# Patient Record
Sex: Female | Born: 1960
Health system: Southern US, Community
[De-identification: ages and names within clinical notes are randomized; demographics above are authoritative.]

## PROBLEM LIST (undated history)

## (undated) ENCOUNTER — Inpatient Hospital Stay (HOSPITAL_COMMUNITY): Payer: Self-pay

## (undated) DIAGNOSIS — T7840XA Allergy, unspecified, initial encounter: Secondary | ICD-10-CM

## (undated) DIAGNOSIS — Z9889 Other specified postprocedural states: Secondary | ICD-10-CM

## (undated) DIAGNOSIS — R7303 Prediabetes: Secondary | ICD-10-CM

## (undated) DIAGNOSIS — B009 Herpesviral infection, unspecified: Secondary | ICD-10-CM

## (undated) DIAGNOSIS — H9319 Tinnitus, unspecified ear: Secondary | ICD-10-CM

## (undated) DIAGNOSIS — R079 Chest pain, unspecified: Secondary | ICD-10-CM

## (undated) DIAGNOSIS — J302 Other seasonal allergic rhinitis: Secondary | ICD-10-CM

## (undated) DIAGNOSIS — E669 Obesity, unspecified: Secondary | ICD-10-CM

## (undated) DIAGNOSIS — Z8661 Personal history of infections of the central nervous system: Secondary | ICD-10-CM

## (undated) DIAGNOSIS — J45909 Unspecified asthma, uncomplicated: Secondary | ICD-10-CM

## (undated) DIAGNOSIS — M722 Plantar fascial fibromatosis: Secondary | ICD-10-CM

## (undated) DIAGNOSIS — R253 Fasciculation: Secondary | ICD-10-CM

## (undated) DIAGNOSIS — K8020 Calculus of gallbladder without cholecystitis without obstruction: Secondary | ICD-10-CM

## (undated) DIAGNOSIS — I493 Ventricular premature depolarization: Secondary | ICD-10-CM

## (undated) DIAGNOSIS — J45901 Unspecified asthma with (acute) exacerbation: Secondary | ICD-10-CM

## (undated) DIAGNOSIS — I10 Essential (primary) hypertension: Secondary | ICD-10-CM

## (undated) DIAGNOSIS — Z8619 Personal history of other infectious and parasitic diseases: Secondary | ICD-10-CM

## (undated) DIAGNOSIS — K859 Acute pancreatitis without necrosis or infection, unspecified: Secondary | ICD-10-CM

## (undated) DIAGNOSIS — M1990 Unspecified osteoarthritis, unspecified site: Secondary | ICD-10-CM

## (undated) DIAGNOSIS — K219 Gastro-esophageal reflux disease without esophagitis: Secondary | ICD-10-CM

## (undated) HISTORY — DX: Herpesviral infection, unspecified: B00.9

## (undated) HISTORY — DX: Prediabetes: R73.03

## (undated) HISTORY — DX: Gastro-esophageal reflux disease without esophagitis: K21.9

## (undated) HISTORY — DX: Tinnitus, unspecified ear: H93.19

## (undated) HISTORY — PX: INGUINAL HERNIA REPAIR: SUR1180

## (undated) HISTORY — DX: Acute pancreatitis without necrosis or infection, unspecified: K85.90

## (undated) HISTORY — DX: Unspecified asthma with (acute) exacerbation: J45.901

## (undated) HISTORY — DX: Plantar fascial fibromatosis: M72.2

## (undated) HISTORY — DX: Allergy, unspecified, initial encounter: T78.40XA

## (undated) HISTORY — DX: Unspecified osteoarthritis, unspecified site: M19.90

## (undated) HISTORY — DX: Fasciculation: R25.3

## (undated) HISTORY — DX: Personal history of infections of the central nervous system: Z86.61

## (undated) HISTORY — DX: Calculus of gallbladder without cholecystitis without obstruction: K80.20

## (undated) HISTORY — DX: Personal history of other infectious and parasitic diseases: Z86.19

## (undated) HISTORY — DX: Unspecified asthma, uncomplicated: J45.909

## (undated) HISTORY — PX: MOUTH SURGERY: SHX715

---
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
## 1990-08-31 HISTORY — PX: APPENDECTOMY: SHX54

## 1999-09-01 HISTORY — PX: DILATION AND CURETTAGE OF UTERUS: SHX78

## 2009-10-09 ENCOUNTER — Ambulatory Visit: Payer: Self-pay | Admitting: Internal Medicine

## 2009-10-09 DIAGNOSIS — I10 Essential (primary) hypertension: Secondary | ICD-10-CM | POA: Insufficient documentation

## 2009-10-09 DIAGNOSIS — H9319 Tinnitus, unspecified ear: Secondary | ICD-10-CM | POA: Insufficient documentation

## 2009-10-09 DIAGNOSIS — J45909 Unspecified asthma, uncomplicated: Secondary | ICD-10-CM | POA: Insufficient documentation

## 2009-10-09 DIAGNOSIS — O09529 Supervision of elderly multigravida, unspecified trimester: Secondary | ICD-10-CM | POA: Insufficient documentation

## 2009-10-09 DIAGNOSIS — J301 Allergic rhinitis due to pollen: Secondary | ICD-10-CM | POA: Insufficient documentation

## 2009-10-09 HISTORY — DX: Tinnitus, unspecified ear: H93.19

## 2009-10-09 HISTORY — DX: Unspecified asthma, uncomplicated: J45.909

## 2009-10-16 ENCOUNTER — Encounter (INDEPENDENT_AMBULATORY_CARE_PROVIDER_SITE_OTHER): Payer: Self-pay | Admitting: *Deleted

## 2009-11-22 ENCOUNTER — Ambulatory Visit: Payer: Self-pay | Admitting: Internal Medicine

## 2010-04-24 ENCOUNTER — Emergency Department (HOSPITAL_COMMUNITY): Admission: EM | Admit: 2010-04-24 | Discharge: 2010-04-25 | Payer: Self-pay | Admitting: Emergency Medicine

## 2010-05-20 ENCOUNTER — Ambulatory Visit: Payer: Self-pay | Admitting: Internal Medicine

## 2010-05-20 ENCOUNTER — Encounter (INDEPENDENT_AMBULATORY_CARE_PROVIDER_SITE_OTHER): Payer: Self-pay | Admitting: *Deleted

## 2010-05-20 ENCOUNTER — Telehealth: Payer: Self-pay | Admitting: Internal Medicine

## 2010-05-20 DIAGNOSIS — J209 Acute bronchitis, unspecified: Secondary | ICD-10-CM | POA: Insufficient documentation

## 2010-05-20 DIAGNOSIS — J45901 Unspecified asthma with (acute) exacerbation: Secondary | ICD-10-CM | POA: Insufficient documentation

## 2010-07-10 ENCOUNTER — Encounter: Payer: Self-pay | Admitting: Internal Medicine

## 2010-08-13 ENCOUNTER — Ambulatory Visit: Payer: Self-pay | Admitting: Internal Medicine

## 2010-08-13 ENCOUNTER — Encounter (INDEPENDENT_AMBULATORY_CARE_PROVIDER_SITE_OTHER): Payer: Self-pay | Admitting: *Deleted

## 2010-09-30 ENCOUNTER — Ambulatory Visit
Admission: RE | Admit: 2010-09-30 | Discharge: 2010-09-30 | Payer: Self-pay | Source: Home / Self Care | Attending: Internal Medicine | Admitting: Internal Medicine

## 2010-09-30 DIAGNOSIS — M722 Plantar fascial fibromatosis: Secondary | ICD-10-CM | POA: Insufficient documentation

## 2010-09-30 HISTORY — DX: Plantar fascial fibromatosis: M72.2

## 2010-09-30 NOTE — Letter (Signed)
Summary: Physical Statement & Health Status/[facility]  Physical Statement & Health Status/[facility]   Imported By: Sherian Rein 07/14/2010 11:40:11  _____________________________________________________________________  External Attachment:    Type:   Image     Comment:   External Document

## 2010-09-30 NOTE — Letter (Signed)
Summary: [facility]  [address] [place], [place] [address]   Phone: [phone]  Fax: [phone]    Today's Date: May 20, 2010  Name of Patient: Sheila Nelson  The above named patient had a medical visit today 05/20/10  Please take this into consideration when reviewing the time away from work  Special Instructions:  [  ] None    Sincerely yours,  Dr. Rene Paci

## 2010-09-30 NOTE — Assessment & Plan Note (Signed)
Summary: NEW / [employer] / # / CD   Vital Signs:  Patient profile:   50 year old female Height:      64 inches (162.56 cm) Weight:      206.8 pounds (94 kg) BMI:     35.63 O2 Sat:      97 % on Room air Temp:     98.3 degrees F (36.83 degrees C) oral Pulse rate:   78 / minute BP sitting:   112 / 80  (left arm) Cuff size:   large  Vitals Entered By: Orlan Leavens (October 09, 2009 3:07 PM)  O2 Flow:  Room air CC: New patient Is Patient Diabetic? No Pain Assessment Patient in pain? no        Primary Care Provider:  Newt Lukes MD  CC:  New patient.  History of Present Illness: new pt to me and our practice - here to est care  1) c/o recurrent bronchitis symptoms  occurs 2-3x/yr - requiring steroids and Alb MDI during flare last flare summer 2010 no daily wheezing or DOE/SOB  2) seasonal allg rhinitis - uses as needed claritin with good results characterized as head and nasla congestion  3) c/o tinnitus - occurs while trying to fall asleep only not unilateral and not pulsating occurs most nights of the week symptoms worse with stress generally triggered by work hx "early hearing loss" detected years ago at end of service duty with [employer] - no f/u since  4) ?gyn care- actively pursuing pregnancy at this time - ?need high risk care d/t AMA if successful  Preventive Screening-Counseling & Management  Alcohol-Tobacco     Alcohol drinks/day: <1     Alcohol Counseling: not indicated; use of alcohol is not excessive or problematic     Smoking Status: never     Tobacco Counseling: not indicated; no tobacco use  Caffeine-Diet-Exercise     Does Patient Exercise: yes     Exercise Counseling: to improve exercise regimen     Depression Counseling: not indicated; screening negative for depression  Safety-Violence-Falls     Seat Belt Use: yes     Firearms in the Home: no firearms in the home     Smoke Detectors: yes     Violence in the Home: no risk noted  Fall Risk Counseling: not indicated; no significant falls noted  Current Medications (verified): 1)  Multivitamins  Tabs (Multiple Vitamin) .... Once Daily 2)  Claritin 10 Mg Tabs (Loratadine) .... 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  The patient denies fever, decreased hearing, hoarseness, syncope, dyspnea on exertion, peripheral edema, and headaches.    Physical Exam  General:  overweight-appearing.  alert, well-developed, well-nourished, and cooperative to examination.    Eyes:  vision grossly intact; pupils equal, round and reactive to light.  conjunctiva and lids normal.    Ears:  normal pinnae  bilaterally, without erythema, swelling, or tenderness to palpation. TMs clear, without effusion, or cerumen impaction. Hearing grossly normal bilaterally  Mouth:  teeth and gums in good repair; mucous membranes moist, without lesions or ulcers. oropharynx clear without exudate, no erythema.  Lungs:  normal respiratory effort, no intercostal retractions or use of accessory muscles; normal breath sounds bilaterally - no crackles and no wheezes.    Heart:  normal rate, regular rhythm, no murmur, and no rub. BLE without edema. Psych:  Oriented X3, memory intact for recent and remote, normally interactive, good eye contact, not anxious  appearing, not depressed appearing, and not agitated.      Impression & Recommendations:  Problem # 1:  REACTIVE AIRWAY DISEASE (ICD-493.90) seasonal flares - usually triggered by infx - keep as needed MDI on hand and to call if problems Her updated medication list for this problem includes:    Xopenex Hfa 45 Mcg/act Aero (Levalbuterol tartrate) .Marland Kitchen... 1-2 inhalations every 4 hours as needed for shortness of breath  Problem # 2:  ALLERGIC RHINITIS, SEASONAL (ICD-477.0) cont as needed claritin - no acute issues  Problem # 3:  TINNITUS (ICD-388.30)  exam benign refer for audiology testing now - note hx "early hearing loss" years ago upon end of active duty with [employer]  Orders: Audiology (Audio)  Problem # 4:  other will refer to gyn for PAP/pelvic and review of ?fertility and possible high risk pregnancy as pt trying to become pregnant at this time  Complete Medication List: 1)  Multivitamins Tabs (Multiple vitamin) .... Once daily 2)  Claritin 10 Mg Tabs (Loratadine) .... Use prn 3)  Xopenex Hfa 45 Mcg/act Aero (Levalbuterol tartrate) .Marland Kitchen.. 1-2 inhalations every 4 hours as needed for shortness of breath  Other Orders: Gynecologic Referral (Gyn)  Patient Instructions: 1)  it was good to see you today. 2)  prescription for as needed xopenex MDI to have on hand - 3)  we'll make referral to gynecology and audiology. Our office will contact you regarding these appointments once made.  4)  Please schedule a follow-up appointment annually for medical physcial and labs, sooner if problems.  Prescriptions: XOPENEX HFA 45 MCG/ACT AERO (LEVALBUTEROL TARTRATE) 1-2 inhalations every 4 hours as needed for shortness of breath  #1 x 3   Entered and Authorized by:   Newt Lukes MD   Signed by:   Newt Lukes MD on 10/09/2009   Method used:   Print then Give to Patient   RxID:   [id number]    Pap Smear  Procedure date:  09/01/2007  Findings:       Interpretation/Result:Negative for intraepithelial Lesion or Malignancy.       Immunization History:  Tetanus/Td Immunization History:    Tetanus/Td:  historical (08/31/2008)  Influenza Immunization History:    Influenza:  historical (05/31/2009)

## 2010-09-30 NOTE — Progress Notes (Signed)
Summary: Appt  Phone Note Call from Patient   Caller: Patient [phone] Summary of Call: Pt called requesting appt for Asthma sxs. Please schedule pt with VAL tomorrow morning, Thanks. Initial call taken by: Margaret Pyle, CMA,  May 20, 2010 10:52 AM  Follow-up for Phone Call        Pt has schedule appt today @ 2:45pm Follow-up by: Orlan Leavens RMA,  May 20, 2010 11:15 AM

## 2010-09-30 NOTE — Letter (Signed)
Summary: [facility] Consult Scheduled Letter  [name]  [address] [place], [place] [address]   Phone: [phone]  Fax: [phone]      10/16/2009 MRN: [id number]  Sheila Nelson [address] [place], [place]  [address]    Dear Ms. [name],      We have scheduled an appointment for you.  At the recommendation of Dr.Leschber, we have scheduled you a consult with Dr Billy Coast on 11/14/09 at 2:00pm.  Their phone number is [phone].  If this appointment day and time is not convenient for you, please feel free to call the office of the doctor you are being referred to at the number listed above and reschedule the appointment.    [facility] [address] [place], [place] [address]    Thank you,  Patient Care Coordinator [facility]

## 2010-09-30 NOTE — Assessment & Plan Note (Signed)
Summary: asthma,cough cold/cd   Vital Signs:  Patient profile:   50 year old female Height:      64 inches (162.56 cm) Weight:      206 pounds (93.64 kg) O2 Sat:      96 % on Room air Temp:     98.7 degrees F (37.06 degrees C) oral Pulse rate:   86 / minute BP sitting:   102 / 84  (left arm) Cuff size:   large  Vitals Entered By: Orlan Leavens RMA (May 20, 2010 2:50 PM)  O2 Flow:  Room air CC: ? Asthma/ cold sxs Is Patient Diabetic? No Pain Assessment Patient in pain? no        Primary Care Provider:  Newt Lukes MD  CC:  ? Asthma/ cold sxs.  History of Present Illness: here for asthma flare onset 4 days ago - started scratch in throat - progressed into PND and cough now a/w chest tightness- improved but not resolved with xopinex mdi ?other suppressive allg/asthma med - denies wheeze but +productive sputum with cough no fever but +sweats and chills hx same - last URI 6 weeks ago  reviewed prior OV 10/2009: 1) c/o recurrent bronchitis symptoms  occurs 2-3x/yr - requiring steroids and Alb MDI during flare last flare summer 2010 no daily wheezing or DOE/SOB  2) seasonal allg rhinitis - uses as needed claritin with good results characterized as head and nasal congestion  3) c/o tinnitus - occurs while trying to fall asleep only not unilateral and not pulsating occurs most nights of the week symptoms worse with stress generally triggered by work hx "early hearing loss" detected years ago at end of service duty with [employer] - no f/u since  4) ?gyn care- actively pursuing pregnancy at this time - ?need high risk care d/t AMA if successful  Clinical Review Panels:  Immunizations   Last Tetanus Booster:  Historical (08/31/2008)   Last Flu Vaccine:  Historical (05/31/2009)   Current Medications (verified): 1)  Multivitamins  Tabs (Multiple Vitamin) .... Once Daily 2)  Claritin 10 Mg Tabs (Loratadine) .... Use Prn 3)  Xopenex Hfa 45 Mcg/act Aero  (Levalbuterol Tartrate) .Marland Kitchen.. 1-2 Inhalations Every 4 Hours As Needed For Shortness of Breath  Allergies (verified): No Known Drug Allergies  Past History:  Past Medical History: hypertension asthma allergic rhinitis  Social History: Never Smoked social alcohol - wine lives with finance - wedding 07/2010 planned employed as RN - prev pediatric critical care; now [employer]  Review of Systems  The patient denies fever and hemoptysis.    Physical Exam  General:  overweight-appearing.  alert, well-developed, well-nourished, and cooperative to examination.   mod ill appearing Eyes:  vision grossly intact; pupils equal, round and reactive to light.  conjunctiva and lids normal.    Ears:  normal pinnae bilaterally, without erythema, swelling, or tenderness to palpation. TMs clear, without effusion, or cerumen impaction. Hearing grossly normal bilaterally  Mouth:  teeth and gums in good repair; mucous membranes moist, without lesions or ulcers. oropharynx clear without exudate, mod erythema. clear PND Lungs:  normal respiratory effort, no intercostal retractions or use of accessory muscles; normal breath sounds bilaterally - no crackles and min end exp wheezes.    Heart:  normal rate, regular rhythm, no murmur, and no rub. BLE without edema.   Impression & Recommendations:  Problem # 1:  ASTHMA UNSPECIFIED WITH EXACERBATION (ICD-493.92)  tx acute flare with zpack for bronchitis and steroids add singular - if  cont symptoms after resolution of acute infx, consider PFTs to eval need for inhaled steroid or other pulm tx  Her updated medication list for this problem includes:    Xopenex Hfa 45 Mcg/act Aero (Levalbuterol tartrate) .Marland Kitchen... 1-2 inhalations every 4 hours as needed for shortness of breath    Prednisone (pak) 10 Mg Tabs (Prednisone) .Marland Kitchen... As directed x 6 days    Singulair 10 Mg Tabs (Montelukast sodium) .Marland Kitchen... 1 by mouth once daily  Pulmonary Functions Reviewed: O2 sat: 96  (05/20/2010)  Orders: Prescription Created Electronically [phone])  Problem # 2:  ACUTE BRONCHITIS (ICD-466.0)  Her updated medication list for this problem includes:    Xopenex Hfa 45 Mcg/act Aero (Levalbuterol tartrate) .Marland Kitchen... 1-2 inhalations every 4 hours as needed for shortness of breath    Azithromycin 250 Mg Tabs (Azithromycin) .Marland Kitchen... 2 tabs by mouth today, then 1 by mouth daily starting tomorrow    Singulair 10 Mg Tabs (Montelukast sodium) .Marland Kitchen... 1 by mouth once daily    Tussionex Pennkinetic Er 10-8 Mg/16ml Lqcr (Hydrocod polst-chlorphen polst) .Marland KitchenMarland KitchenMarland KitchenMarland Kitchen 5 cc by mouth every 12h as needed for cough  Take antibiotics and other medications as directed. Encouraged to push clear liquids, get enough rest, and take acetaminophen as needed. To be seen in 5-7 days if no improvement, sooner if worse.  Orders: Prescription Created Electronically [phone])  Complete Medication List: 1)  Claritin 10 Mg Tabs (Loratadine) .... Use prn 2)  Xopenex Hfa 45 Mcg/act Aero (Levalbuterol tartrate) .Marland Kitchen.. 1-2 inhalations every 4 hours as needed for shortness of breath 3)  Azithromycin 250 Mg Tabs (Azithromycin) .... 2 tabs by mouth today, then 1 by mouth daily starting tomorrow 4)  Prednisone (pak) 10 Mg Tabs (Prednisone) .... As directed x 6 days 5)  Singulair 10 Mg Tabs (Montelukast sodium) .Marland Kitchen.. 1 by mouth once daily 6)  Tussionex Pennkinetic Er 10-8 Mg/37ml Lqcr (Hydrocod polst-chlorphen polst) .... 5 cc by mouth every 12h as needed for cough 7)  Mupirocin 2 % Oint (Mupirocin) .... Apply two times a day as needed  Patient Instructions: 1)  it was good to see you today. 2)  Zpack and pred pack + tussionex for acute bronchitis symptoms as discussed - 3)  add singular once daily for asthma/allergy symptoms  4)  continue xopinex as needed 5)  Get plenty of rest, drink lots of clear liquids, and use Tylenol or Ibuprofen for fever and comfort. Return in 7-10 days if you're not better:sooner if you're feeling  worse. 6)  work note for today as requested 7)  Please schedule a follow-up appointment in 3 months to review allergy and asthma control, call sooner if problems.  Prescriptions: XOPENEX HFA 45 MCG/ACT AERO (LEVALBUTEROL TARTRATE) 1-2 inhalations every 4 hours as needed for shortness of breath  #1 x 3   Entered and Authorized by:   Newt Lukes MD   Signed by:   Newt Lukes MD on 05/20/2010   Method used:   Electronically to        [facility]* (retail)       [address]       [place], [place]  [address]       Ph: [phone]       Fax: [phone]   RxID:   [id number] BACTROBAN 2 % CREA (MUPIROCIN CALCIUM) apply to affected skin two times a day as needed  #1 x 0   Entered and Authorized by:   Newt Lukes MD   Signed  by:   Newt Lukes MD on 05/20/2010   Method used:   Electronically to        [facility]* (retail)       [address]       [place], [place]  [id number]       Ph: [phone]       Fax: [phone]   RxID:   [id number] Sandria Senter ER 10-8 MG/5ML LQCR (HYDROCOD POLST-CHLORPHEN POLST) 5 cc by mouth every 12h as needed for cough  #6 oz x 0   Entered and Authorized by:   Newt Lukes MD   Signed by:   Newt Lukes MD on 05/20/2010   Method used:   Printed then faxed to ...       [facility]* (retail)       [address]       [place], [place]  [address]       Ph: [phone]       Fax: [phone]   RxID:   [id number] SINGULAIR 10 MG TABS (MONTELUKAST SODIUM) 1 by mouth once daily  #30 x 6   Entered and Authorized by:   Newt Lukes MD   Signed by:   Newt Lukes MD on 05/20/2010   Method used:   Electronically to        [facility]* (retail)       [address]       [place], [place]  [address]       Ph: [phone]       Fax: [phone]   RxID:   [id number] PREDNISONE (PAK) 10 MG TABS (PREDNISONE) as directed x 6  days  #1 x 0   Entered and Authorized by:   Newt Lukes MD   Signed by:   Newt Lukes MD on 05/20/2010   Method used:   Electronically to        [facility]* (retail)       [address]       [place], [place]  [address]       Ph: [phone]       Fax: [phone]   RxID:   [id number] AZITHROMYCIN 250 MG TABS (AZITHROMYCIN) 2 tabs by mouth today, then 1 by mouth daily starting tomorrow  #6 x 0   Entered and Authorized by:   Newt Lukes MD   Signed by:   Newt Lukes MD on 05/20/2010   Method used:   Electronically to        [facility]* (retail)       [address]       [place], [place]  [address]       Ph: [phone]       Fax: [phone]   RxID:   [id number]

## 2010-09-30 NOTE — Progress Notes (Signed)
Summary: med change  ---- Converted from flag ---- ---- 05/20/2010 3:43 PM, Verdell Face wrote: Valentina Gu,  Pharmacist can save pt $40 if cream just prescribed can be changed to  ointment instead of cream - [phone] Nehemiah Settle at [facility].   Elnita Maxwell ------------------------------  Phone Note Call from Patient   Summary of Call: ok to change to ointment - erx done Initial call taken by: Newt Lukes MD,  May 20, 2010 4:13 PM  Follow-up for Phone Call        notified pt &  pharm rx sent e-script Follow-up by: Orlan Leavens RMA,  May 20, 2010 4:24 PM    New/Updated Medications: MUPIROCIN 2 % OINT (MUPIROCIN) apply two times a day as needed Prescriptions: MUPIROCIN 2 % OINT (MUPIROCIN) apply two times a day as needed  #1 x 0   Entered and Authorized by:   Newt Lukes MD   Signed by:   Newt Lukes MD on 05/20/2010   Method used:   Electronically to        [facility]* (retail)       [address]       [place], [place]  [address]       Ph: [phone]       Fax: [phone]   RxID:   [id number]

## 2010-10-02 NOTE — Assessment & Plan Note (Signed)
Summary: COUGH-CONGESTION--STC   Vital Signs:  Patient profile:   50 year old female Height:      64 inches (162.56 cm) Weight:      206 pounds (93.64 kg) BMI:     35.49 O2 Sat:      97 % on Room air Temp:     98.8 degrees F (37.11 degrees C) oral Pulse rate:   72 / minute BP sitting:   112 / 74  (left arm) Cuff size:   large  Vitals Entered By: Orlan Leavens RMA (August 13, 2010 1:59 PM)  O2 Flow:  Room air CC: Cough/ congestion Is Patient Diabetic? No Pain Assessment Patient in pain? no      Comments Pt states been taking mucinex & ibuprofen ovc   Primary Care Provider:  Newt Lukes MD  CC:  Cough/ congestion.  History of Present Illness: here for asthma flare onset 6 days ago - started scratch in throat and LGF - progressed into PND and cough now a/w chest tightness- improved but not resolved with xopinex mdi, musincex and tussionex +productive sputum with cough no fever but +sweats and chills  reviewed prior OV 10/2009: 1) recurrent bronchitis symptoms  occurs 2-3x/yr - requiring steroids and Alb MDI during flare no daily wheezing or DOE/SOB  2) seasonal allg rhinitis - uses as needed claritin with good results characterized as head and nasal congestion  3) tinnitus - occurs while trying to fall asleep only not unilateral and not pulsating occurs most nights of the week symptoms worse with stress generally triggered by work hx "early hearing loss" detected years ago at end of service duty with [employer] - no f/u since  4) ?gyn care- actively pursuing pregnancy at this time - ?need high risk care d/t AMA if successful  Current Medications (verified): 1)  Claritin 10 Mg Tabs (Loratadine) .... Use Prn 2)  Xopenex Hfa 45 Mcg/act Aero (Levalbuterol Tartrate) .Marland Kitchen.. 1-2 Inhalations Every 4 Hours As Needed For Shortness of Breath 3)  Singulair 10 Mg Tabs (Montelukast Sodium) .Marland Kitchen.. 1 By Mouth Once Daily 4)  Tussionex Pennkinetic Er 10-8 Mg/48ml Lqcr  (Hydrocod Polst-Chlorphen Polst) .... 5 Cc By Mouth Every 12h As Needed For Cough 5)  Mupirocin 2 % Oint (Mupirocin) .... 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    Physical Exam  General:  overweight-appearing.  alert, well-developed, well-nourished, and cooperative to examination.   mod ill appearing Eyes:  vision grossly intact; pupils equal, round and reactive to light.  conjunctiva and lids normal.    Ears:  normal pinnae bilaterally, without erythema, swelling, or tenderness to palpation. TMs clear, without effusion, or cerumen impaction. Hearing grossly normal bilaterally  Mouth:  teeth and gums in good repair; mucous membranes moist, without lesions or ulcers. oropharynx clear without exudate, mod erythema. clear PND Lungs:  normal respiratory effort, no intercostal retractions or use of accessory muscles; rhonchi breath sounds bilaterally - no crackles and min end exp wheezes.    Heart:  normal rate, regular rhythm, no murmur, and no rub. BLE without edema.   Impression & Recommendations:  Problem # 1:  ACUTE BRONCHITIS (ICD-466.0)  Her updated medication list for this problem includes:    Xopenex Hfa 45 Mcg/act Aero (Levalbuterol tartrate) .Marland Kitchen... 1-2 inhalations every 4 hours as needed for shortness of breath    Singulair 10 Mg Tabs (Montelukast sodium) .Marland Kitchen... 1 by mouth once daily    Tussionex Pennkinetic Er 10-8 Mg/22ml Lqcr (Hydrocod polst-chlorphen polst) .Marland KitchenMarland KitchenMarland KitchenMarland Kitchen 5 cc by mouth every 12h as needed for cough    Doxycycline Hyclate 100 Mg Tabs (Doxycycline hyclate) .Marland Kitchen... 1 by mouth two times a day x 7days     Tessalon Perles 100 Mg Caps (Benzonatate) .Marland Kitchen... 1 by mouth three times a day  x 5 days, then as needed for cough symptoms  Take antibiotics and other medications as directed. Encouraged to push clear liquids, get enough rest, and take acetaminophen as needed. To be seen in 5-7 days if no improvement, sooner if worse.  Orders: Prescription Created Electronically [phone])  Problem # 2:  ASTHMA UNSPECIFIED WITH EXACERBATION (ICD-493.92)  Her updated medication list for this problem includes:    Xopenex Hfa 45 Mcg/act Aero (Levalbuterol tartrate) .Marland Kitchen... 1-2 inhalations every 4 hours as needed for shortness of breath    Singulair 10 Mg Tabs (Montelukast sodium) .Marland Kitchen... 1 by mouth once daily    Prednisone (pak) 10 Mg Tabs (Prednisone) .Marland Kitchen... As directed x 6days  tx acute flare with abx for bronchitis and steroids + antitussives cont singular - if cont symptoms after resolution of acute infx, consider PFTs to eval need for inhaled steroid or other pulm tx  Her updated medication list for this problem includes:    Xopenex Hfa 45 Mcg/act Aero (Levalbuterol tartrate) .Marland Kitchen... 1-2 inhalations every 4 hours as needed for shortness of breath    Prednisone (pak) 10 Mg Tabs (Prednisone) .Marland Kitchen... As directed x 6 days    Singulair 10 Mg Tabs (Montelukast sodium) .Marland Kitchen... 1 by mouth once daily  Pulmonary Functions Reviewed: O2 sat: 96 (05/20/2010)  Orders: Prescription Created Electronically [phone])  Complete Medication List: 1)  Claritin 10 Mg Tabs (Loratadine) .... Use prn 2)  Xopenex Hfa 45 Mcg/act Aero (Levalbuterol tartrate) .Marland Kitchen.. 1-2 inhalations every 4 hours as needed for shortness of breath 3)  Singulair 10 Mg Tabs (Montelukast sodium) .Marland Kitchen.. 1 by mouth once daily 4)  Tussionex Pennkinetic Er 10-8 Mg/47ml Lqcr (Hydrocod polst-chlorphen polst) .... 5 cc by mouth every 12h as needed for cough 5)  Mupirocin 2 % Oint (Mupirocin) .... Apply two times a day as needed 6)  Doxycycline Hyclate 100 Mg Tabs (Doxycycline  hyclate) .Marland Kitchen.. 1 by mouth two times a day x 7days 7)  Prednisone (pak) 10 Mg Tabs (Prednisone) .... As directed x 6days 8)  Tessalon Perles 100 Mg Caps (Benzonatate) .Marland Kitchen.. 1 by mouth three times a day  x 5 days, then as needed for cough symptoms  Patient Instructions: 1)  it was good to see you today. 2)  doxy and pred pack + tessalon for acute bronchitis symptoms as discussed - 3)  continue singular once daily for asthma/allergy symptoms  4)  continue xopinex as needed 5)  Get plenty of rest, drink lots of clear liquids, and use Tylenol or Ibuprofen for fever and comfort. Return in 7-10 days if you're not better:sooner if you're feeling worse. 6)  work note provided today as requested Prescriptions: TESSALON PERLES 100 MG CAPS (BENZONATATE) 1 by mouth three times a day  x 5 days, then as needed for cough symptoms  #30 x 0   Entered and Authorized by:   Vikki Ports  Edsel Petrin MD   Signed by:   Newt Lukes MD on 08/13/2010   Method used:   Electronically to        [facility]* (retail)       [address]       [place], [place]  [address]       Ph: [phone]       Fax: [phone]   RxID:   [id number] PREDNISONE (PAK) 10 MG TABS (PREDNISONE) as directed x 6days  #1 x 0   Entered and Authorized by:   Newt Lukes MD   Signed by:   Newt Lukes MD on 08/13/2010   Method used:   Electronically to        [facility]* (retail)       [address]       [place], [place]  [id number]       Ph: [phone]       Fax: [phone]   RxID:   [id number] DOXYCYCLINE HYCLATE 100 MG TABS (DOXYCYCLINE HYCLATE) 1 by mouth two times a day x 7days  #14 x 0   Entered and Authorized by:   Newt Lukes MD   Signed by:   Newt Lukes MD on 08/13/2010   Method used:   Electronically to        [facility]* (retail)       [address]       [place], [place]  [address]       Ph: [phone]       Fax: [phone]    RxID:   [id number]    Orders Added: 1)  Est. Patient Level IV [62694] 2)  Prescription Created Electronically [phone]

## 2010-10-02 NOTE — Letter (Signed)
Summary: Out of Work  [facility]  [address] [place], [place] [address]   Phone: [phone]  Fax: [phone]    August 13, 2010   Employee:  Raela Wren    To Whom It May Concern:   For Medical reasons, please excuse the above named employee from work for the following dates:  Start: 08/07/10    End: 08/17/10    If you need additional information, please feel free to contact our office.         Sincerely,    Dr. Rene Paci

## 2010-10-08 NOTE — Assessment & Plan Note (Signed)
Summary: FOOT PAIN/NWS   Vital Signs:  Patient profile:   50 year old female Height:      64 inches Weight:      217.25 pounds BMI:     37.43 O2 Sat:      95 % on Room air Temp:     97.7 degrees F oral Pulse rate:   90 / minute BP sitting:   100 / 68  (right arm) Cuff size:   large  Vitals Entered By: Zella Ball Ewing CMA Duncan Dull) (September 30, 2010 2:43 PM)  O2 Flow:  Room air CC: Right foot pain/RE   Primary Care Provider:  Newt Lukes MD  CC:  Right foot pain/RE.  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  Has an out or state podiatrist friend who suggested presentation today to consider hard boot for the foot and ankle for support with walking adn standing, but also local podiatry referral;  also remembers an episode of asthma tx with oral prednisone which incidently helped her plantar fasciits at that time.  Has not seen podiatry in the past or ortho but hasnt been that bad in the past.  No recent trauma, fever, swelling , hx of gout or hx of specific ankle issues such as DJD or tendon issue.  Pt denies CP, worsening sob, doe, wheezing, orthopnea, pnd, worsening LE edema, palps, dizziness or syncope  Pt denies polydipsia, polyuria.  Overall good compliance with meds,  little excercise however   Preventive Screening-Counseling & Management      Drug Use:  no.    Problems Prior to Update: 1)  Plantar Fasciitis, Right  (ICD-728.71) 2)  Acute Bronchitis  (ICD-466.0) 3)  Asthma Unspecified With Exacerbation  (ICD-493.92) 4)  Advanced Maternal Age  (ICD-659.60) 5)  Tinnitus  (ICD-388.30) 6)  Allergic Rhinitis, Seasonal  (ICD-477.0) 7)  Reactive Airway Disease   (ICD-493.90) 8)  Chickenpox, Hx of  (ICD-V15.9) 9)  Hypertension  (ICD-401.9)  Medications Prior to Update: 1)  Claritin 10 Mg Tabs (Loratadine) .... Use Prn 2)  Xopenex Hfa 45 Mcg/act Aero (Levalbuterol Tartrate) .Marland Kitchen.. 1-2 Inhalations Every 4 Hours As Needed For Shortness of Breath 3)  Singulair 10 Mg Tabs (Montelukast Sodium) .Marland Kitchen.. 1 By Mouth Once Daily 4)  Tussionex Pennkinetic Er 10-8 Mg/29ml Lqcr (Hydrocod Polst-Chlorphen Polst) .... 5 Cc By Mouth Every 12h As Needed For Cough 5)  Mupirocin 2 % Oint (Mupirocin) .... Apply Two Times A Day As Needed 6)  Tessalon Perles 100 Mg Caps (Benzonatate) .Marland Kitchen.. 1 By Mouth Three Times A Day  X 5 Days, Then As Needed For Cough Symptoms  Current Medications (verified): 1)  Claritin 10 Mg Tabs (Loratadine) .... Use Prn 2)  Xopenex Hfa 45 Mcg/act Aero (Levalbuterol Tartrate) .Marland Kitchen.. 1-2 Inhalations Every 4 Hours As Needed For Shortness of Breath 3)  Singulair 10 Mg Tabs (Montelukast Sodium) .Marland Kitchen.. 1 By Mouth Once Daily 4)  Tussionex Pennkinetic Er 10-8 Mg/65ml Lqcr (Hydrocod Polst-Chlorphen Polst) .... 5 Cc By Mouth Every 12h As Needed For Cough 5)  Mupirocin 2 % Oint (Mupirocin) .... Apply Two Times A Day As Needed 6)  Tessalon Perles 100 Mg Caps (  Benzonatate) .Marland Kitchen.. 1 By Mouth Three Times A Day  X 5 Days, Then As Needed For Cough Symptoms 7)  Right Foot and Ankle Boot/immobilizer .... Use Asd 8)  Prednisone 10 Mg Tabs (Prednisone) .... 3po Qd For 3days, Then 2po Qd For 3days, Then 1po Qd For 3days, Then Stop 9)  Tramadol Hcl 50 Mg Tabs (Tramadol Hcl) .Marland Kitchen.. 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   Head:  normocephalic and atraumatic.   Eyes:  vision grossly intact, pupils equal, and pupils round.   Ears:  R ear normal and L ear normal.   Nose:  no external deformity and no nasal discharge.   Mouth:  no gingival abnormalities and pharynx pink and moist.   Neck:  supple and no masses.   Lungs:  normal respiratory effort and normal breath sounds.   Heart:  normal rate and regular rhythm.   Msk:  marked tender right instep and heel, wtihout swelling, erythema, ulcer Pulses:  1+ bilat dorsalis pedis Extremities:  no edema, no erythema    Impression & Recommendations:  Problem # 1:  PLANTAR FASCIITIS, RIGHT (ICD-728.71) mod to severe flare  - tx with predpack, rx given for boot, pain med and refer podiatry  - likely should have cortisone inj, even casting if needed, also advised wt loss  Orders: Podiatry Referral (Podiatry)  Problem # 2:  HYPERTENSION (ICD-401.9) stable overall by hx and exam, ok to continue meds/tx as is  - no meds needed despite wt gain BP today: 100/68 Prior BP: 112/74 (08/13/2010)  Problem # 3:  ASTHMA (ICD-493.90)  Her updated medication list for this problem includes:    Xopenex Hfa 45 Mcg/act Aero (Levalbuterol tartrate) .Marland Kitchen... 1-2 inhalations every 4 hours as needed for shortness of breath    Singulair 10 Mg Tabs (Montelukast sodium) .Marland Kitchen... 1 by mouth once daily    Prednisone 10 Mg Tabs (Prednisone) .Marland Kitchen... 3po qd for 3days, then 2po qd for 3days,  then 1po qd for 3days, then stop stable overall by hx and exam, ok to continue meds/tx as is   Complete Medication List: 1)  Claritin 10 Mg Tabs (Loratadine) .... Use prn 2)  Xopenex Hfa 45 Mcg/act Aero (Levalbuterol tartrate) .Marland Kitchen.. 1-2 inhalations every 4 hours as needed for shortness of breath 3)  Singulair 10 Mg Tabs (Montelukast sodium) .Marland Kitchen.. 1 by mouth once daily 4)  Tussionex Pennkinetic Er 10-8 Mg/26ml Lqcr (Hydrocod polst-chlorphen polst) .... 5 cc by mouth every 12h as needed for cough 5)  Mupirocin 2 % Oint (Mupirocin) .... Apply two times a day as needed 6)  Tessalon Perles 100 Mg Caps (Benzonatate) .Marland Kitchen.. 1 by mouth  three times a day  x 5 days, then as needed for cough symptoms 7)  Right Foot and Ankle Boot/immobilizer  .... Use asd 8)  Prednisone 10 Mg Tabs (Prednisone) .... 3po qd for 3days, then 2po qd for 3days, then 1po qd for 3days, then stop 9)  Tramadol Hcl 50 Mg Tabs (Tramadol hcl) .Marland Kitchen.. 1 by mouth q 6 hrs as needed  Patient Instructions: 1)  Please take all new medications as prescribed 2)  Continue all previous medications as before this visit  3)  You are given the prescription for the boot 4)  You will be contacted about the referral(s) to: podiatry 5)  Please schedule an appointment with your primary doctor as needed Prescriptions: TRAMADOL HCL 50 MG TABS (TRAMADOL HCL) 1 by mouth q 6 hrs as needed  #60 x 1   Entered and Authorized by:   Corwin Levins MD   Signed by:   Corwin Levins MD on 09/30/2010   Method used:   Print then Give to Patient   RxID:   [id number] PREDNISONE 10 MG TABS (PREDNISONE) 3po qd for 3days, then 2po qd for 3days, then 1po qd for 3days, then stop  #18 x 0   Entered and Authorized by:   Corwin Levins MD   Signed by:   Corwin Levins MD on 09/30/2010   Method used:   Print then Give to Patient   RxID:   [id number] RIGHT FOOT AND ANKLE BOOT/IMMOBILIZER use asd  #1 x 0   Entered and Authorized by:   Corwin Levins MD   Signed by:   Corwin Levins MD on 09/30/2010   Method used:   Print then Give to Patient   RxID:   [id number]    Orders Added: 1)  Podiatry Referral [Podiatry] 2)  Est. Patient Level IV [01027]

## 2010-10-23 ENCOUNTER — Encounter (INDEPENDENT_AMBULATORY_CARE_PROVIDER_SITE_OTHER): Payer: Self-pay | Admitting: *Deleted

## 2010-10-23 ENCOUNTER — Ambulatory Visit (INDEPENDENT_AMBULATORY_CARE_PROVIDER_SITE_OTHER): Payer: PRIVATE HEALTH INSURANCE | Admitting: Internal Medicine

## 2010-10-23 ENCOUNTER — Encounter: Payer: Self-pay | Admitting: Internal Medicine

## 2010-10-23 ENCOUNTER — Other Ambulatory Visit: Payer: Self-pay | Admitting: Internal Medicine

## 2010-10-23 ENCOUNTER — Ambulatory Visit (INDEPENDENT_AMBULATORY_CARE_PROVIDER_SITE_OTHER)
Admission: RE | Admit: 2010-10-23 | Discharge: 2010-10-23 | Disposition: A | Discharge status: Home / Self Care | Payer: PRIVATE HEALTH INSURANCE | Source: Ambulatory Visit | Attending: Internal Medicine | Admitting: Internal Medicine

## 2010-10-23 DIAGNOSIS — M722 Plantar fascial fibromatosis: Secondary | ICD-10-CM

## 2010-10-24 ENCOUNTER — Telehealth: Payer: Self-pay | Admitting: Internal Medicine

## 2010-10-27 ENCOUNTER — Encounter: Payer: Self-pay | Admitting: Internal Medicine

## 2010-10-28 NOTE — Assessment & Plan Note (Signed)
Summary: FOOT PAIN / WANTS AN X-RAY /NWS   Vital Signs:  Patient profile:   50 year old female O2 Sat:      97 % on Room air Temp:     97.6 degrees F (36.44 degrees C) oral Pulse rate:   77 / minute BP sitting:   112 / 82  (left arm) Cuff size:   large  Vitals Entered By: Orlan Leavens RMA (October 23, 2010 11:42 AM)  O2 Flow:  Room air CC: Ongoing (R) foot pain Is Patient Diabetic? No Pain Assessment Patient in pain? yes     Location: (R) foot Type: aching   Primary Care Provider:  Newt Lukes MD  CC:  Ongoing (R) foot pain.  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 Sx not improved with hard boot, ice rolling, night splint and oral pain meds improved with pred briefly - now worse again pending local podiatry next week - never seen by ortho for same No trauma, fever, but progressive swelling no hx of gout or hx of specific ankle issues such as DJD or tendon issue.   Pt denies CP, worsening sob, doe, wheezing, orthopnea, pnd, worsening LE edema, palps, dizziness or syncope  Pt denies polydipsia, polyuria.  Overall good compliance with meds,  little excercise however   Current Medications (verified): 1)  Claritin 10 Mg Tabs (Loratadine) .... Use Prn 2)  Xopenex Hfa 45 Mcg/act Aero (Levalbuterol Tartrate) .Marland Kitchen.. 1-2 Inhalations Every 4 Hours As Needed For Shortness of Breath 3)  Singulair 10 Mg Tabs (Montelukast Sodium) .Marland Kitchen.. 1 By Mouth Once Daily 4)  Tussionex Pennkinetic Er 10-8 Mg/3ml Lqcr (Hydrocod Polst-Chlorphen Polst) .... 5 Cc By Mouth Every 12h As Needed For Cough 5)  Mupirocin 2 % Oint (Mupirocin) .... Apply Two Times A Day As Needed 6)  Right Foot and Ankle Boot/immobilizer ....  Use Asd 7)  Tramadol Hcl 50 Mg Tabs (Tramadol Hcl) .Marland Kitchen.. 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  The patient denies chest pain and headaches.    Physical Exam  General:  alert and overweight-appearing.  uncomfortable with pain Msk:  marked tenderness and swelling right instep and heel, wtihout erythema, bruising, ulcer or skin compromise - FROM at ankle w/o pain - neurovasc intact Skin:  no rashes, vesicles, ulcers, or erythema. No nodules or irregularity to palpation.    Impression & Recommendations:  Problem # 1:  PLANTAR FASCIITIS, RIGHT (ICD-728.71) seen here for same - improved breif with pred pak - but now worse again progressive swelling and difficult ambulation ongoing conserv care not helping course -- check xray r/o FB, add voltaren gel and refer to ortho to consider steroid injection or other therapy Orders: Prescription Created Electronically [phone]) T-Foot Right ([id number]) Orthopedic Surgeon Referral (Ortho Surgeon)  Complete Medication List: 1)  Claritin 10 Mg Tabs (Loratadine) .... Use prn 2)  Xopenex Hfa 45 Mcg/act Aero (Levalbuterol tartrate) .Marland Kitchen.. 1-2 inhalations every 4 hours as needed for shortness of breath 3)  Singulair 10 Mg Tabs (Montelukast sodium) .Marland Kitchen.. 1 by mouth once daily 4)  Tussionex Pennkinetic Er 10-8 Mg/85ml Lqcr (Hydrocod polst-chlorphen polst) .... 5 cc by mouth every 12h as needed for cough 5)  Mupirocin 2 % Oint (Mupirocin) .... Apply two times a day as needed 6)  Right Foot and Ankle Boot/immobilizer  .... Use asd 7)  Tramadol Hcl 50 Mg Tabs (Tramadol hcl) .Marland Kitchen.. 1 by  mouth q 6 hrs as needed 8)  Voltaren 1 % Gel (Diclofenac sodium) .... Apply to affected area four times a day as needed for pain  Patient Instructions: 1)  it was good to see you today. 2)  xray ordered today - your results will becalled to you after review 3)  use voltaren gel to plantar fascia pain four times a day as needed in addition to ongoing treatment as reveiewed today - your prescriptions have been electronically submitted to your pharmacy. Please take as directed. Contact our office if you believe you're having problems with the medication(s).  4)  work note x 1 week to avoid standing/walking - i do not recommend going to [place] this weekend  5)  we'll make referral to orthopedics ASAP. Our office will contact you regarding this appointment once made.  Prescriptions: VOLTAREN 1 % GEL (DICLOFENAC SODIUM) apply to affected area four times a day as needed for pain  #1 x 1   Entered and Authorized by:   Newt Lukes MD   Signed by:   Newt Lukes MD on 10/23/2010   Method used:   Electronically to        [facility]* (retail)       [address]       [place], [place]  [address]       Ph: [phone]       Fax: [phone]   RxID:   [id number]    Orders Added: 1)  Est. Patient Level IV [72536] 2)  Prescription Created Electronically [[id number]] 3)  T-Foot Right [73630TC] 4)  Orthopedic Surgeon Referral [Ortho Surgeon]

## 2010-10-28 NOTE — Letter (Signed)
Summary: Out of Work  [facility]  [address] [place], [place] [address]   Phone: [phone]  Fax: [phone]    October 23, 2010   Employee:  Nicoya Gadomski    To Whom It May Concern:   For Medical reasons, please excuse the above named employee from work for the following dates:  Start: 10/23/10    End: 10/30/10, Mayn return to work on Friday 10/31/10  If you need additional information, please feel free to contact our office.         Sincerely,    Dr. Rene Paci

## 2010-10-28 NOTE — Progress Notes (Signed)
Summary: med change  Phone Note From Pharmacy   Caller: [facility]* Call For: Dr. Felicity Coyer  Reason for Call: Needs renewal Request: Speak with Provider Summary of Call: Recieved fax from [facility] stating Voltaren 1% Gel is unavailable with no release date. Recommending md to change to something else. Pls advise Initial call taken by: Orlan Leavens RMA,  October 24, 2010 10:24 AM  Follow-up for Phone Call        change to flector patch - erx done Follow-up by: Newt Lukes MD,  October 24, 2010 10:47 AM  Additional Follow-up for Phone Call Additional follow up Details #1::        Notified pharm md sent new rx electronically Additional Follow-up by: Orlan Leavens RMA,  October 24, 2010 11:03 AM    New/Updated Medications: FLECTOR 1.3 % PTCH (DICLOFENAC EPOLAMINE) apply to affected area two times a day Prescriptions: FLECTOR 1.3 % PTCH (DICLOFENAC EPOLAMINE) apply to affected area two times a day  #20 x 0   Entered and Authorized by:   Newt Lukes MD   Signed by:   Newt Lukes MD on 10/24/2010   Method used:   Electronically to        [facility]* (retail)       [address]       [place], [place]  [id number]       Ph: [phone]       Fax: [phone]   RxID:   [id number]

## 2010-10-29 ENCOUNTER — Encounter: Payer: Self-pay | Admitting: Internal Medicine

## 2010-10-31 ENCOUNTER — Encounter: Payer: Self-pay | Admitting: Internal Medicine

## 2010-11-18 NOTE — Letter (Signed)
Summary: Nat Math DO  T Ryan Draper DO   Imported By: Lester Potlatch 11/12/2010 09:21:39  _____________________________________________________________________  External Attachment:    Type:   Image     Comment:   External Document

## 2010-11-18 NOTE — Letter (Signed)
Summary: Nat Math DO  T Ryan Draper DO   Imported By: Lester Hephzibah 11/12/2010 09:23:01  _____________________________________________________________________  External Attachment:    Type:   Image     Comment:   External Document

## 2010-11-18 NOTE — Letter (Signed)
Summary: Nat Math DO  T Ryan Draper DO   Imported By: Lester Platte 11/12/2010 09:24:11  _____________________________________________________________________  External Attachment:    Type:   Image     Comment:   External Document

## 2011-02-17 ENCOUNTER — Encounter: Payer: Self-pay | Admitting: Internal Medicine

## 2011-03-02 ENCOUNTER — Telehealth: Payer: Self-pay

## 2011-03-02 NOTE — Telephone Encounter (Signed)
Pt called stating she is going to have Invitro in September but her specialist wants her to have a cardiac stress test first. Pt is requesting referral.

## 2011-03-02 NOTE — Telephone Encounter (Signed)
Pt advised and will expect a call from [facility] with appt info 

## 2011-03-02 NOTE — Telephone Encounter (Signed)
Stress test ordered - [facility] will call once arranged

## 2011-03-10 ENCOUNTER — Telehealth: Payer: Self-pay

## 2011-03-10 DIAGNOSIS — O09529 Supervision of elderly multigravida, unspecified trimester: Secondary | ICD-10-CM

## 2011-03-10 NOTE — Telephone Encounter (Signed)
Jasmine December called requesting a new order for stress test for this patient. Order pended for MD to sign, Jasmine December aware MD out of office until Monday July 16th.

## 2011-03-10 NOTE — Telephone Encounter (Signed)
Done per emr 

## 2011-03-12 ENCOUNTER — Other Ambulatory Visit (HOSPITAL_COMMUNITY): Payer: PRIVATE HEALTH INSURANCE

## 2011-03-23 ENCOUNTER — Encounter: Payer: Self-pay | Admitting: Physician Assistant

## 2011-03-26 ENCOUNTER — Ambulatory Visit (INDEPENDENT_AMBULATORY_CARE_PROVIDER_SITE_OTHER): Payer: PRIVATE HEALTH INSURANCE | Admitting: Physician Assistant

## 2011-03-26 DIAGNOSIS — Z01810 Encounter for preprocedural cardiovascular examination: Secondary | ICD-10-CM

## 2011-03-26 NOTE — Progress Notes (Deleted)
Exercise Treadmill Test  Pre-Exercise Testing Evaluation Rhythm: normal sinus  Rate: 91   PR:  .13 QRS:  .07  QT:  .35 QTc: .43     Test  Exercise Tolerance Test Ordering MD: Rene Paci M.D  Interpreting MD:  Tereso Newcomer PA-C  Unique Test No: 1  Treadmill:  1  Indication for ETT: {CHL INDICATION FOR ZOX:09604540}  Contraindication to ETT: No   Stress Modality: exercise - treadmill  Cardiac Imaging Performed: non   Protocol: standard Bruce - maximal  Max BP:  ***/***  Max MPHR (bpm):  173 85% MPR (bpm):  147  MPHR obtained (bpm):  *** % MPHR obtained:  ***  Reached 85% MPHR (min:sec):  *** Total Exercise Time (min-sec):  ***  Workload in METS:  *** Borg Scale: ***  Reason ETT Terminated:  {CHL REASON TERMINATED FOR ETT:21021064}    ST Segment Analysis At Rest: {CHL ST SEGMENT AT REST FOR JWJ:19147829} With Exercise: {CHL ST SEGMENT WITH EXERCISE FOR FAO:13086578}  Other Information Arrhythmia:  {CHL ARRHYTHMIA FOR ION:62952841} Angina during ETT:  {CHL ANGINA DURING LKG:40102725} Quality of ETT:  {CHL QUALITY OF DGU:44034742}  ETT Interpretation:  {CHL INTERPRETATION FOR VZD:63875643}  Comments: ***  Recommendations: ***

## 2011-03-26 NOTE — Progress Notes (Signed)
Exercise Treadmill Test  Pre-Exercise Testing Evaluation Rhythm: normal sinus  Rate: 91   PR:  .13 QRS:  .07  QT:  .35 QTc: .43     Test  Exercise Tolerance Test Ordering MD: Rene Paci M.D  Interpreting MD:  Tereso Newcomer PA-C  Unique Test No: 1  Treadmill:  1  Indication for ETT: Pre Surgical Evaluation  Contraindication to ETT: No   Stress Modality: exercise - treadmill  Cardiac Imaging Performed: non   Protocol: standard Bruce - maximal  Max BP: 177/101  Max MPHR (bpm):  173 85% MPR (bpm):  147  MPHR obtained (bpm):  171 % MPHR obtained:  98  Reached 85% MPHR (min:sec):  2:43 Total Exercise Time (min-sec):  4:38  Workload in METS:  7.8 Borg Scale: 15  Reason ETT Terminated:  patient's desire to stop    ST Segment Analysis At Rest: non-specific ST segment slurring With Exercise: non-specific ST changes  Other Information Arrhythmia:  No Angina during ETT:  absent (0) Quality of ETT:  diagnostic  ETT Interpretation:  normal - no evidence of ischemia by ST analysis  Comments: Fair exercise tolerance. No chest pain. Hypertensive BP response to exercise. Approximately 1 mm of inferolateral upsloping ST depression at maximal effort. No significant changes to suggest ischemia.  Recommendations: Follow up with PCP and fertility specialist as directed.

## 2011-05-07 ENCOUNTER — Ambulatory Visit (INDEPENDENT_AMBULATORY_CARE_PROVIDER_SITE_OTHER): Payer: PRIVATE HEALTH INSURANCE | Admitting: Internal Medicine

## 2011-05-07 ENCOUNTER — Encounter: Payer: Self-pay | Admitting: Internal Medicine

## 2011-05-07 VITALS — BP 110/74 | HR 72 | Temp 98.2°F | Ht 64.0 in | Wt 220.0 lb

## 2011-05-07 DIAGNOSIS — L02419 Cutaneous abscess of limb, unspecified: Secondary | ICD-10-CM

## 2011-05-07 DIAGNOSIS — L03119 Cellulitis of unspecified part of limb: Secondary | ICD-10-CM

## 2011-05-07 MED ORDER — DOXYCYCLINE HYCLATE 100 MG PO TABS: 100.0000 mg | ORAL_TABLET | Freq: Two times a day (BID) | ORAL | Status: AC

## 2011-05-07 NOTE — Progress Notes (Signed)
  Subjective:    Patient ID: Sheila Nelson, female    DOB: 09/20/1960, 50 y.o.   MRN: [id number]  HPI here with 3 days onset left prox medial thigh abscess (first time for her since she was a child) that fortunately drained nearly completely with reduction of pain, swelling, redness last night after she made appt yesterday.  No fever, n/v, chills, other lesions and pain now mild at best.  Works as Engineer, civil (consulting) for [employer], not known though to be colonized in the past, states she does use usual prophlyactic measures as per the health system.  Husband works Holiday representative, has coworkers and himself with boils in the past as well. Past Medical History  Diagnosis Date  . Acute bronchitis 05/20/2010  . ADVANCED MATERNAL AGE 19/05/2010  . ALLERGIC RHINITIS, SEASONAL 10/09/2009  . ASTHMA UNSPECIFIED WITH EXACERBATION 05/20/2010  . HYPERTENSION 10/09/2009  . PLANTAR FASCIITIS, RIGHT 09/30/2010  . REACTIVE AIRWAY DISEASE 10/09/2009  . TINNITUS 10/09/2009   Past Surgical History  Procedure Date  . Appendectomy   . Right thigh injury 1973  . Viral pancreatitis 1993    reports that she has never smoked. She does not have any smokeless tobacco history on file. She reports that she does not drink alcohol or use illicit drugs. family history includes Heart disease in her other; Hypertension in her other; and Prostate cancer in her father and other. No Known Allergies Current Outpatient Prescriptions on File Prior to Visit  Medication Sig Dispense Refill  . mupirocin (BACTROBAN) 2 % ointment Apply topically 2 (two) times daily as needed.        . diclofenac (FLECTOR) 1.3 % PTCH Place 1 patch onto the skin 2 (two) times daily.        Marland Kitchen levalbuterol (XOPENEX HFA) 45 MCG/ACT inhaler Inhale 1-2 puffs into the lungs every 4 (four) hours as needed.        . loratadine (CLARITIN) 10 MG tablet Take 10 mg by mouth as needed.        . montelukast (SINGULAIR) 10 MG tablet Take 10 mg by mouth at bedtime.        . TraMADol  HCl 50 MG TBDP Take by mouth every 6 (six) hours as needed.         Review of Systems All otherwise neg per pt     Objective:   Physical Exam BP 110/74  Pulse 72  Temp(Src) 98.2 F (36.8 C) (Oral)  Ht 5\' 4"  (1.626 m)  Wt 220 lb (99.791 kg)  BMI 37.76 kg/m2  SpO2 97%  LMP 04/25/2011 Physical Exam  VS noted Constitutional: Pt appears well-developed and well-nourished.  HENT: Head: Normocephalic.  Right Ear: External ear normal.  Left Ear: External ear normal.  Eyes: Conjunctivae and EOM are normal. Pupils are equal, round, and reactive to light.  Neck: Normal range of motion. Neck supple.  Cardiovascular: Normal rate and regular rhythm.   Pulmonary/Chest: Effort normal and breath sounds normal.  Neurological: Pt is alert. No cranial nerve deficit.  Skin: Skin is warm. No erythema. except for left prox medial thigh with 1 cm area small red, swelling, tender with minimal d/c/drainage today, and no signficant surrounding cellulitis or groin adenopathy or other leg swelling Psychiatric: Pt behavior is normal. Thought content normal.        Assessment & Plan:

## 2011-05-07 NOTE — Assessment & Plan Note (Signed)
Mild to mod, for antibx course,  to f/u any worsening symptoms or concerns, spontaneusly drained well yesterday, no need further I&D today

## 2011-05-07 NOTE — Patient Instructions (Signed)
Take all new medications as prescribed Continue all other medications as before  

## 2011-06-10 ENCOUNTER — Other Ambulatory Visit: Payer: Self-pay | Admitting: *Deleted

## 2011-06-10 MED ORDER — LEVALBUTEROL TARTRATE 45 MCG/ACT IN AERO: 1.0000 | INHALATION_SPRAY | RESPIRATORY_TRACT | Status: DC | PRN

## 2011-06-16 ENCOUNTER — Other Ambulatory Visit: Payer: Self-pay | Admitting: *Deleted

## 2011-06-16 MED ORDER — LEVALBUTEROL TARTRATE 45 MCG/ACT IN AERO: 1.0000 | INHALATION_SPRAY | RESPIRATORY_TRACT | Status: DC | PRN

## 2011-06-16 NOTE — Telephone Encounter (Signed)
R'cd fax from [facility] for refill of Xopenex

## 2011-08-04 ENCOUNTER — Encounter (HOSPITAL_COMMUNITY): Payer: Self-pay | Admitting: *Deleted

## 2011-08-04 ENCOUNTER — Emergency Department (HOSPITAL_COMMUNITY)
Admission: EM | Admit: 2011-08-04 | Discharge: 2011-08-04 | Disposition: A | Discharge status: Home / Self Care | Payer: 59 | Source: Home / Self Care | Attending: Emergency Medicine | Admitting: Emergency Medicine

## 2011-08-04 DIAGNOSIS — J069 Acute upper respiratory infection, unspecified: Secondary | ICD-10-CM

## 2011-08-04 DIAGNOSIS — J45909 Unspecified asthma, uncomplicated: Secondary | ICD-10-CM

## 2011-08-04 MED ORDER — METHYLPREDNISOLONE SODIUM SUCC 125 MG IJ SOLR
INTRAMUSCULAR | Status: AC
  Filled 2011-08-04: qty 2

## 2011-08-04 MED ORDER — IPRATROPIUM BROMIDE 0.02 % IN SOLN
0.5000 mg | Freq: Once | RESPIRATORY_TRACT | Status: AC
  Administered 2011-08-04: 0.5 mg via RESPIRATORY_TRACT

## 2011-08-04 MED ORDER — METHYLPREDNISOLONE SODIUM SUCC 125 MG IJ SOLR
125.0000 mg | Freq: Once | INTRAMUSCULAR | Status: AC
  Administered 2011-08-04: 125 mg via INTRAMUSCULAR

## 2011-08-04 MED ORDER — PREDNISONE 10 MG PO TABS: ORAL_TABLET | ORAL | Status: AC

## 2011-08-04 MED ORDER — ALBUTEROL SULFATE (5 MG/ML) 0.5% IN NEBU
5.0000 mg | INHALATION_SOLUTION | Freq: Once | RESPIRATORY_TRACT | Status: AC
  Administered 2011-08-04: 5 mg via RESPIRATORY_TRACT

## 2011-08-04 MED ORDER — BECLOMETHASONE DIPROPIONATE 80 MCG/ACT IN AERS: 2.0000 | INHALATION_SPRAY | Freq: Two times a day (BID) | RESPIRATORY_TRACT | Status: DC

## 2011-08-04 MED ORDER — ALBUTEROL SULFATE (5 MG/ML) 0.5% IN NEBU
INHALATION_SOLUTION | RESPIRATORY_TRACT | Status: AC
  Filled 2011-08-04: qty 1

## 2011-08-04 NOTE — ED Provider Notes (Signed)
History     CSN: [id number] Arrival date & time: 08/04/2011  5:06 PM   First MD Initiated Contact with Patient 08/04/11 1724      Chief Complaint  Patient presents with  . Cough    (Consider location/radiation/quality/duration/timing/severity/associated sxs/prior treatment) HPI Comments: She has a prior history of asthma that tends to flare up whenever she has a viral upper respiratory infection. Over the last 3 days she's had nasal congestion, rhinorrhea, scratchy throat, and some cough. Today she was outside and was exposed to some dust and thereafter her cough got a lot worse and she had wheezing, chest tightness, and shortness of breath. She denies any fever or chills. She tried a Xopenex inhaler at home without relief.  Patient is a 50 y.o. female presenting with cough.  Cough Associated symptoms include rhinorrhea, sore throat, shortness of breath and wheezing. Pertinent negatives include no chills, no ear pain and no eye redness.    Past Medical History  Diagnosis Date  . Acute bronchitis 05/20/2010  . ADVANCED MATERNAL AGE 51/05/2010  . ALLERGIC RHINITIS, SEASONAL 10/09/2009  . ASTHMA UNSPECIFIED WITH EXACERBATION 05/20/2010  . HYPERTENSION 10/09/2009  . PLANTAR FASCIITIS, RIGHT 09/30/2010  . REACTIVE AIRWAY DISEASE 10/09/2009  . TINNITUS 10/09/2009    Past Surgical History  Procedure Date  . Appendectomy   . Right thigh injury 1973  . Viral pancreatitis 1993    Family History  Problem Relation Age of Onset  . Prostate cancer Father   . Hypertension Other     Parent & other relative  . Prostate cancer Other     other relative  . Heart disease Other     parent    History  Substance Use Topics  . Smoking status: Never Smoker   . Smokeless tobacco: Not on file   Comment: Lives with spouse s/p wedding 07/2010. Employed as RN-pediatric critical care; now [employer]  . Alcohol Use: No    OB History    Grav Para Term Preterm Abortions TAB SAB Ect Mult Living                  Review of Systems  Constitutional: Negative for fever, chills and fatigue.  HENT: Positive for congestion, sore throat and rhinorrhea. Negative for ear pain, sneezing, neck stiffness, voice change and postnasal drip.   Eyes: Negative for pain, discharge and redness.  Respiratory: Positive for cough, shortness of breath and wheezing. Negative for chest tightness.   Gastrointestinal: Negative for nausea, vomiting, abdominal pain and diarrhea.  Skin: Negative for rash.    Allergies  Review of patient's allergies indicates no known allergies.  Home Medications   Current Outpatient Rx  Name Route Sig Dispense Refill  . HYDROCOD POLST-CHLORPHEN POLST 10-8 MG/5ML PO LQCR Oral Take 5 mLs by mouth.      . MUPIROCIN 2 % EX OINT Topical Apply topically 2 (two) times daily as needed.      . BECLOMETHASONE DIPROPIONATE 80 MCG/ACT IN AERS Inhalation Inhale 2 puffs into the lungs 2 (two) times daily. 1 Inhaler 0  . LEVALBUTEROL TARTRATE 45 MCG/ACT IN AERO Inhalation Inhale 1-2 puffs into the lungs every 4 (four) hours as needed. 1 Inhaler 1  . PREDNISONE 10 MG PO TABS  Take 4 tabs daily for 4 days, 3 tabs daily for 4 days, 2 tabs daily for 4 days, then 1 tab daily for 4 days.  Take all tabs at one time with food and preferably in the morning except for the  first dose. 40 tablet 0    BP 138/73  Pulse 89  Temp(Src) 98.5 F (36.9 C) (Oral)  Resp 18  SpO2 100%  Physical Exam  Nursing note and vitals reviewed. Constitutional: She appears well-developed and well-nourished. No distress.  HENT:  Head: Normocephalic and atraumatic.  Right Ear: External ear normal.  Left Ear: External ear normal.  Nose: Nose normal.  Mouth/Throat: Oropharynx is clear and moist. No oropharyngeal exudate.  Eyes: Conjunctivae and EOM are normal. Pupils are equal, round, and reactive to light. Right eye exhibits no discharge. Left eye exhibits no discharge.  Neck: Normal range of motion. Neck supple.   Cardiovascular: Normal rate, regular rhythm and normal heart sounds.   Pulmonary/Chest: Effort normal. No stridor. No respiratory distress. She has wheezes (she has scattered bilateral inspiratory and expiratory wheezes). She has no rales. She exhibits no tenderness.  Lymphadenopathy:    She has no cervical adenopathy.  Skin: Skin is warm and dry. No rash noted. She is not diaphoretic.    ED Course  Procedures (including critical care time)  The patient was given the following meds in the [facility]:   Medications  chlorpheniramine-HYDROcodone (TUSSIONEX) 10-8 MG/5ML LQCR (not administered)  beclomethasone (QVAR) 80 MCG/ACT inhaler (not administered)  predniSONE (DELTASONE) 10 MG tablet (not administered)  ipratropium (ATROVENT) nebulizer solution 0.5 mg (0.5 mg Nebulization Given 08/04/11 1800)    And  albuterol (PROVENTIL) (5 MG/ML) 0.5% nebulizer solution 5 mg (5 mg Nebulization Given 08/04/11 1800)  methylPREDNISolone sodium succinate (SOLU-MEDROL) 125 MG injection 125 mg (125 mg Intramuscular Given 08/04/11 1800)     And had the following response:  She had symptomatic improvement and after treatment, her lungs were clear and free of any wheezes  Labs Reviewed - No data to display No results found.   1. Asthma   2. Viral upper respiratory tract infection       MDM          Roque Lias, MD 08/04/11 2117

## 2011-08-04 NOTE — ED Notes (Signed)
PT  DEVELOPED  BODY     ACHES    NASAL  DRAINAGE       COUGH        6  DAYS   GOT  BETTER      THAN TODAY      DEVELOPED  CHEST  TIGHTNESS  DIFFICUTY  BREATHING      WITH       TIGHT  BREATHING

## 2011-11-14 ENCOUNTER — Encounter (HOSPITAL_COMMUNITY): Payer: Self-pay | Admitting: Obstetrics and Gynecology

## 2011-11-14 ENCOUNTER — Inpatient Hospital Stay (HOSPITAL_COMMUNITY)
Admission: AD | Admit: 2011-11-14 | Discharge: 2011-11-14 | Disposition: A | Discharge status: Home / Self Care | Payer: 59 | Source: Ambulatory Visit | Attending: Obstetrics and Gynecology | Admitting: Obstetrics and Gynecology

## 2011-11-14 DIAGNOSIS — R1013 Epigastric pain: Secondary | ICD-10-CM | POA: Insufficient documentation

## 2011-11-14 DIAGNOSIS — O10019 Pre-existing essential hypertension complicating pregnancy, unspecified trimester: Secondary | ICD-10-CM | POA: Insufficient documentation

## 2011-11-14 DIAGNOSIS — I10 Essential (primary) hypertension: Secondary | ICD-10-CM

## 2011-11-14 LAB — URINALYSIS, ROUTINE W REFLEX MICROSCOPIC
Glucose, UA: NEGATIVE mg/dL
Leukocytes, UA: NEGATIVE
Nitrite: NEGATIVE
Protein, ur: NEGATIVE mg/dL
Urobilinogen, UA: 0.2 mg/dL (ref 0.0–1.0)

## 2011-11-14 LAB — POCT PREGNANCY, URINE: Preg Test, Ur: POSITIVE — AB

## 2011-11-14 NOTE — MAU Note (Signed)
Had embryo transfer 2/14 has been on estrogen and progesterone, at [facility] her blood pressure was elevated was prescribed Labetalol woke up this morning BP 160/110 feels shakey, epigastric pressure, took BP med 2 hours ago BP lower however feels sick.

## 2011-11-14 NOTE — MAU Provider Note (Signed)
History    Sheila Nelson is a 51 y.o. female G2P0010 at [redacted]w[redacted]d s/p embryo with donor egg 10/14/2011 [facility]). Here with c/o increased BP at 12pm - 160/110. Also reports epigastric pressure and feel unwell. No VB/cramping/abdominal pain. Denies N/V. 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   Hx cHTN was started on Labetalol 100 mg BID by PCP (Dr. Clelia Croft) Reports last sono on 3/11 with IUP and (+)FHT's, has a scheduled sono 3/25 with Dr. April Manson  CSN: [id number]  Arrival date & time 11/14/11  1500      Chief Complaint  Patient presents with  . Headache  . Abdominal Pain  . Hypertension   HPI  Past Medical History  Diagnosis Date  . Acute bronchitis 05/20/2010  . ADVANCED MATERNAL AGE 01/06/2010  . ALLERGIC RHINITIS, SEASONAL 10/09/2009  . ASTHMA UNSPECIFIED WITH EXACERBATION 05/20/2010  . HYPERTENSION 10/09/2009  . PLANTAR FASCIITIS, RIGHT 09/30/2010  . REACTIVE AIRWAY DISEASE 10/09/2009  . TINNITUS 10/09/2009    Past Surgical History  Procedure Date  . Appendectomy   . Right thigh injury 1973  . Viral pancreatitis 1993  . Hernia repair     Family History  Problem Relation Age of Onset  . Prostate cancer Father   . Hypertension Other     Parent & other relative  . Prostate cancer Other     other relative  . Heart disease Other     parent    History  Substance Use Topics  . Smoking status: Never Smoker   . Smokeless tobacco: Not on file   Comment: Lives with spouse s/p wedding 07/2010. Employed as RN-pediatric critical care; now [employer]  . Alcohol Use: No    OB History    Grav Para Term Preterm Abortions TAB SAB Ect Mult Living   2 0 0 0 1 1 0 0 0 0       Review of Systems  Constitutional: Negative.   Eyes: Negative for visual disturbance.  Genitourinary: Negative for frequency, vaginal bleeding and pelvic pain.  Neurological: Headaches: mild HA earlier, now improved.     Allergies  Review of patient's allergies indicates no known allergies.  Home Medications  No current outpatient prescriptions on file.  Filed Vitals:   11/14/11 1520 11/14/11 1530 11/14/11 1545 11/14/11 1600  BP: 122/73 112/74 114/66 128/80  Pulse: 75 77 70 68  Temp:      TempSrc:      Resp: 20  18 20   Height:      Weight:       Temp Readings from Last 1 Encounters:  11/14/11 98.2 F (36.8 C) Oral     Physical Exam  Constitutional: She is oriented to person, place, and time. She appears well-developed and well-nourished. No distress.  HENT:  Head: Normocephalic.  Eyes: Pupils are equal, round, and reactive to light.  Neck: Normal range of motion.  Cardiovascular: Normal rate and regular rhythm.   Pulmonary/Chest: Effort normal and breath sounds normal.  Abdominal: Soft. Bowel sounds are normal.  Genitourinary:       deferred  Musculoskeletal: She exhibits no edema.  Neurological: She is oriented to person, place, and time.  Skin: Skin is warm and dry.  Psychiatric: She has a normal mood and affect.    ED Course  Procedures   Labs Reviewed  URINALYSIS, ROUTINE W REFLEX MICROSCOPIC - Abnormal; Notable for the following:    Specific  Gravity, Urine <1.005 (*)    All other components within normal limits  POCT PREGNANCY, URINE - Abnormal; Notable for the following:    Preg Test, Ur POSITIVE (*)    All other components within normal limits      MDM   8 wks IUP Chronic hypertension with recent addition of antihypertensive Stable BP since took medication at 1pm (112/74 - 141/87) Patient reassured D/C home, to follow w/ scheduled HED on Tuesday  [facility]  11/14/2011 4:42 PM

## 2011-11-24 LAB — OB RESULTS CONSOLE GC/CHLAMYDIA
Chlamydia: NEGATIVE
Gonorrhea: NEGATIVE

## 2011-11-24 LAB — OB RESULTS CONSOLE GBS: GBS: POSITIVE

## 2011-11-24 LAB — OB RESULTS CONSOLE ANTIBODY SCREEN: Antibody Screen: NEGATIVE

## 2012-01-21 ENCOUNTER — Inpatient Hospital Stay (HOSPITAL_COMMUNITY): Payer: 59

## 2012-01-21 ENCOUNTER — Encounter (HOSPITAL_COMMUNITY): Payer: Self-pay | Admitting: *Deleted

## 2012-01-21 ENCOUNTER — Inpatient Hospital Stay (HOSPITAL_COMMUNITY)
Admission: AD | Admit: 2012-01-21 | Discharge: 2012-01-21 | Disposition: A | Discharge status: Home / Self Care | Payer: 59 | Source: Ambulatory Visit | Attending: Emergency Medicine | Admitting: Emergency Medicine

## 2012-01-21 DIAGNOSIS — R079 Chest pain, unspecified: Secondary | ICD-10-CM | POA: Insufficient documentation

## 2012-01-21 DIAGNOSIS — R000 Tachycardia, unspecified: Secondary | ICD-10-CM | POA: Insufficient documentation

## 2012-01-21 DIAGNOSIS — O99891 Other specified diseases and conditions complicating pregnancy: Secondary | ICD-10-CM | POA: Insufficient documentation

## 2012-01-21 DIAGNOSIS — R002 Palpitations: Secondary | ICD-10-CM | POA: Insufficient documentation

## 2012-01-21 DIAGNOSIS — O9989 Other specified diseases and conditions complicating pregnancy, childbirth and the puerperium: Secondary | ICD-10-CM | POA: Insufficient documentation

## 2012-01-21 DIAGNOSIS — R1013 Epigastric pain: Secondary | ICD-10-CM | POA: Insufficient documentation

## 2012-01-21 DIAGNOSIS — K8020 Calculus of gallbladder without cholecystitis without obstruction: Secondary | ICD-10-CM | POA: Insufficient documentation

## 2012-01-21 LAB — COMPREHENSIVE METABOLIC PANEL
ALT: 16 U/L (ref 0–35)
AST: 15 U/L (ref 0–37)
Calcium: 9.8 mg/dL (ref 8.4–10.5)
Creatinine, Ser: 0.6 mg/dL (ref 0.50–1.10)
Sodium: 135 mEq/L (ref 135–145)
Total Protein: 6 g/dL (ref 6.0–8.3)

## 2012-01-21 LAB — URINALYSIS, ROUTINE W REFLEX MICROSCOPIC
Bilirubin Urine: NEGATIVE
Ketones, ur: NEGATIVE mg/dL
Nitrite: NEGATIVE
Protein, ur: NEGATIVE mg/dL
Specific Gravity, Urine: 1.01 (ref 1.005–1.030)
Urobilinogen, UA: 0.2 mg/dL (ref 0.0–1.0)

## 2012-01-21 LAB — CARDIAC PANEL(CRET KIN+CKTOT+MB+TROPI)
CK, MB: 2.5 ng/mL (ref 0.3–4.0)
Relative Index: INVALID (ref 0.0–2.5)
Troponin I: <0.3 ng/mL (ref <0.30)

## 2012-01-21 LAB — LIPASE, BLOOD: Lipase: 54 U/L (ref 11–59)

## 2012-01-21 LAB — CBC
MCH: 29.5 pg (ref 26.0–34.0)
MCHC: 33.6 g/dL (ref 30.0–36.0)
MCV: 87.6 fL (ref 78.0–100.0)
Platelets: 272 10*3/uL (ref 150–400)
RDW: 12.6 % (ref 11.5–15.5)

## 2012-01-21 LAB — T4, FREE: Free T4: 0.87 ng/dL (ref 0.80–1.80)

## 2012-01-21 MED ORDER — PANTOPRAZOLE SODIUM 40 MG PO TBEC
40.0000 mg | DELAYED_RELEASE_TABLET | Freq: Every day | ORAL | Status: AC
  Administered 2012-01-21: 40 mg via ORAL
  Filled 2012-01-21: qty 1

## 2012-01-21 MED ORDER — LACTATED RINGERS IV SOLN: INTRAVENOUS | Status: DC

## 2012-01-21 MED ORDER — ONDANSETRON HCL 4 MG/2ML IJ SOLN
4.0000 mg | Freq: Once | INTRAMUSCULAR | Status: AC
  Administered 2012-01-21: 4 mg via INTRAVENOUS
  Filled 2012-01-21: qty 2

## 2012-01-21 MED ORDER — ASPIRIN EC 81 MG PO TBEC
81.0000 mg | DELAYED_RELEASE_TABLET | Freq: Once | ORAL | Status: AC
  Administered 2012-01-21: 81 mg via ORAL
  Filled 2012-01-21: qty 1

## 2012-01-21 MED ORDER — MORPHINE SULFATE 4 MG/ML IJ SOLN
4.0000 mg | Freq: Once | INTRAMUSCULAR | Status: AC
  Administered 2012-01-21: 4 mg via INTRAVENOUS
  Filled 2012-01-21: qty 1

## 2012-01-21 MED ORDER — GI COCKTAIL ~~LOC~~
30.0000 mL | Freq: Once | ORAL | Status: AC
  Administered 2012-01-21: 30 mL via ORAL
  Filled 2012-01-21: qty 30

## 2012-01-21 MED ORDER — SODIUM CHLORIDE 0.9 % IV SOLN
INTRAVENOUS | Status: DC
  Administered 2012-01-21: 09:00:00 via INTRAVENOUS

## 2012-01-21 MED ORDER — ONDANSETRON 8 MG PO TBDP
8.0000 mg | ORAL_TABLET | Freq: Once | ORAL | Status: AC
  Administered 2012-01-21: 8 mg via ORAL
  Filled 2012-01-21: qty 1

## 2012-01-21 NOTE — ED Provider Notes (Signed)
11:39 AM Patient to move to CDU for holding.  Pt is [redacted] wks pregnant, presenting with epigastric/lower chest pain, sent by [facility].  Pt with gallstones on bedside ultrasound and positive sonographic murphy's sign.  Pt to have formal US to assess gallbladder, also to have repeat troponin 4 hours after initial one.    12:08 PM Patient in ultrasound.   12:38 PM Ultrasound show mobile gallstones within gallbladder, no e/o cholecystitis.  Pt requesting food.  Will do PO trial.  Troponin pending for 2pm.      1:04 PM Patient reports pain and symptoms have completely resolved.  Reports this morning she woke up with her typical case of indigestion/heart burn that progressed to chest pressure and radiation into her neck.  Pregnancy has been uneventful thus far with exception of hypertension for which she is on labetalol.   Pt's OB is Dr Billy Coast, who is aware of her current symptoms and knows she is in the ED.  Discussed all results with patient.  No needs at this time.  Plan is for troponin at 2pm and d/c home with OB follow up if negative.    Repeat troponin is negative.  Pt continues to be asymptomatic.  Pt d/c home with OB follow up.    Dillard Cannon [place], [place] 01/21/12 [id number]

## 2012-01-21 NOTE — ED Notes (Addendum)
Patient states she woke this morning around 0430 with heart burn and nausea and family took patient to [facility]. Women's administered 81 mg aspirin and 30 mg GI cocktail.  Patient is 16 weeks + 6 days pregnant. Patient states once at [facility] she started to have chest pressure and pain in right side of throat and neck with radiation to right jaw. Patient denies V/D/F.  Patient placed on monitor and sats of 99% on RA.

## 2012-01-21 NOTE — MAU Note (Signed)
Pt G2 P0 at 17.5wks.  Pt woke up with indigestion took tums.  Then felt heart palpitations and increased heart rate of 140 lasting , had a vagal episode felt nauseaus.

## 2012-01-21 NOTE — MAU Note (Signed)
L. Leftwich-Kirby CNM in to see pt. 

## 2012-01-21 NOTE — MAU Note (Signed)
o2 on at 4L for support.

## 2012-01-21 NOTE — MAU Note (Signed)
Dr. Fogleman notified of pt, orders rec'd. 

## 2012-01-21 NOTE — H&P (Signed)
CC: epigastric pressure  HPI: 51 yo G2P0010 (remote TOP) at 16 wks with IVF pregnancy who awoke at 4 am with epigastric pressure. Pt notes episode of 'palpitations' (P of 140) that lasted for a few minutes and associated with dizziness. Pt notes nausea but rare emesis. "I think I would feel better if I could just get 'this' up". Pt now notes pain running up R side of neck (this is a new complaint at 8:45am) No chest pain but a continuous, heavy, pulling epigastric pressure. Palpitations come and go and pre-dated her pregnancy. Pt denies anxiety.    Pt has had reflux in this pregnancy but states "never anything like this". Pt on Zantac 150mg  daily with as needed TUMS. Pt states spicy foods never bother her. She did eat [place] food (spicy/ cabbage) last night. Pt notes no hematemesis. Pt normally w/ daily BM but no BM yesterday. No blood in stool. Not using daily antiemetic. No h/o ulcers. No h/o hiatal hernia.   Pt states normal cardiac stress testing w/in the past year (during her IVF work-up). Pt does have mild hypertension and has been stable of labetalol.  No abdominal cramping, no vaginal bleeding.  PMH: htn PSH: laprascopic appendectomy All: NKDA FH: dad w/ stroke in 62's, mom w/ MVP and valve replacement. 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 Slightly anxious.  CV: RRR Pulm: CTAB Back: no CVAT  Abd: soft, NT, ND GU: deferred as no complaints LE: NT, no edema, no swelling, no cords  EKG:  normal sinus rhythm  MAU course: Pt w/ stable vitals and have remained stable. Pt w/ very minimal relief of heartburn with GI cocktail. Pt has also received 81 mg ASA, 40 mg Protonix, 8mg  Zofran.   A/P: 51 yo at 16 wks of pregnancy (IVF, donor egg) with h/o hypertension who c/o persistent epigastric pain/ pressure without SOB and without relief from typical anti-reflux measures. Despite normal stress testing 1 yr ago, given pt's age, h/o htn and pregnancy stress, need to r/o cardiac etiology. Pt w/o shortness of breath, hemoptysis, and vitals stable but if cardiac work-up is otherwise negative and pt continues to be symptomatic, may need to consider evaluation for PE. V/Q scan and D-dimers usually not helpful in pregnancy and may need CT angio.  No evidence of DVT.  - early gestation, FH present. Prioritize health of mother.  Lanyiah Brix A. 01/21/2012 9:08 AM

## 2012-01-21 NOTE — MAU Note (Signed)
Pt C/O pain that is radiating up into R side of neck from her chest, Dr. Ernestina Penna informed.  Pt rates this pain a 3-4, states it is a lot of pressure.

## 2012-01-21 NOTE — ED Provider Notes (Signed)
History     CSN: [id number]  Arrival date & time 01/21/12  0520   First MD Initiated Contact with Patient 01/21/12 [phone]      Chief Complaint  Patient presents with  . Palpitations  . Tachycardia    (Consider location/radiation/quality/duration/timing/severity/associated sxs/prior treatment) HPI Comments: Pt is [redacted] weeks pregnant after IVF.  Stress test less than a year ago before IVF.  Strong "indigestion like" pain and nausea since this morning.  Seen at [facility]. Given GI cocktail and zofran.  Pain then improved but had residual pressure like pain in epigastrium.  No leg swelling, dyspnea or other new symptoms.  Had intermittent palpitations today but pt says she has palpitations from time to time and that is not uncommon for her.  Patient is a 51 y.o. female presenting with chest pain. The history is provided by the patient.  Chest Pain The chest pain began 3 - 5 hours ago. Duration of episode(s) is 4 hours. Chest pain occurs constantly. The chest pain is improving. Associated with: not worse with breathing or exertion. The severity of the pain is moderate. The quality of the pain is described as pressure-like and burning. The pain does not radiate. Exacerbated by: nothing worsens. Pertinent negatives for primary symptoms include no shortness of breath and no abdominal pain.     Past Medical History  Diagnosis Date  . Acute bronchitis 05/20/2010  . ADVANCED MATERNAL AGE 49/05/2010  . ALLERGIC RHINITIS, SEASONAL 10/09/2009  . ASTHMA UNSPECIFIED WITH EXACERBATION 05/20/2010  . HYPERTENSION 10/09/2009  . PLANTAR FASCIITIS, RIGHT 09/30/2010  . REACTIVE AIRWAY DISEASE 10/09/2009  . TINNITUS 10/09/2009    Past Surgical History  Procedure Date  . Appendectomy   . Right thigh injury 1973  . Viral pancreatitis 1993  . Hernia repair     Family History  Problem Relation Age of Onset  . Prostate cancer Father   . Hypertension Other     Parent & other relative  . Prostate cancer Other    other relative  . Heart disease Other     parent    History  Substance Use Topics  . Smoking status: Never Smoker   . Smokeless tobacco: Not on file   Comment: Lives with spouse s/p wedding 07/2010. Employed as RN-pediatric critical care; now [employer]  . Alcohol Use: No    OB History    Grav Para Term Preterm Abortions TAB SAB Ect Mult Living   2 0 0 0 1 1 0 0 0 0       Review of Systems  Constitutional: Negative for activity change.  HENT: Negative for congestion.   Eyes: Negative for visual disturbance.  Respiratory: Negative for chest tightness and shortness of breath.   Cardiovascular: Positive for chest pain. Negative for leg swelling.  Gastrointestinal: Negative for abdominal pain.  Genitourinary: Negative for dysuria.  Skin: Negative for rash.  Neurological: Negative for syncope.  Psychiatric/Behavioral: Negative for behavioral problems.    Allergies  Review of patient's allergies indicates no known allergies.  Home Medications   Current Outpatient Rx  Name Route Sig Dispense Refill  . RISAQUAD PO CAPS Oral Take 1 capsule by mouth daily.    Marland Kitchen CALCIUM CARBONATE ANTACID 500 MG PO CHEW Oral Chew 1 tablet by mouth daily as needed. As needed for acid reflux    . DHA OMEGA 3 PO Oral Take 2 capsules by mouth daily.    Marland Kitchen FOLIC ACID 400 MCG PO TABS Oral Take 400 mcg by mouth daily.    Marland Kitchen  LABETALOL HCL 100 MG PO TABS Oral Take 100 mg by mouth 2 (two) times daily.    Marland Kitchen LEVALBUTEROL TARTRATE 45 MCG/ACT IN AERO Inhalation Inhale 1-2 puffs into the lungs every 4 (four) hours as needed. 1 Inhaler 1  . LORATADINE 10 MG PO TABS Oral Take 10 mg by mouth daily.    Marland Kitchen PRENATAL MULTIVITAMIN CH Oral Take 1 tablet by mouth daily.    Marland Kitchen RANITIDINE HCL 150 MG PO TABS Oral Take 150 mg by mouth daily as needed. For acid reflux      BP 121/70  Pulse 99  Temp(Src) 97.7 F (36.5 C) (Oral)  Resp 20  Ht 5\' 4"  (1.626 m)  Wt 216 lb (97.977 kg)  BMI 37.08 kg/m2  SpO2 99%  LMP  09/19/2011  Physical Exam  Constitutional: She is oriented to person, place, and time. She appears well-developed and well-nourished.  HENT:  Head: Normocephalic and atraumatic.  Eyes: Conjunctivae and EOM are normal. Pupils are equal, round, and reactive to light. No scleral icterus.  Neck: Normal range of motion. Neck supple.  Cardiovascular: Normal rate and regular rhythm.  Exam reveals no gallop and no friction rub.   No murmur heard. Pulmonary/Chest: Effort normal and breath sounds normal. No respiratory distress. She has no wheezes. She has no rales. She exhibits no tenderness.  Abdominal: Soft. She exhibits no distension and no mass. There is tenderness (epigastrium - moderate). There is no rebound and no guarding.       Negative murphys  Musculoskeletal: Normal range of motion.  Neurological: She is alert and oriented to person, place, and time. She has normal reflexes. No cranial nerve deficit.  Skin: Skin is warm and dry. No rash noted.  Psychiatric: She has a normal mood and affect. Her behavior is normal. Judgment and thought content normal.    ED Course  Procedures (including critical care time)   Date: 01/21/2012  Rate: 65  Rhythm: NSR  QRS Axis: normal  Intervals: normal  ST/T Wave abnormalities: normal  Conduction Disutrbances:none  Narrative Interpretation:   Old EKG Reviewed: unchanged    Labs Reviewed  COMPREHENSIVE METABOLIC PANEL - Abnormal; Notable for the following:    Albumin 3.1 (*)    All other components within normal limits  URINALYSIS, ROUTINE W REFLEX MICROSCOPIC  CBC  CARDIAC PANEL(CRET KIN+CKTOT+MB+TROPI)   No results found.   No diagnosis found.    MDM  Pt is [redacted] weeks pregnant after IVF.  Stress test less than a year ago before IVF.  Strong "indigestion like" pain and nausea since this morning.  Seen at [facility]. Given GI cocktail and zofran.  Pain then improved but had residual pressure like pain in epigastrium.  No leg swelling,  dyspnea or other new symptoms.  Had intermittent palpitations today but pt says she has palpitations from time to time and that is not uncommon for her.  VSS and well appearing.  EKG unconcerning.  Will hold on CXR given pregnant state.  Low suspicion for PE given very atypical presentation.  Hx most c/w GI etiology of pain.  Givne negative stress in last year and atypical nature have low suspicion for ACS.  Will check delta troponin.  Have concern for biliary etiology given abdominal tenderness.  Labs unconcerning.  Will [place].  Care transferred to the CDU.  Pain, nausea treated.        Army Chaco, MD 01/21/12 1201  I saw and evaluated the patient, reviewed the resident's note and I agree  with the findings and plan. Paddy Walthall, Catha Nottingham, MD 01/26/12 [phone]

## 2012-01-21 NOTE — ED Notes (Addendum)
Per EMS- patient started with heart burn this morning approx 0430 and changed to chest pressure. BP 138/84 Hr 70 NS Resp 16 Oxygen 97% RA and CBG 128. Patient is 16 weeks +6 days pregnant.

## 2012-01-21 NOTE — MAU Note (Signed)
Dr. Ernestina Penna notified of pt chest pain.  Orders rec'd.

## 2012-01-21 NOTE — Discharge Instructions (Signed)
Read the information below.  Please call Dr Billy Coast today to schedule a close follow up appointment and discuss medications for your acid reflux.  You may return to the ER at any time for worsening condition or any new symptoms that concern you.   Cholelithiasis Cholelithiasis (also called gallstones) is a form of gallbladder disease where gallstones form in your gallbladder. The gallbladder is a non-essential organ that stores bile made in the liver, which helps digest fats. Gallstones begin as small crystals and slowly grow into stones. Gallstone pain occurs when the gallbladder spasms, and a gallstone is blocking the duct. Pain can also occur when a stone passes out of the duct.  Women are more likely to develop gallstones than men. Other factors that increase the risk of gallbladder disease are:  Having multiple pregnancies. Physicians sometimes advise removing diseased gallbladders before future pregnancies.   Obesity.   Diets heavy in fried foods and fat.   Increasing age (older than 46).   Prolonged use of medications containing female hormones.   Diabetes mellitus.   Rapid weight loss.   Family history of gallstones (heredity).  SYMPTOMS  Feeling sick to your stomach (nauseous).   Abdominal pain.   Yellowing of the skin (jaundice).   Sudden pain. It may persist from several minutes to several hours.   Worsening pain with deep breathing or when jarred.   Fever.   Tenderness to the touch.  In some cases, when gallstones do not move into the bile duct, people have no pain or symptoms. These are called "silent" gallstones. TREATMENT In severe cases, emergency surgery may be required. HOME CARE INSTRUCTIONS   Only take over-the-counter or prescription medicines for pain, discomfort, or fever as directed by your caregiver.   Follow a low-fat diet until seen again. Fat causes the gallbladder to contract, which can result in pain.   Follow up as instructed. Attacks are  almost always recurrent and surgery is usually required for permanent treatment.  SEEK IMMEDIATE MEDICAL CARE IF:   Your pain increases and is not controlled by medications.   You have an oral temperature above 102 F (38.9 C), not controlled by medication.   You develop nausea and vomiting.  MAKE SURE YOU:   Understand these instructions.   Will watch your condition.   Will get help right away if you are not doing well or get worse.  Document Released: 08/13/2005 Document Revised: 08/06/2011 Document Reviewed: 10/16/2010 [facility] Patient Information 2012 [place], [place].  Chest Pain (Nonspecific) It is often hard to give a specific diagnosis for the cause of chest pain. There is always a chance that your pain could be related to something serious, such as a heart attack or a blood clot in the lungs. You need to follow up with your caregiver for further evaluation. CAUSES   Heartburn.   Pneumonia or bronchitis.   Anxiety or stress.   Inflammation around your heart (pericarditis) or lung (pleuritis or pleurisy).   A blood clot in the lung.   A collapsed lung (pneumothorax). It can develop suddenly on its own (spontaneous pneumothorax) or from injury (trauma) to the chest.   Shingles infection (herpes zoster virus).  The chest wall is composed of bones, muscles, and cartilage. Any of these can be the source of the pain.  The bones can be bruised by injury.   The muscles or cartilage can be strained by coughing or overwork.   The cartilage can be affected by inflammation and become sore (costochondritis).  DIAGNOSIS  Lab tests or other studies, such as X-rays, electrocardiography, stress testing, or cardiac imaging, may be needed to find the cause of your pain.  TREATMENT   Treatment depends on what may be causing your chest pain. Treatment may include:   Acid blockers for heartburn.   Anti-inflammatory medicine.   Pain medicine for inflammatory conditions.    Antibiotics if an infection is present.   You may be advised to change lifestyle habits. This includes stopping smoking and avoiding alcohol, caffeine, and chocolate.   You may be advised to keep your head raised (elevated) when sleeping. This reduces the chance of acid going backward from your stomach into your esophagus.   Most of the time, nonspecific chest pain will improve within 2 to 3 days with rest and mild pain medicine.  HOME CARE INSTRUCTIONS   If antibiotics were prescribed, take your antibiotics as directed. Finish them even if you start to feel better.   For the next few days, avoid physical activities that bring on chest pain. Continue physical activities as directed.   Do not smoke.   Avoid drinking alcohol.   Only take over-the-counter or prescription medicine for pain, discomfort, or fever as directed by your caregiver.   Follow your caregiver's suggestions for further testing if your chest pain does not go away.   Keep any follow-up appointments you made. If you do not go to an appointment, you could develop lasting (chronic) problems with pain. If there is any problem keeping an appointment, you must call to reschedule.  SEEK MEDICAL CARE IF:   You think you are having problems from the medicine you are taking. Read your medicine instructions carefully.   Your chest pain does not go away, even after treatment.   You develop a rash with blisters on your chest.  SEEK IMMEDIATE MEDICAL CARE IF:   You have increased chest pain or pain that spreads to your arm, neck, jaw, back, or abdomen.   You develop shortness of breath, an increasing cough, or you are coughing up blood.   You have severe back or abdominal pain, feel nauseous, or vomit.   You develop severe weakness, fainting, or chills.   You have a fever.  THIS IS AN EMERGENCY. Do not wait to see if the pain will go away. Get medical help at once. Call your local emergency services (911 in [place] Do not  drive yourself to the hospital. MAKE SURE YOU:   Understand these instructions.   Will watch your condition.   Will get help right away if you are not doing well or get worse.  Document Released: 05/27/2005 Document Revised: 08/06/2011 Document Reviewed: 03/22/2008 [facility] Patient Information 2012 [place], [place].

## 2012-01-26 NOTE — ED Provider Notes (Signed)
Medical screening examination/treatment/procedure(s) were performed by non-physician practitioner and as supervising physician I was immediately available for consultation/collaboration.   Cyndra Numbers, MD 01/26/12 [phone]

## 2012-03-08 ENCOUNTER — Ambulatory Visit (INDEPENDENT_AMBULATORY_CARE_PROVIDER_SITE_OTHER): Payer: 59 | Admitting: Cardiology

## 2012-03-08 ENCOUNTER — Encounter: Payer: Self-pay | Admitting: Cardiology

## 2012-03-08 VITALS — BP 115/75 | HR 79 | Ht 64.0 in | Wt 220.4 lb

## 2012-03-08 DIAGNOSIS — R002 Palpitations: Secondary | ICD-10-CM

## 2012-03-08 DIAGNOSIS — I10 Essential (primary) hypertension: Secondary | ICD-10-CM

## 2012-03-08 NOTE — Patient Instructions (Addendum)
The current medical regimen is effective;  continue present plan and medications.  Your physician has requested that you have an echocardiogram. Echocardiography is a painless test that uses sound waves to create images of your heart. It provides your doctor with information about the size and shape of your heart and how well your heart's chambers and valves are working. This procedure takes approximately one hour. There are no restrictions for this procedure.  Palpitations  A palpitation is the feeling that your heartbeat is irregular or is faster than normal. Although this is frightening, it usually is not serious. Palpitations may be caused by excesses of smoking, caffeine, or alcohol. They are also brought on by stress and anxiety. Sometimes, they are caused by heart disease. Unless otherwise noted, your caregiver did not find any signs of serious illness at this time. HOME CARE INSTRUCTIONS  To help prevent palpitations:  Drink decaffeinated coffee, tea, and soda pop. Avoid chocolate.   If you smoke or drink alcohol, quit or cut down as much as possible.   Reduce your stress or anxiety level. Biofeedback, yoga, or meditation will help you relax. Physical activity such as swimming, jogging, or walking also may be helpful.  SEEK MEDICAL CARE IF:   You continue to have a fast heartbeat.   Your palpitations occur more often.  SEEK IMMEDIATE MEDICAL CARE IF: You develop chest pain, shortness of breath, severe headache, dizziness, or fainting. Document Released: 08/14/2000 Document Revised: 08/06/2011 Document Reviewed: 10/14/2007 [facility] Patient Information 2012 [place], [place].

## 2012-03-08 NOTE — Assessment & Plan Note (Signed)
Her blood pressure is well controlled. She will continue the medications as listed.

## 2012-03-08 NOTE — Assessment & Plan Note (Signed)
I will check an echocardiogram. Right now he's not particularly problematic to the patient. We could consider increasing labetalol or switching to calcium channel blocker if they become more symptomatic. She will let me know. We will followup with the results of the echo.

## 2012-03-08 NOTE — Progress Notes (Signed)
HPI Patient is [redacted] weeks pregnant with her first pregnancy. This is high risk because of her advanced age.  She has had some hypertension and early on was started with labetalol. Her blood pressure has been well controlled. She's had some palpitations. These have been daily. They are sporadic and at rest. They last for about 10 seconds. She rarely drinks caffeine. She does not have any presyncope or syncope. She has had some chest discomfort and actually was in the emergency room in the for evaluation of this. I reviewed this. Enzymes and a TSH were normal. 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 Climb stairs. She cannot bring on chest pain. This is sporadic and mild. It is not likely reflux she is having. She's having some dyspnea on exertion she thinks is reasonable for her pregnancy and her weight gain.  She has had some mild edema.   No Known Allergies  Current Outpatient Prescriptions  Medication Sig Dispense Refill  . acetaminophen (TYLENOL) 650 MG CR tablet Take 650 mg by mouth every 8 (eight) hours as needed.      Marland Kitchen acidophilus (RISAQUAD) CAPS Take 1 capsule by mouth daily.      . calcium carbonate (TUMS - DOSED IN MG ELEMENTAL CALCIUM) 500 MG chewable tablet Chew 1 tablet by mouth daily as needed. As needed for acid reflux      . Docosahexaenoic Acid (DHA OMEGA 3 PO) Take 2 capsules by mouth daily.      . folic acid (FOLVITE) 400 MCG tablet Take 400 mcg by mouth daily.      Marland Kitchen labetalol (NORMODYNE) 100 MG tablet Take 100 mg by mouth 2 (two) times daily.      Marland Kitchen levalbuterol (XOPENEX HFA) 45 MCG/ACT inhaler Inhale 1-2 puffs into the lungs every 4 (four) hours as needed.  1 Inhaler  1  . MAGNESIUM CITRATE PO Take 400 mg by mouth daily.      . Prenatal Vit-Fe Fumarate-FA (PRENATAL MULTIVITAMIN) TABS Take 1 tablet by mouth daily.      . ranitidine (ZANTAC) 150 MG tablet Take 150 mg by mouth daily as needed. For acid reflux      . DISCONTD: beclomethasone (QVAR) 80 MCG/ACT inhaler  Inhale 2 puffs into the lungs 2 (two) times daily.  1 Inhaler  0    Past Medical History  Diagnosis Date  . Acute bronchitis 05/20/2010  . ADVANCED MATERNAL AGE 13/05/2010  . ALLERGIC RHINITIS, SEASONAL 10/09/2009  . ASTHMA UNSPECIFIED WITH EXACERBATION 05/20/2010  . HYPERTENSION 10/09/2009  . PLANTAR FASCIITIS, RIGHT 09/30/2010  . REACTIVE AIRWAY DISEASE 10/09/2009  . TINNITUS 10/09/2009  . Pancreatitis     Viral    Past Surgical History  Procedure Date  . Appendectomy   . Right thigh surgery 1973    Trauma  . Inguinal hernia repair     Infant, bilateral    Family History  Problem Relation Age of Onset  . Prostate cancer Father   . Hypertension Other     Parent & other relative  . Prostate cancer Other     other relative  . Stroke Father 67  . Valvular heart disease Mother     MVR  . Heart failure Father     History   Social History  . Marital Status: Single    Spouse Name: N/A    Number of Children: 0  . Years of Education: N/A   Occupational History  .  [employer]   Social History  Main Topics  . Smoking status: Never Smoker   . Smokeless tobacco: Not on file   Comment: Lives with spouse s/p wedding 07/2010. Employed as RN-pediatric critical care; now [employer]  . Alcohol Use: No  . Drug Use: No  . Sexually Active: Not Currently   Other Topics Concern  . 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      ROS:  Positive for reflux, varicose veins, asthma. Otherwise as stated in the history of present illness and negative for all systems.    PHYSICAL EXAM BP 115/75  Pulse 79  Ht 5\' 4"  (1.626 m)  Wt 220 lb 6.4 oz (99.973 kg)  BMI 37.83 kg/m2  LMP 09/19/2011 GENERAL:  Well appearing HEENT:  Pupils equal round and reactive, fundi not visualized, oral mucosa unremarkable NECK:  No jugular venous distention, waveform within normal limits, carotid upstroke brisk and symmetric, no bruits, no thyromegaly LYMPHATICS:  No cervical, inguinal  adenopathy LUNGS:  Clear to auscultation bilaterally BACK:  No CVA tenderness CHEST:  Unremarkable HEART:  PMI not displaced or sustained,S1 and S2 within normal limits, no S3, no S4, no clicks, no rubs, no murmurs ABD:  Flat, positive bowel sounds normal in frequency in pitch, no bruits, no rebound, no guarding, no midline pulsatile mass, no hepatomegaly, no splenomegaly, gravid EXT:  2 plus pulses throughout, no edema, no cyanosis no clubbing SKIN:  No rashes no nodules NEURO:  Cranial nerves II through XII grossly intact, motor grossly intact throughout PSYCH:  Cognitively intact, oriented to person place and time  EKG:  Sinus rhythm, rate 73, low voltage in limb leads, no acute ST-T wave changes.01/21/12  ASSESSMENT AND PLAN

## 2012-03-16 ENCOUNTER — Ambulatory Visit (HOSPITAL_COMMUNITY): Payer: 59 | Attending: Cardiology | Admitting: Radiology

## 2012-03-16 DIAGNOSIS — I517 Cardiomegaly: Secondary | ICD-10-CM | POA: Insufficient documentation

## 2012-03-16 DIAGNOSIS — E669 Obesity, unspecified: Secondary | ICD-10-CM | POA: Insufficient documentation

## 2012-03-16 DIAGNOSIS — I2510 Atherosclerotic heart disease of native coronary artery without angina pectoris: Secondary | ICD-10-CM | POA: Insufficient documentation

## 2012-03-16 DIAGNOSIS — R002 Palpitations: Secondary | ICD-10-CM

## 2012-03-16 DIAGNOSIS — O169 Unspecified maternal hypertension, unspecified trimester: Secondary | ICD-10-CM | POA: Insufficient documentation

## 2012-03-16 DIAGNOSIS — O99419 Diseases of the circulatory system complicating pregnancy, unspecified trimester: Secondary | ICD-10-CM | POA: Insufficient documentation

## 2012-03-16 NOTE — Progress Notes (Signed)
Echocardiogram performed.  

## 2012-03-21 ENCOUNTER — Encounter (HOSPITAL_COMMUNITY): Payer: Self-pay | Admitting: *Deleted

## 2012-03-21 ENCOUNTER — Inpatient Hospital Stay (HOSPITAL_COMMUNITY)
Admission: AD | Admit: 2012-03-21 | Discharge: 2012-03-21 | Disposition: A | Discharge status: Home / Self Care | Payer: 59 | Source: Ambulatory Visit | Attending: Obstetrics & Gynecology | Admitting: Obstetrics & Gynecology

## 2012-03-21 DIAGNOSIS — O4700 False labor before 37 completed weeks of gestation, unspecified trimester: Secondary | ICD-10-CM | POA: Insufficient documentation

## 2012-03-21 NOTE — MAU Note (Signed)
Pt reports a few contractions last pm, more today since about 2 pm, lower back pain. Denies bleeding, denies dysuria.

## 2012-03-21 NOTE — ED Provider Notes (Signed)
History  Sheila Nelson 51 y.o. G2P0010 at 25+ wks  Egg donor IVF.    Chief Complaint  Patient presents with  . Contractions   HPP:  Good FMs, no AF leak, no vag. Bleeding.  Occasional UCs today with pelvic pressure.  ROS neg.  OB History    Grav Para Term Preterm Abortions TAB SAB Ect Mult Living   2 0 0 0 1 1 0 0 0 0       Past Medical History  Diagnosis Date  . Acute bronchitis 05/20/2010  . ADVANCED MATERNAL AGE 41/05/2010  . ALLERGIC RHINITIS, SEASONAL 10/09/2009  . ASTHMA UNSPECIFIED WITH EXACERBATION 05/20/2010  . HYPERTENSION 10/09/2009  . PLANTAR FASCIITIS, RIGHT 09/30/2010  . REACTIVE AIRWAY DISEASE 10/09/2009  . TINNITUS 10/09/2009  . Pancreatitis     Viral    Past Surgical History  Procedure Date  . Appendectomy   . Right thigh surgery 1973    Trauma  . Inguinal hernia repair     Infant, bilateral    Family History  Problem Relation Age of Onset  . Prostate cancer Father   . Hypertension Other     Parent & other relative  . Prostate cancer Other     other relative  . Stroke Father 26  . Valvular heart disease Mother     MVR  . Heart failure Father     History  Substance Use Topics  . Smoking status: Never Smoker   . Smokeless tobacco: Not on file   Comment: Lives with spouse s/p wedding 07/2010. Employed as RN-pediatric critical care; now [employer]  . Alcohol Use: No    Allergies: No Known Allergies  Prescriptions prior to admission  Medication Sig Dispense Refill  . acetaminophen (TYLENOL) 650 MG CR tablet Take 650 mg by mouth every 8 (eight) hours as needed.      Marland Kitchen acidophilus (RISAQUAD) CAPS Take 1 capsule by mouth daily.      . calcium carbonate (TUMS - DOSED IN MG ELEMENTAL CALCIUM) 500 MG chewable tablet Chew 1 tablet by mouth daily as needed. As needed for acid reflux      . Docosahexaenoic Acid (DHA OMEGA 3 PO) Take 2 capsules by mouth daily.      . folic acid (FOLVITE) 400 MCG tablet Take 400 mcg by mouth daily.      Marland Kitchen labetalol (NORMODYNE) 100  MG tablet Take 100 mg by mouth 2 (two) times daily.      Marland Kitchen levalbuterol (XOPENEX HFA) 45 MCG/ACT inhaler Inhale 1-2 puffs into the lungs every 4 (four) hours as needed.  1 Inhaler  1  . loratadine (CLARITIN) 10 MG tablet Take 10 mg by mouth daily.      Marland Kitchen MAGNESIUM CITRATE PO Take 400 mg by mouth daily.      . Prenatal Vit-Fe Fumarate-FA (PRENATAL MULTIVITAMIN) TABS Take 1 tablet by mouth daily.      . ranitidine (ZANTAC) 150 MG tablet Take 150 mg by mouth daily as needed. For acid reflux       Physical Exam   Blood pressure 135/86, pulse 90, temperature 98.6 F (37 C), temperature source Oral, resp. rate 20, height 5\' 4"  (1.626 m), weight 100.699 kg (222 lb), last menstrual period 09/19/2011, SpO2 99.00%.  FHR 140's good variability, reassuring for 25 wks. Rare mild irritability VE closed/long/firm/high cervix.  Normal secretions.  No AF.  ED Course  Stable   A/P 25+ wks No evidence of PTL.  F-Well-being reassuring.  Importance of PO hydration discussed.  Increase rest.  OOW tonight's shift.  Genia Del MD  03/21/2012 at 9 pm

## 2012-03-28 ENCOUNTER — Ambulatory Visit: Payer: 59 | Admitting: Internal Medicine

## 2012-04-06 ENCOUNTER — Ambulatory Visit: Payer: 59

## 2012-04-11 ENCOUNTER — Encounter: Payer: 59 | Attending: Obstetrics and Gynecology | Admitting: *Deleted

## 2012-04-11 ENCOUNTER — Encounter: Payer: Self-pay | Admitting: *Deleted

## 2012-04-11 DIAGNOSIS — Z713 Dietary counseling and surveillance: Secondary | ICD-10-CM | POA: Insufficient documentation

## 2012-04-11 DIAGNOSIS — O99810 Abnormal glucose complicating pregnancy: Secondary | ICD-10-CM | POA: Insufficient documentation

## 2012-04-11 NOTE — Patient Instructions (Signed)
Goals:  Check glucose levels per MD as instructed  Follow Gestational Diabetes Diet as instructed  Call for follow-up as needed    

## 2012-04-11 NOTE — Progress Notes (Signed)
  Patient was seen on 04/11/2012 for Gestational Diabetes self-management individual appointment at the [facility]. Met with this patient one on one per her request because she is a nurse in neonatal intensive care, is familiar with blood glucose testing already and states understanding of carbohydrate recommendations per her own research before appointment was made. Due to her work schedule, the class times were not acceptable to her. The following learning objectives were met by the patient during this course:   States the definition of Gestational Diabetes  States why dietary management is important in controlling blood glucose  Describes the effects each nutrient has on blood glucose levels  Demonstrates ability to create a balanced meal plan  Demonstrates carbohydrate counting   States when to check blood glucose levels  Demonstrates proper blood glucose monitoring techniques  States the effect of stress and exercise on blood glucose levels  States the importance of limiting caffeine and abstaining from alcohol and smoking  Blood glucose meter already purchased by patient and brought to this appointment:  One Touch Ultra Mini Self Monitoring Kit Blood glucose readings per patient:  Pre- meal: 80-92 mg/dl Post- meal: 161-096 mg/dl Discussed importance of communicating BGs with MD at every appointment.  Patient instructed to monitor glucose levels: FBS: 60 - <90 2 hour: <120  *Patient received handouts:  Nutrition Diabetes and Pregnancy  Carbohydrate Counting List  Patient will be seen for follow-up as needed.

## 2012-04-18 ENCOUNTER — Encounter: Payer: Self-pay | Admitting: *Deleted

## 2012-06-03 ENCOUNTER — Inpatient Hospital Stay (HOSPITAL_COMMUNITY)
Admission: AD | Admit: 2012-06-03 | Discharge: 2012-06-04 | Disposition: A | Discharge status: Home / Self Care | Payer: 59 | Source: Ambulatory Visit | Attending: Obstetrics and Gynecology | Admitting: Obstetrics and Gynecology

## 2012-06-03 ENCOUNTER — Encounter (HOSPITAL_COMMUNITY): Payer: Self-pay | Admitting: *Deleted

## 2012-06-03 DIAGNOSIS — O99891 Other specified diseases and conditions complicating pregnancy: Secondary | ICD-10-CM | POA: Insufficient documentation

## 2012-06-03 DIAGNOSIS — R1013 Epigastric pain: Secondary | ICD-10-CM | POA: Insufficient documentation

## 2012-06-03 DIAGNOSIS — O10019 Pre-existing essential hypertension complicating pregnancy, unspecified trimester: Secondary | ICD-10-CM | POA: Insufficient documentation

## 2012-06-03 LAB — COMPREHENSIVE METABOLIC PANEL
BUN: 11 mg/dL (ref 6–23)
Calcium: 9.1 mg/dL (ref 8.4–10.5)
GFR calc Af Amer: >90 mL/min (ref >90)
Glucose, Bld: 137 mg/dL — ABNORMAL HIGH (ref 70–99)
Sodium: 133 mEq/L — ABNORMAL LOW (ref 135–145)
Total Protein: 6.5 g/dL (ref 6.0–8.3)

## 2012-06-03 LAB — CBC
HCT: 33.4 % — ABNORMAL LOW (ref 36.0–46.0)
Hemoglobin: 11.4 g/dL — ABNORMAL LOW (ref 12.0–15.0)
MCH: 30.8 pg (ref 26.0–34.0)
MCHC: 34.1 g/dL (ref 30.0–36.0)

## 2012-06-03 LAB — URIC ACID: Uric Acid, Serum: 5.5 mg/dL (ref 2.4–7.0)

## 2012-06-03 MED ORDER — GI COCKTAIL ~~LOC~~
30.0000 mL | Freq: Once | ORAL | Status: AC
  Administered 2012-06-03: 30 mL via ORAL
  Filled 2012-06-03: qty 30

## 2012-06-03 NOTE — MAU Note (Signed)
Dr Cherly Hensen on unit and aware of pt's admission and status. Orders received. Pt aware

## 2012-06-03 NOTE — MAU Note (Signed)
I was laying in bed and had sudden onset of epigastric pain.

## 2012-06-03 NOTE — Progress Notes (Signed)
Up to BR. Feeling much better and more relaxed.

## 2012-06-04 NOTE — Progress Notes (Signed)
Dr Cherly Hensen in and lab results and d/c plan reviewed. Written and verbal d/c instructions given and understanding voiced

## 2012-06-04 NOTE — MAU Note (Signed)
History    Cc severe epigastric pain  Chief Complaint  Patient presents with  . Abdominal Pain  50 yo G2P0010 MWF @ 36 1/[redacted] weeks gestation  With chronic HTN on med presents for evaluation of acute onset of epigastric pain. Pt is on nexium for reflux. Denies h/a(been present until today),. Per pt, swelling is better. No visual changes. Pt has had nl PIH labs. Had pizza prior to sx  OB History    Grav Para Term Preterm Abortions TAB SAB Ect Mult Living   2 0 0 0 1 1 0 0 0 0       Past Medical History  Diagnosis Date  . Acute bronchitis 05/20/2010  . ADVANCED MATERNAL AGE 09/08/2009  . ALLERGIC RHINITIS, SEASONAL 10/09/2009  . ASTHMA UNSPECIFIED WITH EXACERBATION 05/20/2010  . HYPERTENSION 10/09/2009  . PLANTAR FASCIITIS, RIGHT 09/30/2010  . REACTIVE AIRWAY DISEASE 10/09/2009  . TINNITUS 10/09/2009  . Pancreatitis     Viral  . GERD (gastroesophageal reflux disease)   . GDM (gestational diabetes mellitus)     Past Surgical History  Procedure Date  . Appendectomy   . Right thigh surgery 1973    Trauma  . Inguinal hernia repair     Infant, bilateral  . Dilation and curettage of uterus     Family History  Problem Relation Age of Onset  . Prostate cancer Father   . Hypertension Other     Parent & other relative  . Prostate cancer Other     other relative  . Stroke Father 91  . Valvular heart disease Mother     MVR  . Heart failure Father     History  Substance Use Topics  . Smoking status: Never Smoker   . Smokeless tobacco: Never Used   Comment: Lives with spouse s/p wedding 07/2010. Employed as RN-pediatric critical care; now [facility]  . Alcohol Use: Yes     wine with dinner every 2 weeks    Allergies: No Known Allergies  Prescriptions prior to admission  Medication Sig Dispense Refill  . acetaminophen (TYLENOL) 650 MG CR tablet Take 650 mg by mouth every 8 (eight) hours as needed.      . calcium carbonate (TUMS - DOSED IN MG ELEMENTAL CALCIUM) 500 MG chewable tablet Chew  1 tablet by mouth daily as needed. As needed for acid reflux      . diphenhydrAMINE (BENADRYL) 50 MG tablet Take 50 mg by mouth at bedtime as needed.      . Docosahexaenoic Acid (DHA OMEGA 3 PO) Take 2 capsules by mouth daily.      Marland Kitchen esomeprazole (NEXIUM) 20 MG capsule Take 20 mg by mouth daily before breakfast.      . folic acid (FOLVITE) 400 MCG tablet Take 400 mcg by mouth daily.      Marland Kitchen glyBURIDE-metformin (GLUCOVANCE) 1.25-250 MG per tablet Take 1 tablet by mouth daily with breakfast.      . labetalol (NORMODYNE) 100 MG tablet Take 200 mg by mouth 3 (three) times daily.       Marland Kitchen loratadine (CLARITIN) 10 MG tablet Take 10 mg by mouth daily.      Marland Kitchen MAGNESIUM CITRATE PO Take 400 mg by mouth daily.      . montelukast (SINGULAIR) 10 MG tablet Take 10 mg by mouth at bedtime.      . Prenatal Vit-Fe Fumarate-FA (PRENATAL MULTIVITAMIN) TABS Take 1 tablet by mouth daily.      Marland Kitchen acidophilus (RISAQUAD) CAPS Take 1 capsule  by mouth daily.      Marland Kitchen levalbuterol (XOPENEX HFA) 45 MCG/ACT inhaler Inhale 1-2 puffs into the lungs every 4 (four) hours as needed.  1 Inhaler  1  . ranitidine (ZANTAC) 150 MG tablet Take 150 mg by mouth daily as needed. For acid reflux         Physical Exam   Blood pressure 118/70, pulse 78, temperature 97.6 F (36.4 C), temperature source Oral, resp. rate 20, height 5\' 4"  (1.626 m), weight 105.235 kg (232 lb), last menstrual period 09/19/2011, SpO2 99.00%.  General appearance: alert, cooperative and no distress Lungs: clear to auscultation bilaterally Heart: regular rate and rhythm, S1, S2 normal, no murmur, click, rub or gallop Abdomen: gravid nontender Extremities: trace edema  PIH labs nl ED Course  Epigastric pain Chronic HTN w/o evidence of superimposed preeclampsia IUP @ 36 1/7 weeks P) d/c home. PIH warning signs. Keep ob appt 10/7 MDM   Laira Penninger A, MD 12:26 AM 06/04/2012

## 2012-06-04 NOTE — MAU Note (Signed)
Dr Cherly Hensen notified of pt's lab results, B/Ps, and no pain. Will see pt

## 2012-06-14 ENCOUNTER — Encounter (HOSPITAL_COMMUNITY): Payer: Self-pay | Admitting: *Deleted

## 2012-06-14 ENCOUNTER — Telehealth (HOSPITAL_COMMUNITY): Payer: Self-pay | Admitting: *Deleted

## 2012-06-14 NOTE — Telephone Encounter (Signed)
Preadmission screen  

## 2012-06-15 ENCOUNTER — Telehealth (HOSPITAL_COMMUNITY): Payer: Self-pay | Admitting: *Deleted

## 2012-06-15 ENCOUNTER — Encounter (HOSPITAL_COMMUNITY): Payer: Self-pay | Admitting: *Deleted

## 2012-06-15 NOTE — Telephone Encounter (Signed)
Preadmission screen  

## 2012-06-17 ENCOUNTER — Other Ambulatory Visit: Payer: Self-pay | Admitting: Obstetrics and Gynecology

## 2012-06-20 ENCOUNTER — Observation Stay (HOSPITAL_COMMUNITY)
Admission: AD | Admit: 2012-06-20 | Discharge: 2012-06-20 | Disposition: A | Discharge status: Home / Self Care | Payer: 59 | Source: Ambulatory Visit | Attending: Obstetrics and Gynecology | Admitting: Obstetrics and Gynecology

## 2012-06-20 ENCOUNTER — Encounter (HOSPITAL_COMMUNITY): Payer: Self-pay | Admitting: *Deleted

## 2012-06-20 DIAGNOSIS — E119 Type 2 diabetes mellitus without complications: Secondary | ICD-10-CM | POA: Insufficient documentation

## 2012-06-20 DIAGNOSIS — O10019 Pre-existing essential hypertension complicating pregnancy, unspecified trimester: Principal | ICD-10-CM | POA: Insufficient documentation

## 2012-06-20 DIAGNOSIS — O24919 Unspecified diabetes mellitus in pregnancy, unspecified trimester: Secondary | ICD-10-CM | POA: Insufficient documentation

## 2012-06-20 LAB — COMPREHENSIVE METABOLIC PANEL
ALT: 15 U/L (ref 0–35)
ALT: 16 U/L (ref 0–35)
Albumin: 2.5 g/dL — ABNORMAL LOW (ref 3.5–5.2)
Alkaline Phosphatase: 155 U/L — ABNORMAL HIGH (ref 39–117)
CO2: 20 mEq/L (ref 19–32)
Calcium: 9.1 mg/dL (ref 8.4–10.5)
Creatinine, Ser: 0.72 mg/dL (ref 0.50–1.10)
GFR calc Af Amer: >90 mL/min (ref >90)
GFR calc non Af Amer: >90 mL/min (ref >90)
Glucose, Bld: 74 mg/dL (ref 70–99)
Potassium: 3.8 mEq/L (ref 3.5–5.1)
Sodium: 133 mEq/L — ABNORMAL LOW (ref 135–145)
Total Protein: 5.4 g/dL — ABNORMAL LOW (ref 6.0–8.3)

## 2012-06-20 LAB — URINALYSIS, ROUTINE W REFLEX MICROSCOPIC
Nitrite: NEGATIVE
Specific Gravity, Urine: 1.02 (ref 1.005–1.030)
Urobilinogen, UA: 0.2 mg/dL (ref 0.0–1.0)
pH: 6 (ref 5.0–8.0)

## 2012-06-20 LAB — GLUCOSE, CAPILLARY

## 2012-06-20 LAB — CBC
Hemoglobin: 11 g/dL — ABNORMAL LOW (ref 12.0–15.0)
MCH: 31 pg (ref 26.0–34.0)
MCHC: 34.6 g/dL (ref 30.0–36.0)
MCV: 91.3 fL (ref 78.0–100.0)
MCV: 91 fL (ref 78.0–100.0)
Platelets: 201 10*3/uL (ref 150–400)
RBC: 3.55 MIL/uL — ABNORMAL LOW (ref 3.87–5.11)
RDW: 13.1 % (ref 11.5–15.5)
WBC: 7.5 10*3/uL (ref 4.0–10.5)

## 2012-06-20 MED ORDER — GLYBURIDE-METFORMIN 1.25-250 MG PO TABS: 1.0000 | ORAL_TABLET | Freq: Every day | ORAL | Status: DC

## 2012-06-20 MED ORDER — PRENATAL MULTIVITAMIN CH: 1.0000 | ORAL_TABLET | Freq: Every day | ORAL | Status: DC

## 2012-06-20 MED ORDER — ZOLPIDEM TARTRATE 5 MG PO TABS: 5.0000 mg | ORAL_TABLET | Freq: Every evening | ORAL | Status: DC | PRN

## 2012-06-20 MED ORDER — ACETAMINOPHEN 325 MG PO TABS: 650.0000 mg | ORAL_TABLET | ORAL | Status: DC | PRN

## 2012-06-20 MED ORDER — METFORMIN HCL 500 MG PO TABS
250.0000 mg | ORAL_TABLET | Freq: Every day | ORAL | Status: DC
  Filled 2012-06-20: qty 1

## 2012-06-20 MED ORDER — LABETALOL HCL 200 MG PO TABS
200.0000 mg | ORAL_TABLET | Freq: Three times a day (TID) | ORAL | Status: DC
  Administered 2012-06-20: 200 mg via ORAL
  Filled 2012-06-20: qty 1

## 2012-06-20 MED ORDER — DOCUSATE SODIUM 100 MG PO CAPS: 100.0000 mg | ORAL_CAPSULE | Freq: Every day | ORAL | Status: DC

## 2012-06-20 MED ORDER — CALCIUM CARBONATE ANTACID 500 MG PO CHEW: 2.0000 | CHEWABLE_TABLET | ORAL | Status: DC | PRN

## 2012-06-20 MED ORDER — GLYBURIDE 1.25 MG PO TABS
1.2500 mg | ORAL_TABLET | Freq: Every day | ORAL | Status: DC
  Administered 2012-06-20: 1.25 mg via ORAL
  Filled 2012-06-20: qty 1

## 2012-06-20 NOTE — Progress Notes (Signed)
Sheila Nelson is a 51 y.o. G2P0010 at [redacted]w[redacted]d by LMP admitted for headache and increased BP at home.  Subjective: HA improved. Good FM. No visual changes. No epigastric pain.  Objective: BP 134/74  Pulse 66  Temp 97.5 F (36.4 C) (Oral)  Resp 20  Ht 5\' 4"  (1.626 m)  Wt 108.41 kg (239 lb)  BMI 41.02 kg/m2  LMP 09/19/2011 I/O last 3 completed shifts: In: 240 [P.O.:240] Out: 650 [Urine:650] Lgs CTA CV: RRR ABD: gravid and NT. No RUQ tenderness Neuro: nonfocal Ext: DTRs 2+ and no clonus    FHT:  FHR: 125 bpm, variability: moderate,  accelerations:  Present,  decelerations:  Absent UC:   none SVE:    deferred Labs: Lab Results  Component Value Date   WBC 8.3 06/20/2012   HGB 11.0* 06/20/2012   HCT 32.4* 06/20/2012   MCV 91.3 06/20/2012   PLT 198 06/20/2012   CMP wnl  Assessment / Plan: 38+ weeks. CHTN stable. No s/s superimposed PEC Class A 2 DM  Labor: na Preeclampsia:  no signs or symptoms of toxicity, intake and ouput balanced and labs stable Fetal Wellbeing:  Category I Pain Control:  na I/D:  n/a Anticipated MOD:  na Will rpt labs this am at 0900 If stable will dc home. Scheduled for induction at 39 weeks.  Sheila Nelson J 06/20/2012, 7:24 AM

## 2012-06-20 NOTE — MAU Note (Signed)
Pt G2 P0 at 38.3wks with elevated BP, headache and increased swelling.  Last took 200mg  of Labetalol at 2130.

## 2012-06-20 NOTE — MAU Note (Signed)
History    Cc headache  Chief Complaint  Patient presents with  . Hypertension  . Headache  51 yo G2P0010 MWF @ 38 3/[redacted] weeks gestation  With chronic HTN on med presents for evaluation of acute onset of headache. Pt is on nexium for reflux and Labetalol for her BP control. History of Class A2DM well controlled. Good FM. NO epigastric pain. Rare contractions. No bleeding. No visual changes.  OB History    Grav Para Term Preterm Abortions TAB SAB Ect Mult Living   2 0 0 0 1 1 0 0 0 0       Past Medical History  Diagnosis Date  . Acute bronchitis 05/20/2010  . ADVANCED MATERNAL AGE 49/05/2010  . ALLERGIC RHINITIS, SEASONAL 10/09/2009  . ASTHMA UNSPECIFIED WITH EXACERBATION 05/20/2010  . HYPERTENSION 10/09/2009  . PLANTAR FASCIITIS, RIGHT 09/30/2010  . REACTIVE AIRWAY DISEASE 10/09/2009  . TINNITUS 10/09/2009  . Pancreatitis     Viral  . GERD (gastroesophageal reflux disease)   . Hx of viral meningitis   . Dysrhythmia     palpitations  . GDM (gestational diabetes mellitus)     glyburide  . Herpes   . H/O varicella   . Obese   . Gestational diabetes     glyburide    Past Surgical History  Procedure Date  . Appendectomy   . Right thigh surgery 1973    Trauma  . Inguinal hernia repair     Infant, bilateral  . Dilation and curettage of uterus   . Mouth surgery     Family History  Problem Relation Age of Onset  . Prostate cancer Father   . Stroke Father 36  . Heart failure Father   . Hypertension Father   . Cancer Father     prostate  . Valvular heart disease Mother     MVR  . Thrombocytopenia Mother   . Birth defects Sister     tetralogy of falot  . Cancer Brother     prostate  . Stroke Maternal Grandmother     History  Substance Use Topics  . Smoking status: Never Smoker   . Smokeless tobacco: Never Used   Comment: Lives with spouse s/p wedding 07/2010. Employed as RN-pediatric critical care; now [facility]  . Alcohol Use: Yes     wine with dinner every 2 weeks     Allergies: No Known Allergies  Prescriptions prior to admission  Medication Sig Dispense Refill  . acetaminophen (TYLENOL) 650 MG CR tablet Take 650 mg by mouth every 8 (eight) hours as needed.      Marland Kitchen acidophilus (RISAQUAD) CAPS Take 1 capsule by mouth daily.      . calcium carbonate (TUMS - DOSED IN MG ELEMENTAL CALCIUM) 500 MG chewable tablet Chew 1 tablet by mouth daily as needed. As needed for acid reflux      . diphenhydrAMINE (BENADRYL) 50 MG tablet Take 50 mg by mouth at bedtime as needed.      . Docosahexaenoic Acid (DHA OMEGA 3 PO) Take 2 capsules by mouth daily.      Marland Kitchen esomeprazole (NEXIUM) 20 MG capsule Take 20 mg by mouth daily before breakfast.      . folic acid (FOLVITE) 400 MCG tablet Take 400 mcg by mouth daily.      Marland Kitchen glyBURIDE-metformin (GLUCOVANCE) 1.25-250 MG per tablet Take 1 tablet by mouth daily with breakfast.      . labetalol (NORMODYNE) 100 MG tablet Take 200 mg by mouth 3 (three)  times daily.       Marland Kitchen levalbuterol (XOPENEX HFA) 45 MCG/ACT inhaler Inhale 1-2 puffs into the lungs every 4 (four) hours as needed.  1 Inhaler  1  . loratadine (CLARITIN) 10 MG tablet Take 10 mg by mouth daily.      Marland Kitchen MAGNESIUM CITRATE PO Take 400 mg by mouth daily.      . montelukast (SINGULAIR) 10 MG tablet Take 10 mg by mouth at bedtime.      . Prenatal Vit-Fe Fumarate-FA (PRENATAL MULTIVITAMIN) TABS Take 1 tablet by mouth daily.      . ranitidine (ZANTAC) 150 MG tablet Take 150 mg by mouth daily as needed. For acid reflux         Physical Exam   BP 125/78  Pulse 72  Temp 97.3 F (36.3 C) (Oral)  Resp 20  Ht 5\' 4"  (1.626 m)  Wt 108.5 kg (239 lb 3.2 oz)  BMI 41.06 kg/m2  LMP 09/19/2011   General appearance: alert, cooperative and no distress HEENT : wnl Lungs: clear to auscultation bilaterally Heart: regular rate and rhythm, S1, S2 normal, no murmur, click, rub or gallop Abdomen: gravid nontender Extremities: trace edema, DTRs nl Pelvic : deferred. Neuro:  nonfocal Skin: intact  PIH labs pending Reactive NST- Category I tracing ED Course  Headache Chronic HTN will rule out superimposed preeclampsia. BP stable. IUP @ 383/7 weeks Class A2DM  P) Admit for serial labs and possible delivery MDM   Lenoard Aden, MD 2:58 AM 06/20/2012

## 2012-06-20 NOTE — MAU Note (Signed)
Pt states she started with a headache about 1600  06/19/2012. Pt woke tonight and took BP and it was elevated. Pt states she took her blood pressure and it was elevated 160, 170 over 110, 112

## 2012-06-20 NOTE — Progress Notes (Signed)
Post discharge review completed. 

## 2012-06-22 ENCOUNTER — Encounter (HOSPITAL_COMMUNITY): Payer: Self-pay

## 2012-06-22 ENCOUNTER — Inpatient Hospital Stay (HOSPITAL_COMMUNITY)
Admission: RE | Admit: 2012-06-22 | Discharge: 2012-06-25 | DRG: 766 | Disposition: A | Discharge status: Home / Self Care | Payer: 59 | Source: Ambulatory Visit | Attending: Obstetrics & Gynecology | Admitting: Obstetrics & Gynecology

## 2012-06-22 DIAGNOSIS — O99892 Other specified diseases and conditions complicating childbirth: Secondary | ICD-10-CM | POA: Diagnosis present

## 2012-06-22 DIAGNOSIS — I10 Essential (primary) hypertension: Secondary | ICD-10-CM

## 2012-06-22 DIAGNOSIS — O99814 Abnormal glucose complicating childbirth: Secondary | ICD-10-CM | POA: Diagnosis present

## 2012-06-22 DIAGNOSIS — O09529 Supervision of elderly multigravida, unspecified trimester: Secondary | ICD-10-CM | POA: Diagnosis present

## 2012-06-22 DIAGNOSIS — Z22330 Carrier of Group B streptococcus: Secondary | ICD-10-CM

## 2012-06-22 DIAGNOSIS — O1002 Pre-existing essential hypertension complicating childbirth: Principal | ICD-10-CM | POA: Diagnosis present

## 2012-06-22 LAB — COMPREHENSIVE METABOLIC PANEL
ALT: 19 U/L (ref 0–35)
Alkaline Phosphatase: 172 U/L — ABNORMAL HIGH (ref 39–117)
BUN: 14 mg/dL (ref 6–23)
CO2: 19 mEq/L (ref 19–32)
Chloride: 100 mEq/L (ref 96–112)
GFR calc Af Amer: >90 mL/min (ref >90)
GFR calc non Af Amer: >90 mL/min (ref >90)
Glucose, Bld: 134 mg/dL — ABNORMAL HIGH (ref 70–99)
Potassium: 3.8 mEq/L (ref 3.5–5.1)
Sodium: 135 mEq/L (ref 135–145)
Total Bilirubin: 0.3 mg/dL (ref 0.3–1.2)

## 2012-06-22 LAB — CBC
HCT: 33.4 % — ABNORMAL LOW (ref 36.0–46.0)
Hemoglobin: 11.2 g/dL — ABNORMAL LOW (ref 12.0–15.0)
MCV: 91.3 fL (ref 78.0–100.0)
RDW: 13.2 % (ref 11.5–15.5)
WBC: 9.3 10*3/uL (ref 4.0–10.5)

## 2012-06-22 LAB — LACTATE DEHYDROGENASE: LDH: 217 U/L (ref 94–250)

## 2012-06-22 MED ORDER — ZOLPIDEM TARTRATE 5 MG PO TABS: 5.0000 mg | ORAL_TABLET | Freq: Every evening | ORAL | Status: DC | PRN

## 2012-06-22 MED ORDER — ACETAMINOPHEN 325 MG PO TABS
650.0000 mg | ORAL_TABLET | ORAL | Status: DC | PRN
  Administered 2012-06-23: 650 mg via ORAL
  Filled 2012-06-22: qty 2

## 2012-06-22 MED ORDER — OXYTOCIN 40 UNITS IN LACTATED RINGERS INFUSION - SIMPLE MED: 62.5000 mL/h | INTRAVENOUS | Status: DC

## 2012-06-22 MED ORDER — BUTORPHANOL TARTRATE 1 MG/ML IJ SOLN
1.0000 mg | INTRAMUSCULAR | Status: DC | PRN
  Administered 2012-06-23: 1 mg via INTRAVENOUS
  Filled 2012-06-22: qty 1

## 2012-06-22 MED ORDER — LACTATED RINGERS IV SOLN
INTRAVENOUS | Status: DC
  Administered 2012-06-22 – 2012-06-23 (×2): via INTRAVENOUS

## 2012-06-22 MED ORDER — MISOPROSTOL 25 MCG QUARTER TABLET
25.0000 ug | ORAL_TABLET | ORAL | Status: DC | PRN
  Administered 2012-06-22 – 2012-06-23 (×2): 25 ug via VAGINAL
  Filled 2012-06-22 (×2): qty 0.25

## 2012-06-22 MED ORDER — LACTATED RINGERS IV SOLN: 500.0000 mL | INTRAVENOUS | Status: DC | PRN

## 2012-06-22 MED ORDER — PENICILLIN G POTASSIUM 5000000 UNITS IJ SOLR
2.5000 10*6.[IU] | INTRAVENOUS | Status: DC
  Administered 2012-06-23 (×3): 2.5 10*6.[IU] via INTRAVENOUS
  Filled 2012-06-22 (×11): qty 2.5

## 2012-06-22 MED ORDER — OXYTOCIN 40 UNITS IN LACTATED RINGERS INFUSION - SIMPLE MED
1.0000 m[IU]/min | INTRAVENOUS | Status: DC
  Administered 2012-06-23: 11 m[IU]/min via INTRAVENOUS
  Administered 2012-06-23: 13 m[IU]/min via INTRAVENOUS
  Administered 2012-06-23: 2 m[IU]/min via INTRAVENOUS
  Administered 2012-06-23: 4 m[IU]/min via INTRAVENOUS
  Administered 2012-06-23: 8 m[IU]/min via INTRAVENOUS
  Administered 2012-06-23: 14 m[IU]/min via INTRAVENOUS
  Administered 2012-06-23: 12 m[IU]/min via INTRAVENOUS
  Filled 2012-06-22: qty 1000

## 2012-06-22 MED ORDER — PENICILLIN G POTASSIUM 5000000 UNITS IJ SOLR
5.0000 10*6.[IU] | Freq: Once | INTRAVENOUS | Status: DC
  Filled 2012-06-22: qty 5

## 2012-06-22 MED ORDER — LIDOCAINE HCL (PF) 1 % IJ SOLN
30.0000 mL | INTRAMUSCULAR | Status: DC | PRN
  Filled 2012-06-22: qty 30

## 2012-06-22 MED ORDER — FLEET ENEMA 7-19 GM/118ML RE ENEM: 1.0000 | ENEMA | RECTAL | Status: DC | PRN

## 2012-06-22 MED ORDER — TERBUTALINE SULFATE 1 MG/ML IJ SOLN: 0.2500 mg | Freq: Once | INTRAMUSCULAR | Status: AC | PRN

## 2012-06-22 MED ORDER — PENICILLIN G POTASSIUM 5000000 UNITS IJ SOLR: 5.0000 10*6.[IU] | Freq: Once | INTRAMUSCULAR | Status: DC

## 2012-06-22 MED ORDER — PENICILLIN G POTASSIUM 5000000 UNITS IJ SOLR: 2.5000 10*6.[IU] | INTRAVENOUS | Status: DC

## 2012-06-22 MED ORDER — DEXTROSE 5 % IV SOLN
5.0000 10*6.[IU] | Freq: Once | INTRAVENOUS | Status: AC
  Administered 2012-06-23: 5 10*6.[IU] via INTRAVENOUS
  Filled 2012-06-22: qty 5

## 2012-06-22 MED ORDER — CITRIC ACID-SODIUM CITRATE 334-500 MG/5ML PO SOLN
30.0000 mL | ORAL | Status: DC | PRN
  Administered 2012-06-23: 30 mL via ORAL
  Filled 2012-06-22: qty 15

## 2012-06-22 MED ORDER — IBUPROFEN 600 MG PO TABS: 600.0000 mg | ORAL_TABLET | Freq: Four times a day (QID) | ORAL | Status: DC | PRN

## 2012-06-22 MED ORDER — ZOLPIDEM TARTRATE 5 MG PO TABS
5.0000 mg | ORAL_TABLET | Freq: Every evening | ORAL | Status: DC | PRN
  Administered 2012-06-22: 5 mg via ORAL
  Filled 2012-06-22: qty 1

## 2012-06-22 MED ORDER — OXYCODONE-ACETAMINOPHEN 5-325 MG PO TABS: 1.0000 | ORAL_TABLET | ORAL | Status: DC | PRN

## 2012-06-22 MED ORDER — OXYTOCIN BOLUS FROM INFUSION
500.0000 mL | INTRAVENOUS | Status: DC
  Filled 2012-06-22 (×84): qty 500

## 2012-06-22 MED ORDER — LABETALOL HCL 200 MG PO TABS
200.0000 mg | ORAL_TABLET | Freq: Three times a day (TID) | ORAL | Status: DC
  Administered 2012-06-22 – 2012-06-24 (×3): 200 mg via ORAL
  Filled 2012-06-22 (×5): qty 1

## 2012-06-22 MED ORDER — ONDANSETRON HCL 4 MG/2ML IJ SOLN
4.0000 mg | Freq: Four times a day (QID) | INTRAMUSCULAR | Status: DC | PRN
  Administered 2012-06-23: 4 mg via INTRAVENOUS
  Filled 2012-06-22: qty 2

## 2012-06-22 NOTE — Progress Notes (Signed)
Consepcion Nelson is a 51 y.o. G2P0010 at [redacted]w[redacted]d by LMP admitted for induction of labor due to Diabetes and Hypertension.  Subjective: Comfortable No HA, no visual changes  Objective: BP 142/87  Pulse 84  Temp 98.4 F (36.9 C) (Oral)  Resp 20  Ht 5\' 4"  (1.626 m)  Wt 107.502 kg (237 lb)  BMI 40.68 kg/m2  LMP 09/19/2011      FHT:  FHR: 155 bpm, variability: moderate,  accelerations:  Present,  decelerations:  Absent UC:   none SVE:    cl/70/-1 Cytotec placed  Labs: Lab Results  Component Value Date   WBC 7.5 06/20/2012   HGB 11.6* 06/20/2012   HCT 33.5* 06/20/2012   MCV 91.0 06/20/2012   PLT 201 06/20/2012    Assessment / Plan: 38+ weeks CHTN Class A2 DM GBS pos History of HSV  Labor: Progressing normally Preeclampsia:  no signs or symptoms of toxicity, intake and ouput balanced and labs stable Fetal Wellbeing:  Category I Pain Control:  Labor support without medications I/D:  n/a Anticipated MOD:  NSVD  Sheila Nelson J 06/22/2012, 9:09 PM

## 2012-06-23 ENCOUNTER — Encounter (HOSPITAL_COMMUNITY): Payer: Self-pay | Admitting: Anesthesiology

## 2012-06-23 ENCOUNTER — Encounter (HOSPITAL_COMMUNITY): Payer: Self-pay

## 2012-06-23 ENCOUNTER — Encounter (HOSPITAL_COMMUNITY)
Admission: RE | Disposition: A | Discharge status: Home / Self Care | Payer: Self-pay | Source: Ambulatory Visit | Attending: Obstetrics & Gynecology

## 2012-06-23 ENCOUNTER — Inpatient Hospital Stay (HOSPITAL_COMMUNITY): Payer: 59 | Admitting: Anesthesiology

## 2012-06-23 LAB — CBC
HCT: 35.7 % — ABNORMAL LOW (ref 36.0–46.0)
MCH: 30.7 pg (ref 26.0–34.0)
MCHC: 33.9 g/dL (ref 30.0–36.0)
MCV: 90.6 fL (ref 78.0–100.0)
RDW: 13.2 % (ref 11.5–15.5)

## 2012-06-23 LAB — TYPE AND SCREEN: Antibody Screen: POSITIVE

## 2012-06-23 LAB — GLUCOSE, CAPILLARY: Glucose-Capillary: 112 mg/dL — ABNORMAL HIGH (ref 70–99)

## 2012-06-23 SURGERY — Surgical Case
Anesthesia: Regional | Site: Abdomen | Wound class: Clean Contaminated

## 2012-06-23 MED ORDER — LIDOCAINE-EPINEPHRINE (PF) 2 %-1:200000 IJ SOLN
INTRAMUSCULAR | Status: AC
  Filled 2012-06-23: qty 20

## 2012-06-23 MED ORDER — ONDANSETRON HCL 4 MG/2ML IJ SOLN
INTRAMUSCULAR | Status: DC | PRN
  Administered 2012-06-23: 4 mg via INTRAVENOUS

## 2012-06-23 MED ORDER — SCOPOLAMINE 1 MG/3DAYS TD PT72
1.0000 | MEDICATED_PATCH | Freq: Once | TRANSDERMAL | Status: DC
  Administered 2012-06-24: 1.5 mg via TRANSDERMAL

## 2012-06-23 MED ORDER — KETOROLAC TROMETHAMINE 30 MG/ML IJ SOLN: 30.0000 mg | Freq: Four times a day (QID) | INTRAMUSCULAR | Status: AC | PRN

## 2012-06-23 MED ORDER — LACTATED RINGERS IV SOLN
INTRAVENOUS | Status: DC | PRN
  Administered 2012-06-23 (×2): via INTRAVENOUS

## 2012-06-23 MED ORDER — PROMETHAZINE HCL 25 MG/ML IJ SOLN: 6.2500 mg | INTRAMUSCULAR | Status: DC | PRN

## 2012-06-23 MED ORDER — PHENYLEPHRINE 40 MCG/ML (10ML) SYRINGE FOR IV PUSH (FOR BLOOD PRESSURE SUPPORT)
80.0000 ug | PREFILLED_SYRINGE | INTRAVENOUS | Status: DC | PRN
  Filled 2012-06-23: qty 5

## 2012-06-23 MED ORDER — PHENYLEPHRINE HCL 10 MG/ML IJ SOLN
INTRAMUSCULAR | Status: DC | PRN
  Administered 2012-06-23: 80 ug via INTRAVENOUS
  Administered 2012-06-23: 40 ug via INTRAVENOUS
  Administered 2012-06-23 (×2): 80 ug via INTRAVENOUS
  Administered 2012-06-23: 120 ug via INTRAVENOUS
  Administered 2012-06-23: 80 ug via INTRAVENOUS
  Administered 2012-06-23: 120 ug via INTRAVENOUS

## 2012-06-23 MED ORDER — FENTANYL 2.5 MCG/ML BUPIVACAINE 1/10 % EPIDURAL INFUSION (WH - ANES)
14.0000 mL/h | INTRAMUSCULAR | Status: DC
  Filled 2012-06-23: qty 125

## 2012-06-23 MED ORDER — OXYTOCIN 10 UNIT/ML IJ SOLN
40.0000 [IU] | INTRAVENOUS | Status: DC | PRN
  Administered 2012-06-23: 40 [IU] via INTRAVENOUS

## 2012-06-23 MED ORDER — PHENYLEPHRINE 40 MCG/ML (10ML) SYRINGE FOR IV PUSH (FOR BLOOD PRESSURE SUPPORT)
PREFILLED_SYRINGE | INTRAVENOUS | Status: AC
  Filled 2012-06-23: qty 5

## 2012-06-23 MED ORDER — PHENYLEPHRINE 40 MCG/ML (10ML) SYRINGE FOR IV PUSH (FOR BLOOD PRESSURE SUPPORT): 80.0000 ug | PREFILLED_SYRINGE | INTRAVENOUS | Status: DC | PRN

## 2012-06-23 MED ORDER — MEPERIDINE HCL 25 MG/ML IJ SOLN: 6.2500 mg | INTRAMUSCULAR | Status: DC | PRN

## 2012-06-23 MED ORDER — SODIUM BICARBONATE 8.4 % IV SOLN
INTRAVENOUS | Status: DC | PRN
  Administered 2012-06-23: 5 mL via EPIDURAL

## 2012-06-23 MED ORDER — LACTATED RINGERS IV SOLN
INTRAVENOUS | Status: DC | PRN
  Administered 2012-06-23: 23:00:00 via INTRAVENOUS

## 2012-06-23 MED ORDER — FENTANYL CITRATE 0.05 MG/ML IJ SOLN: 25.0000 ug | INTRAMUSCULAR | Status: DC | PRN

## 2012-06-23 MED ORDER — EPHEDRINE 5 MG/ML INJ: 10.0000 mg | INTRAVENOUS | Status: DC | PRN

## 2012-06-23 MED ORDER — DEXTROSE 5 % IV SOLN
2.0000 g | Freq: Once | INTRAVENOUS | Status: AC
  Administered 2012-06-23: 2 g via INTRAVENOUS
  Filled 2012-06-23: qty 2

## 2012-06-23 MED ORDER — FENTANYL 2.5 MCG/ML BUPIVACAINE 1/10 % EPIDURAL INFUSION (WH - ANES)
INTRAMUSCULAR | Status: DC | PRN
  Administered 2012-06-23: 14 mL/h via EPIDURAL

## 2012-06-23 MED ORDER — LACTATED RINGERS IV SOLN: 500.0000 mL | Freq: Once | INTRAVENOUS | Status: DC

## 2012-06-23 MED ORDER — MIDAZOLAM HCL 2 MG/2ML IJ SOLN: 0.5000 mg | Freq: Once | INTRAMUSCULAR | Status: AC | PRN

## 2012-06-23 MED ORDER — BUPIVACAINE HCL (PF) 0.25 % IJ SOLN
INTRAMUSCULAR | Status: AC
  Filled 2012-06-23: qty 30

## 2012-06-23 MED ORDER — BUPIVACAINE HCL (PF) 0.25 % IJ SOLN
INTRAMUSCULAR | Status: DC | PRN
  Administered 2012-06-23: 10 mL

## 2012-06-23 MED ORDER — EPHEDRINE 5 MG/ML INJ
10.0000 mg | INTRAVENOUS | Status: DC | PRN
  Filled 2012-06-23: qty 4

## 2012-06-23 MED ORDER — DIPHENHYDRAMINE HCL 50 MG/ML IJ SOLN: 12.5000 mg | INTRAMUSCULAR | Status: DC | PRN

## 2012-06-23 MED ORDER — MORPHINE SULFATE 0.5 MG/ML IJ SOLN
INTRAMUSCULAR | Status: AC
Start: 2012-06-23 — End: 2012-06-23
  Filled 2012-06-23: qty 20

## 2012-06-23 MED ORDER — ONDANSETRON HCL 4 MG/2ML IJ SOLN
INTRAMUSCULAR | Status: AC
  Filled 2012-06-23: qty 2

## 2012-06-23 SURGICAL SUPPLY — 33 items
CLOTH BEACON ORANGE TIMEOUT ST (SAFETY) ×2 IMPLANT
CONTAINER PREFILL 10% NBF 15ML (MISCELLANEOUS) IMPLANT
DRAPE SURG 17X23 STRL (DRAPES) ×2 IMPLANT
DRESSING TELFA 8X3 (GAUZE/BANDAGES/DRESSINGS) IMPLANT
DRSG COVADERM 4X10 (GAUZE/BANDAGES/DRESSINGS) ×2 IMPLANT
DURAPREP 26ML APPLICATOR (WOUND CARE) ×2 IMPLANT
ELECT REM PT RETURN 9FT ADLT (ELECTROSURGICAL) ×2
ELECTRODE REM PT RTRN 9FT ADLT (ELECTROSURGICAL) ×1 IMPLANT
EXTRACTOR VACUUM M CUP 4 TUBE (SUCTIONS) IMPLANT
GAUZE SPONGE 4X4 12PLY STRL LF (GAUZE/BANDAGES/DRESSINGS) ×4 IMPLANT
GLOVE BIO SURGEON STRL SZ7.5 (GLOVE) ×4 IMPLANT
GOWN PREVENTION PLUS LG XLONG (DISPOSABLE) ×4 IMPLANT
GOWN PREVENTION PLUS XLARGE (GOWN DISPOSABLE) ×2 IMPLANT
KIT ABG SYR 3ML LUER SLIP (SYRINGE) IMPLANT
NEEDLE HYPO 25X1 1.5 SAFETY (NEEDLE) ×2 IMPLANT
NEEDLE HYPO 25X5/8 SAFETYGLIDE (NEEDLE) IMPLANT
NS IRRIG 1000ML POUR BTL (IV SOLUTION) ×2 IMPLANT
PACK C SECTION WH (CUSTOM PROCEDURE TRAY) ×2 IMPLANT
PAD ABD 7.5X8 STRL (GAUZE/BANDAGES/DRESSINGS) ×2 IMPLANT
PAD OB MATERNITY 4.3X12.25 (PERSONAL CARE ITEMS) IMPLANT
SLEEVE SCD COMPRESS KNEE MED (MISCELLANEOUS) IMPLANT
SPONGE GAUZE 4X4 12PLY (GAUZE/BANDAGES/DRESSINGS) ×2 IMPLANT
STAPLER VISISTAT 35W (STAPLE) ×2 IMPLANT
SUT MNCRL 0 VIOLET CTX 36 (SUTURE) ×2 IMPLANT
SUT MON AB 2-0 CT1 27 (SUTURE) ×2 IMPLANT
SUT MON AB-0 CT1 36 (SUTURE) ×4 IMPLANT
SUT MONOCRYL 0 CTX 36 (SUTURE) ×2
SUT PLAIN 0 NONE (SUTURE) IMPLANT
SUT PLAIN 2 0 XLH (SUTURE) IMPLANT
SYR CONTROL 10ML LL (SYRINGE) ×2 IMPLANT
TOWEL OR 17X24 6PK STRL BLUE (TOWEL DISPOSABLE) ×4 IMPLANT
TRAY FOLEY CATH 14FR (SET/KITS/TRAYS/PACK) ×2 IMPLANT
WATER STERILE IRR 1000ML POUR (IV SOLUTION) ×2 IMPLANT

## 2012-06-23 NOTE — Op Note (Signed)
Cesarean Section Procedure Note  Indications: failure to progress: arrest of dilation and non-reassuring fetal status  Pre-operative Diagnosis: 38 week 5 day pregnancy.  Post-operative Diagnosis: same  Surgeon: Lenoard Aden   Assistants: Renae Fickle, CNM  Anesthesia: Epidural anesthesia and Local anesthesia 0.25.% bupivacaine  ASA Class: 2  Procedure Details  The patient was seen in the Holding Room. The risks, benefits, complications, treatment options, and expected outcomes were discussed with the patient.  The patient concurred with the proposed plan, giving informed consent. The risks of anesthesia, infection, bleeding and possible injury to other organs discussed. Injury to bowel, bladder, or ureter with possible need for repair discussed. Possible need for transfusion with secondary risks of hepatitis or HIV acquisition discussed. Post operative complications to include but not limited to DVT, PE and Pneumonia noted. The site of surgery properly noted/marked. The patient was taken to Operating Room # 2, identified as Sheila Nelson and the procedure verified as C-Section Delivery. A Time Out was held and the above information confirmed.  After induction of anesthesia, the patient was draped and prepped in the usual sterile manner. A Pfannenstiel incision was made and carried down through the subcutaneous tissue to the fascia. Fascial incision was made and extended transversely using Mayo scissors. The fascia was separated from the underlying rectus tissue superiorly and inferiorly. The peritoneum was identified and entered. Peritoneal incision was extended longitudinally. The utero-vesical peritoneal reflection was incised transversely and the bladder flap was bluntly freed from the lower uterine segment. A low transverse uterine incision(Kerr hysterotomy) was made. Delivered from OP presentation was a  female with Apgar scores of 8 at one minute and 9 at five minutes. Bulb suctioning gently  performed. Neonatal team in attendance.After the umbilical cord was clamped and cut cord blood was obtained for evaluation. The placenta was removed intact and appeared normal. The uterus was curetted with a dry lap pack. Good hemostasis was noted.The uterine outline, tubes and ovaries appeared normal. The uterine incision was closed with running locked sutures of 0 Monocryl x 2 layers. Hemostasis was observed. Lavage was carried out until clear.The parietal peritoneum was closed with a running 2-0 Monocryl suture. The fascia was then reapproximated with running sutures of 0 Monocryl. McMillin tissue closed with 2-0 plain in a running fashion.The skin was reapproximated with staples.  Instrument, sponge, and needle counts were correct prior the abdominal closure and at the conclusion of the case.   Findings: Above Small fundal subserosal fibroid  Estimated Blood Loss:  300 mL         Drains: foley                 Specimens: placenta                 Complications:  None; patient tolerated the procedure well.         Disposition: PACU - hemodynamically stable.         Condition: stable  Attending Attestation: I performed the procedure.

## 2012-06-23 NOTE — Progress Notes (Signed)
Sheila Nelson is a 51 y.o. G2P0010 at [redacted]w[redacted]d by LMP admitted for induction of labor due to Diabetes and Hypertension.  Subjective: Feels pressure  Objective: BP 142/82  Pulse 81  Temp 97.7 F (36.5 C) (Oral)  Resp 18  Ht 5\' 4"  (1.626 m)  Wt 107.502 kg (237 lb)  BMI 40.68 kg/m2  SpO2 98%  LMP 09/19/2011      FHT:  FHR: 120 bpm, variability: moderate,  accelerations:  Abscent,  decelerations:  Present variables with late return, occ prolonged deceleration UC:   regular, every 2-3 minutes SVE:   Dilation: Lip/rim Effacement (%): 90 Station: +2 Exam by:: C Stoliz RN Reducible lip but no progress and no descent  Labs: Lab Results  Component Value Date   WBC 12.6* 06/23/2012   HGB 12.1 06/23/2012   HCT 35.7* 06/23/2012   MCV 90.6 06/23/2012   PLT 215 06/23/2012    Assessment / Plan: Arrest in active phase of labor Non reassuring fetal heart tracing CHTN  Labor: active phase arrest Preeclampsia:  no signs or symptoms of toxicity, intake and ouput balanced and labs stable Fetal Wellbeing:  Category II and Category III Pain Control:  Epidural I/D:  n/a Anticipated MOD:  Proceed with csec. Risks vs benefits discussed. Consent done. OR notified on Non reassuring status and need to start within 30 minutes.  Ohn Bostic J 06/23/2012, 9:40 PM

## 2012-06-23 NOTE — Anesthesia Procedure Notes (Signed)
Epidural   Needle:  Needle insertion depth: 6 cm Catheter at skin depth: 12 cm  Additional Notes Dosing of Epidural:  1st dose, through catheter ............................................. epi 1:200K + Xylocaine 40 mg  2nd dose, through catheter, after waiting 3 minutes.....epi 1:200K + Xylocaine 60 mg    ( 2% Xylo charted as a single dose in Epic Meds for ease of charting; actual dosing was fractionated as above, for saftey's sake)  As each dose occurred, patient was free of IV sx; and patient exhibited no evidence of SA injection.  Patient is more comfortable after epidural dosed. Please see RN's note for documentation of vital signs,and FHR which are stable.  Patient reminded not to try to ambulate with numb legs, and that an RN must be present when she attempts to get up.       

## 2012-06-23 NOTE — Transfer of Care (Signed)
Immediate Anesthesia Transfer of Care Note  Patient: Sheila Nelson  Procedure(s) Performed: Procedure(s) (LRB) with comments: CESAREAN SECTION (N/A)  Patient Location: PACU  Anesthesia Type: Epidural  Level of Consciousness: awake, alert  and oriented  Airway & Oxygen Therapy: Patient Spontanous Breathing  Post-op Assessment: Report given to PACU RN and Post -op Vital signs reviewed and stable  Post vital signs: Reviewed and stable  Complications: No apparent anesthesia complications

## 2012-06-23 NOTE — Progress Notes (Signed)
Sheila Nelson is a 51 y.o. G2P0010 at [redacted]w[redacted]d by LMP admitted for induction of labor due to Diabetes and Hypertension.  Subjective: Contractions more painful  Objective: BP 151/78  Pulse 61  Temp 98 F (36.7 C) (Oral)  Resp 20  Ht 5\' 4"  (1.626 m)  Wt 107.502 kg (237 lb)  BMI 40.68 kg/m2  LMP 09/19/2011      FHT:  FHR: 155 bpm, variability: moderate,  accelerations:  Present,  decelerations:  Absent UC:   irregular, every 2-4 minutes SVE:   Dilation: 2.5 Effacement (%): 90 Station: 0 Exam by:: Dr Billy Coast IUPC placed without difficulty  Labs: Lab Results  Component Value Date   WBC 9.3 06/22/2012   HGB 11.2* 06/22/2012   HCT 33.4* 06/22/2012   MCV 91.3 06/22/2012   PLT 218 06/22/2012    Assessment / Plan: Induction of labor due to gestational diabetes and CHTN,  progressing well on pitocin  Labor: Progressing normally Preeclampsia:  no signs or symptoms of toxicity, intake and ouput balanced and labs stable Fetal Wellbeing:  Category I Pain Control:  Had Stadol, now will proceed with Epidural I/D:  n/a Anticipated MOD:  NSVD  Sheila Nelson J 06/23/2012, 2:19 PM

## 2012-06-23 NOTE — H&P (Signed)
Sheila Nelson, Sheila Nelson             ACCOUNT NO.:  [id number]  MEDICAL RECORD NO.:  [id number]  LOCATION:  9169                          FACILITY:  [facility]  PHYSICIAN:  Lenoard Aden, M.D.DATE OF BIRTH:  Dec 20, 1960  DATE OF ADMISSION:  06/22/2012 DATE OF DISCHARGE:                             HISTORY & PHYSICAL   CHIEF COMPLAINT:  Chronic hypertension, class A2 diabetes mellitus at 39 weeks for induction.  She is a 51 year old white female, G2, P0 at 34 and 5/7th weeks for induction, gestation with worsening chronic hypertension, class A2 diabetes mellitus for induction at 38 weeks and 5/7th days.  ALLERGIES:  She has no known drug allergies.  MEDICATIONS:  Prenatal vitamins, glyburide, labetalol 200 mg p.o. t.i.d., Valtrex, Ambien as needed, Nexium as needed, extra folic acid, and magnesium citrate as needed, Claritin as needed.  SOCIAL HISTORY:  Noncontributory.  MEDICAL HISTORY:  Remarkable for class A2 diabetes mellitus, chronic hypertension, and HSV without lesions noted during pregnancy.  Prenatal course complicated as noted by chronic hypertension.  No evidence of superimposed preeclampsia, well-controlled diabetes with appropriate grown fetus and no HSV outbreaks.  PHYSICAL EXAMINATION:  GENERAL:  She is a well-developed, well- nourished, white female, in no acute distress. VITAL SIGNS:  Stable.  The patient is afebrile.  Blood pressure 140/80. HEENT:  Normal. NECK:  Supple.  Full range of motion. LUNGS:  Clear. HEART:  Regular rhythm. ABDOMEN:  Soft, gravid, nontender. PELVIC:  Estimated fetal weight 6-1/2 to 7 pounds.  Cervix is closed, 60%, vertex, -1. EXTREMITIES:  There are no cords. NEUROLOGIC:  Nonfocal. SKIN:  Intact.  CMP, CBC, uric acid, LDH pending.  NST reactive.  Cytotec placed.  IMPRESSION: 1. A 38 and 5/7th week intrauterine pregnancy. 2. Chronic hypertension with exacerbation. 3. Class A2 diabetes mellitus. 4. Advanced maternal age with donor  egg in vitro fertilization. 5. History of herpes simplex virus on Valtrex with no outbreak. 6. GBS positive.  PLAN:  Admit, check labs, Cytotec for cervical ripening, Pitocin in a.m. Monitor signs and symptoms of preeclampsia.  Anticipate cautious attempts at vaginal delivery.     Lenoard Aden, M.D.     RJT/MEDQ  D:  06/22/2012  T:  06/23/2012  Job:  [id number]

## 2012-06-23 NOTE — Progress Notes (Signed)
Sheila Nelson is a 51 y.o. G2P0010 at [redacted]w[redacted]d by LMP admitted for induction of labor due to Diabetes and Hypertension.  Subjective: Coping well  Objective: BP 144/83  Pulse 68  Temp 98.5 F (36.9 C) (Oral)  Resp 20  Ht 5\' 4"  (1.626 m)  Wt 107.502 kg (237 lb)  BMI 40.68 kg/m2  LMP 09/19/2011      FHT:  FHR: 145 bpm, variability: moderate,  accelerations:  Present,  decelerations:  Absent UC:   regular, every 3 minutes SVE:   Dilation: 2.5 Effacement (%): 80 Station: -1;0 Exam by:: Dr Sheila Nelson AROM- clear  Labs: Lab Results  Component Value Date   WBC 9.3 06/22/2012   HGB 11.2* 06/22/2012   HCT 33.4* 06/22/2012   MCV 91.3 06/22/2012   PLT 218 06/22/2012    Assessment / Plan: Induction of labor due to gestational hypertension and gestational diabetes,  progressing well on pitocin  Labor: Progressing normally Preeclampsia:  no signs or symptoms of toxicity, intake and ouput balanced and labs stable Fetal Wellbeing:  Category I Pain Control:  Labor support without medications I/D:  n/a Anticipated MOD:  NSVD  Sheila Nelson 06/23/2012, 10:57 AM

## 2012-06-23 NOTE — Progress Notes (Signed)
Sheila Nelson is a 51 y.o. G2P0010 at 100w6d by LMP admitted for induction of labor due to Diabetes and Hypertension.  Subjective: Feels occ pressure  Objective: BP 152/87  Pulse 59  Temp 97.7 F (36.5 C) (Oral)  Resp 20  Ht 5\' 4"  (1.626 m)  Wt 107.502 kg (237 lb)  BMI 40.68 kg/m2  SpO2 98%  LMP 09/19/2011      FHT:  FHR: 100-110 bpm, variability: moderate,  accelerations:  Present,  decelerations:  Present occ variable decel UC:   regular, every 2-4 minutes SVE:   Dilation: Lip/rim Effacement (%): 90 Station: +1;+2 Exam by:: Larose Kells RN Easily reducible cervix. OT.  Labs: Lab Results  Component Value Date   WBC 12.6* 06/23/2012   HGB 12.1 06/23/2012   HCT 35.7* 06/23/2012   MCV 90.6 06/23/2012   PLT 215 06/23/2012  BS 98  Assessment / Plan: Induction of labor due to gestational hypertension and gestational diabetes,  progressing well on pitocin AMA with donor egg GBS positive  Labor: Progressing normally Preeclampsia:  no signs or symptoms of toxicity, intake and ouput balanced and labs stable Fetal Wellbeing:  Category I and Category II Pain Control:  Epidural I/D:  n/a Anticipated MOD:  Place in exaggerated Sims, cautious attempt to VD  Mashal Slavick J 06/23/2012, 7:46 PM

## 2012-06-23 NOTE — Progress Notes (Signed)
Sheila Nelson is a 51 y.o. G2P0010 at [redacted]w[redacted]d by LMP admitted for induction of labor due to Diabetes and Hypertension.  Subjective: Comfortable  Objective: BP 137/78  Pulse 64  Temp 98 F (36.7 C) (Oral)  Resp 20  Ht 5\' 4"  (1.626 m)  Wt 107.502 kg (237 lb)  BMI 40.68 kg/m2  SpO2 98%  LMP 09/19/2011      FHT:  FHR: 125 bpm, variability: moderate,  accelerations:  Present,  decelerations:  Present occ mild variable deceleration UC:   regular, every 2-3 minutes SVE:   Dilation: 6 Effacement (%): 90 Station: 0 Exam by:: Dr Billy Coast Excellent scalp stimulation,   Labs: Lab Results  Component Value Date   WBC 12.6* 06/23/2012   HGB 12.1 06/23/2012   HCT 35.7* 06/23/2012   MCV 90.6 06/23/2012   PLT 215 06/23/2012    Assessment / Plan: Induction of labor due to gestational hypertension and gestational diabetes,  progressing well on pitocin  Labor: Progressing normally Preeclampsia:  no signs or symptoms of toxicity, intake and ouput balanced and labs stable Fetal Wellbeing:  Category I Pain Control:  Epidural I/D:  n/a Anticipated MOD:  NSVD Instructed RN to restart Pitocin at 9mU  Sheila Nelson 06/23/2012, 5:01 PM

## 2012-06-23 NOTE — Anesthesia Preprocedure Evaluation (Addendum)
Anesthesia Evaluation  Patient identified by MRN, date of birth, ID band Patient awake    Reviewed: Allergy & Precautions, H&P , Patient's Chart, lab work & pertinent test results  Airway Mallampati: III TM Distance: >3 FB Neck ROM: full    Dental  (+) Teeth Intact   Pulmonary asthma ,  breath sounds clear to auscultation        Cardiovascular hypertension, On Home Beta Blockers Rhythm:regular Rate:Normal     Neuro/Psych    GI/Hepatic GERD-  Medicated,  Endo/Other  diabetes, Type 2, Oral Hypoglycemic Agents  Renal/GU      Musculoskeletal   Abdominal   Peds  Hematology   Anesthesia Other Findings  HTN- chronic Asthma DM GERD AMA- 51yo MO     Reproductive/Obstetrics (+) Pregnancy                           Anesthesia Physical Anesthesia Plan  ASA: III  Anesthesia Plan: Epidural   Post-op Pain Management:    Induction:   Airway Management Planned:   Additional Equipment:   Intra-op Plan:   Post-operative Plan:   Informed Consent: I have reviewed the patients History and Physical, chart, labs and discussed the procedure including the risks, benefits and alternatives for the proposed anesthesia with the patient or authorized representative who has indicated his/her understanding and acceptance.   Dental Advisory Given  Plan Discussed with:   Anesthesia Plan Comments: (Labs checked- platelets confirmed with RN in room. Fetal heart tracing, per RN, reported to be stable enough for sitting procedure. Discussed epidural, and patient consents to the procedure:  included risk of possible headache,backache, failed block, allergic reaction, and nerve injury. This patient was asked if she had any questions or concerns before the procedure started. )        Anesthesia Quick Evaluation

## 2012-06-24 ENCOUNTER — Encounter (HOSPITAL_COMMUNITY): Payer: Self-pay

## 2012-06-24 LAB — CBC
HCT: 31.5 % — ABNORMAL LOW (ref 36.0–46.0)
Hemoglobin: 10.9 g/dL — ABNORMAL LOW (ref 12.0–15.0)
MCHC: 34.6 g/dL (ref 30.0–36.0)
RBC: 3.47 MIL/uL — ABNORMAL LOW (ref 3.87–5.11)
WBC: 14.5 10*3/uL — ABNORMAL HIGH (ref 4.0–10.5)

## 2012-06-24 MED ORDER — SODIUM CHLORIDE 0.9 % IJ SOLN: 3.0000 mL | INTRAMUSCULAR | Status: DC | PRN

## 2012-06-24 MED ORDER — PANTOPRAZOLE SODIUM 40 MG PO TBEC
40.0000 mg | DELAYED_RELEASE_TABLET | Freq: Every day | ORAL | Status: DC
  Administered 2012-06-24 – 2012-06-25 (×2): 40 mg via ORAL
  Filled 2012-06-24 (×3): qty 1

## 2012-06-24 MED ORDER — ONDANSETRON HCL 4 MG PO TABS: 4.0000 mg | ORAL_TABLET | ORAL | Status: DC | PRN

## 2012-06-24 MED ORDER — LANOLIN HYDROUS EX OINT: 1.0000 "application " | TOPICAL_OINTMENT | CUTANEOUS | Status: DC | PRN

## 2012-06-24 MED ORDER — METHYLERGONOVINE MALEATE 0.2 MG/ML IJ SOLN: 0.2000 mg | INTRAMUSCULAR | Status: DC | PRN

## 2012-06-24 MED ORDER — ACETAMINOPHEN 10 MG/ML IV SOLN
1000.0000 mg | Freq: Four times a day (QID) | INTRAVENOUS | Status: AC | PRN
  Filled 2012-06-24: qty 100

## 2012-06-24 MED ORDER — RHO D IMMUNE GLOBULIN 1500 UNIT/2ML IJ SOLN
300.0000 ug | Freq: Once | INTRAMUSCULAR | Status: AC
  Administered 2012-06-24: 300 ug via INTRAMUSCULAR
  Filled 2012-06-24: qty 2

## 2012-06-24 MED ORDER — DIPHENHYDRAMINE HCL 25 MG PO CAPS: 25.0000 mg | ORAL_CAPSULE | ORAL | Status: DC | PRN

## 2012-06-24 MED ORDER — TETANUS-DIPHTH-ACELL PERTUSSIS 5-2.5-18.5 LF-MCG/0.5 IM SUSP: 0.5000 mL | Freq: Once | INTRAMUSCULAR | Status: DC

## 2012-06-24 MED ORDER — ONDANSETRON HCL 4 MG/2ML IJ SOLN
4.0000 mg | Freq: Three times a day (TID) | INTRAMUSCULAR | Status: DC | PRN
  Filled 2012-06-24: qty 2

## 2012-06-24 MED ORDER — SIMETHICONE 80 MG PO CHEW
80.0000 mg | CHEWABLE_TABLET | ORAL | Status: DC | PRN
  Administered 2012-06-24 (×2): 80 mg via ORAL

## 2012-06-24 MED ORDER — METHYLERGONOVINE MALEATE 0.2 MG PO TABS: 0.2000 mg | ORAL_TABLET | ORAL | Status: DC | PRN

## 2012-06-24 MED ORDER — DIPHENHYDRAMINE HCL 50 MG/ML IJ SOLN: 25.0000 mg | INTRAMUSCULAR | Status: DC | PRN

## 2012-06-24 MED ORDER — METOCLOPRAMIDE HCL 5 MG/ML IJ SOLN: 10.0000 mg | Freq: Three times a day (TID) | INTRAMUSCULAR | Status: DC | PRN

## 2012-06-24 MED ORDER — PRENATAL MULTIVITAMIN CH
1.0000 | ORAL_TABLET | Freq: Every day | ORAL | Status: DC
  Administered 2012-06-24 – 2012-06-25 (×2): 1 via ORAL
  Filled 2012-06-24 (×2): qty 1

## 2012-06-24 MED ORDER — LABETALOL HCL 200 MG PO TABS
200.0000 mg | ORAL_TABLET | Freq: Two times a day (BID) | ORAL | Status: DC
Start: 2012-06-24 — End: 2012-06-25
  Administered 2012-06-24 – 2012-06-25 (×2): 200 mg via ORAL
  Filled 2012-06-24 (×4): qty 1

## 2012-06-24 MED ORDER — MENTHOL 3 MG MT LOZG: 1.0000 | LOZENGE | OROMUCOSAL | Status: DC | PRN

## 2012-06-24 MED ORDER — OXYTOCIN 40 UNITS IN LACTATED RINGERS INFUSION - SIMPLE MED: 62.5000 mL/h | INTRAVENOUS | Status: AC

## 2012-06-24 MED ORDER — SODIUM CHLORIDE 0.9 % IV SOLN
1.0000 ug/kg/h | INTRAVENOUS | Status: DC | PRN
  Filled 2012-06-24: qty 2.5

## 2012-06-24 MED ORDER — OXYCODONE-ACETAMINOPHEN 5-325 MG PO TABS: 1.0000 | ORAL_TABLET | ORAL | Status: DC | PRN

## 2012-06-24 MED ORDER — ZOLPIDEM TARTRATE 5 MG PO TABS: 5.0000 mg | ORAL_TABLET | Freq: Every evening | ORAL | Status: DC | PRN

## 2012-06-24 MED ORDER — NALOXONE HCL 0.4 MG/ML IJ SOLN: 0.4000 mg | INTRAMUSCULAR | Status: DC | PRN

## 2012-06-24 MED ORDER — SIMETHICONE 80 MG PO CHEW
80.0000 mg | CHEWABLE_TABLET | Freq: Three times a day (TID) | ORAL | Status: DC
  Administered 2012-06-24 – 2012-06-25 (×6): 80 mg via ORAL

## 2012-06-24 MED ORDER — LABETALOL HCL 200 MG PO TABS
200.0000 mg | ORAL_TABLET | Freq: Three times a day (TID) | ORAL | Status: DC
  Filled 2012-06-24: qty 1

## 2012-06-24 MED ORDER — NALBUPHINE HCL 10 MG/ML IJ SOLN
5.0000 mg | INTRAMUSCULAR | Status: DC | PRN
  Filled 2012-06-24: qty 1

## 2012-06-24 MED ORDER — SCOPOLAMINE 1 MG/3DAYS TD PT72
MEDICATED_PATCH | TRANSDERMAL | Status: AC
  Administered 2012-06-24: 1.5 mg via TRANSDERMAL
  Filled 2012-06-24: qty 1

## 2012-06-24 MED ORDER — LACTATED RINGERS IV SOLN: INTRAVENOUS | Status: DC

## 2012-06-24 MED ORDER — DIPHENHYDRAMINE HCL 25 MG PO CAPS: 25.0000 mg | ORAL_CAPSULE | Freq: Four times a day (QID) | ORAL | Status: DC | PRN

## 2012-06-24 MED ORDER — WITCH HAZEL-GLYCERIN EX PADS: 1.0000 "application " | MEDICATED_PAD | CUTANEOUS | Status: DC | PRN

## 2012-06-24 MED ORDER — IBUPROFEN 600 MG PO TABS
600.0000 mg | ORAL_TABLET | Freq: Four times a day (QID) | ORAL | Status: DC
  Administered 2012-06-24 – 2012-06-25 (×6): 600 mg via ORAL
  Filled 2012-06-24 (×6): qty 1

## 2012-06-24 MED ORDER — DIBUCAINE 1 % RE OINT: 1.0000 "application " | TOPICAL_OINTMENT | RECTAL | Status: DC | PRN

## 2012-06-24 MED ORDER — DIPHENHYDRAMINE HCL 50 MG/ML IJ SOLN: 12.5000 mg | INTRAMUSCULAR | Status: DC | PRN

## 2012-06-24 MED ORDER — ONDANSETRON HCL 4 MG/2ML IJ SOLN
4.0000 mg | INTRAMUSCULAR | Status: DC | PRN
  Administered 2012-06-24: 4 mg via INTRAVENOUS

## 2012-06-24 MED ORDER — SENNOSIDES-DOCUSATE SODIUM 8.6-50 MG PO TABS
2.0000 | ORAL_TABLET | Freq: Every day | ORAL | Status: DC
  Administered 2012-06-24: 2 via ORAL

## 2012-06-24 NOTE — Anesthesia Postprocedure Evaluation (Signed)
Anesthesia Post Note  Patient: Sheila Nelson  Procedure(s) Performed: Procedure(s) (LRB): CESAREAN SECTION (N/A)  Anesthesia type: Epidural  Patient location: PACU  Post pain: Pain level controlled  Post assessment: Post-op Vital signs reviewed  Last Vitals:  Filed Vitals:   06/24/12 0245  BP: 148/81  Pulse: 71  Temp: 36.9 C  Resp: 18    Post vital signs: Reviewed  Level of consciousness: awake  Complications: No apparent anesthesia complications

## 2012-06-24 NOTE — Progress Notes (Signed)
Pt refusing pulse ox monitor, scd's, and IV fluids. Stated upon admission to [facility]bu that her IV was infiltrated and burning, flushed well and looked fine. Finished bag of pitocin and then saline locked IV. Pt then stated it was still burning so I discontinued IV. Encouraged patient to continue to drink fluids, currently has adequate output in foley. Told pt about possibly restarting IV if she did not have adequate output. Removed pulse ox monitor and SCD's stating she doesn't need them. Educated pt on need for these, will spot check O2 sats with vital signs.

## 2012-06-24 NOTE — Progress Notes (Signed)
Patient ID: Sheila Nelson, female   DOB: December 07, 1960, 51 y.o.   MRN: [id number] POD # 1  Subjective: Pt reports feeling well; tired/ Pain controlled with ibuprofen Tolerating po/ Foley patent/ Mod nausea, however, improving and tolerating Cl liq well/Flatus neg Activity: ad lib Bleeding is light Newborn info:  Information for the patient's newborn:  Jae, Bruck [[id number]]  female  / circ not planned/ Feeding: breast   Objective: VS: Blood pressure 133/72, pulse 62, temperature 97.9 F (36.6 C), temperature source Oral, resp. rate 20.    Intake/Output Summary (Last 24 hours) at 06/24/12 1025 Last data filed at 06/24/12 9629  Gross per 24 hour  Intake   1700 ml  Output   2350 ml  Net   -650 ml      Basename 06/24/12 0515 06/23/12 1423  WBC 14.5* 12.6*  HGB 10.9* 12.1  HCT 31.5* 35.7*  PLT 203 215    Blood type: --/--/B NEG (10/25 0515) Rubella: Immune (03/26 0000)    Physical Exam:  General: alert, cooperative and no distress CV: Regular rate and rhythm Resp: clear Abdomen: Soft, NT, faint, hypoactive BS Incision: covered with dsg/ C/D/I Uterine Fundus: firm, below umbilicus, nontender Lochia: minimal Ext: edema +3 pedal and pretib edema and Homans sign is negative, no sign of DVT    A/P: POD # 1/ G2P1011 S/P C/Section d/t FTP and FITL Hx mild cHTN, worsened during pregnancy.  BP's improved now; will decrease labetalol and continue to monitor I&O and BP's Hx GERD; will add protonix per pt request Doing well Continue routine post op orders   Signed: Demetrius Revel, MSN, [facility] 06/24/2012, 10:25 AM

## 2012-06-24 NOTE — Addendum Note (Signed)
Addendum  created 06/24/12 0755 by Jhonnie Garner, CRNA   Modules edited:Notes Section

## 2012-06-24 NOTE — Anesthesia Postprocedure Evaluation (Signed)
Anesthesia Post Note  Patient: Sheila Nelson  Procedure(s) Performed: Procedure(s) (LRB): CESAREAN SECTION (N/A)  Anesthesia type: Epidural  Patient location: Mother/Baby  Post pain: Pain level controlled  Post assessment: Post-op Vital signs reviewed  Last Vitals:  Filed Vitals:   06/24/12 0638  BP: 111/55  Pulse: 61  Temp: 36.6 C  Resp: 20    Post vital signs: Reviewed  Level of consciousness: awake  Complications: No apparent anesthesia complications

## 2012-06-25 ENCOUNTER — Encounter (HOSPITAL_COMMUNITY): Payer: Self-pay

## 2012-06-25 LAB — RH IG WORKUP (INCLUDES ABO/RH)
ABO/RH(D): B NEG
Fetal Screen: NEGATIVE
Gestational Age(Wks): 38.6
Unit division: 0

## 2012-06-25 MED ORDER — HYDROCHLOROTHIAZIDE 12.5 MG PO CAPS: 12.5000 mg | ORAL_CAPSULE | Freq: Every day | ORAL | Status: DC

## 2012-06-25 MED ORDER — IBUPROFEN 600 MG PO TABS: 600.0000 mg | ORAL_TABLET | Freq: Four times a day (QID) | ORAL | Status: DC

## 2012-06-25 MED ORDER — OXYCODONE-ACETAMINOPHEN 5-325 MG PO TABS: 1.0000 | ORAL_TABLET | ORAL | Status: DC | PRN

## 2012-06-25 NOTE — Discharge Summary (Signed)
Obstetric Discharge Summary Reason for Admission: induction of labor for postdates. Prenatal Procedures: NST and ultrasound Intrapartum Procedures: spontaneous vaginal delivery Postpartum Procedures: none Complications-Operative and Postpartum: 2nd degree perineal laceration Hemoglobin  Date Value Range Status  06/24/2012 10.9* 12.0 - 15.0 g/dL Final     HCT  Date Value Range Status  06/24/2012 31.5* 36.0 - 46.0 % Final    Physical Exam:  Filed Vitals:   06/25/12 0952  BP: 126/80  Pulse: 75  Temp:   Resp:     General: alert, cooperative and appears stated age Lochia: appropriate Uterine Fundus: firm Incision: n/a DVT Evaluation: No evidence of DVT seen on physical exam. Negative Homan's sign.   Discharge Diagnoses: Term Pregnancy-delivered  Discharge Information: Date: 06/25/2012 Activity: pelvic rest Diet: routine Medications: Tylenol #3 and Ibuprofen Condition: stable Instructions: refer to practice specific booklet Discharge to: home Follow-up Information    Follow up with Lenoard Aden, MD. Schedule an appointment as soon as possible for a visit in 6 weeks.   Contact information:   [address] [place] [address] [phone]          Newborn Data: Live born female  Birth Weight: 6 lb 14.8 oz (3140 g) APGAR: 8, 9  Home with mother.  [facility] 06/25/2012, 1:50 PM

## 2012-06-25 NOTE — Progress Notes (Signed)
POSTOPERATIVE DAY # 2 S/P cesarean section   S:         Reports feeling ready to go home - no rest with staff interruptions             Tolerating po intake / no  nausea / no  vomiting / + flatus / no  BM             Bleeding is spotting             Pain controlled with motrin and percocet             Up ad lib / ambulatory/ voiding QS  Newborn breast feeding  / Circumcision done   O:  VS: BP 126/80  Pulse 75  Temp 97.6 F (36.4 C) (Oral)  Resp 18  Ht 5\' 4"  (1.626 m)  Wt 107.502 kg (237 lb)  BMI 40.68 kg/m2  SpO2 98%  LMP 09/19/2011  Breastfeeding? Unknown   LABS:  Basename 06/24/12 0515 06/23/12 1423  WBC 14.5* 12.6*  HGB 10.9* 12.1  PLT 203 215                                         Physical Exam:             Alert and Oriented X3  Lungs: Clear and unlabored  Heart: regular rate and rhythm  Abdomen: soft, non-tender, non-distended             Fundus: firm, non-tender, U-1             Dressing oFF              Incision:  approximated with staples /  No erythema / no ecchymosis / no drainage  Perineum: no edema  Lochia: light  Extremities: 2+ edema, no calf pain or tenderness, negative Homans  A:        POD # 2 S/P cesarean section            Dependent edema            GDM-A2 - delivered / no meds postpartum            Chronic hypertension - stable on labetalol  P:        Routine postoperative care              Discharge home today   Marlinda Mike CNM, MSN 06/25/2012, 11:32 AM

## 2012-06-25 NOTE — Discharge Summary (Signed)
POSTOPERATIVE DISCHARGE SUMMARY:  Patient ID: Sheila Nelson MRN: [id number] DOB/AGE: [birth year] 49 y.o.  Admit date: 06/22/2012 Admission Diagnoses: 38 5/7 with chronic hypertension/GDM-A2/AMA-infertility with egg donor     Discharge date:  06/25/2012 Discharge Diagnoses: POD 2 s/p cesarean section / chronic hypertension / GDMA2 - delivered  Prenatal history: G2P1011   EDC : 07/01/2012, by Other Basis  Prenatal care at [facility]  Primary provider : [place] Prenatal course complicated by infertility - egg donor pregnancy / AMA / chronic hypertension / GDM  Prenatal Labs: ABO, Rh: B (03/26 0000) Negative - received Rhogam postpartum Antibody: POS (10/23 2015) Rubella: Immune (03/26 0000)  RPR: NON REACTIVE (10/23 2015)  HBsAg: Negative (03/26 0000)  HIV: Non-reactive (03/26 0000)  GBS: Positive (03/26 0000)  1 hr Glucola : abnormal - GDM   Medical / Surgical History :  Past medical history:  Past Medical History  Diagnosis Date  . Acute bronchitis 05/20/2010  . ADVANCED MATERNAL AGE 37/05/2010  . ALLERGIC RHINITIS, SEASONAL 10/09/2009  . ASTHMA UNSPECIFIED WITH EXACERBATION 05/20/2010  . HYPERTENSION 10/09/2009  . PLANTAR FASCIITIS, RIGHT 09/30/2010  . REACTIVE AIRWAY DISEASE 10/09/2009  . TINNITUS 10/09/2009  . Pancreatitis     Viral  . GERD (gastroesophageal reflux disease)   . Hx of viral meningitis   . Dysrhythmia     palpitations  . GDM (gestational diabetes mellitus)     glyburide  . Herpes   . H/O varicella   . Obese   . Gestational diabetes     glyburide  . Postpartum care following cesarean delivery 06/23/12) 06/24/2012    Past surgical history:  Past Surgical History  Procedure Date  . Appendectomy   . Right thigh surgery 1973    Trauma  . Inguinal hernia repair     Infant, bilateral  . Dilation and curettage of uterus   . Mouth surgery     Family History:  Family History  Problem Relation Age of Onset  . Prostate cancer Father    . Stroke Father 45  . Heart failure Father   . Hypertension Father   . Cancer Father     prostate  . Valvular heart disease Mother     MVR  . Thrombocytopenia Mother   . Birth defects Sister     tetralogy of falot  . Cancer Brother     prostate  . Stroke Maternal Grandmother     Social History:  reports that she has never smoked. She has never used smokeless tobacco. She reports that she drinks alcohol. She reports that she does not use illicit drugs.   Allergies: Review of patient's allergies indicates no known allergies.    Current Medications at time of admission:  Prior to Admission medications   Medication Sig Start Date End Date Taking? Authorizing Provider  Docosahexaenoic Acid (DHA OMEGA 3 PO) Take 2 capsules by mouth daily.   Yes Historical Provider, MD  esomeprazole (NEXIUM) 20 MG capsule Take 20 mg by mouth daily before breakfast.   Yes Historical Provider, MD  folic acid (FOLVITE) 400 MCG tablet Take 400 mcg by mouth daily.   Yes Historical Provider, MD  labetalol (NORMODYNE) 200 MG tablet Take 200 mg by mouth 3 (three) times daily.   Yes Historical Provider, MD  loratadine (CLARITIN) 10 MG tablet Take 10 mg by mouth daily.   Yes Historical Provider, MD  MAGNESIUM PO Take 150 mg by mouth 2 (two) times daily.   Yes Historical Provider, MD  montelukast (SINGULAIR) 10 MG tablet Take 10 mg by mouth daily.   Yes Historical Provider, MD  Prenatal Vit-Fe Fumarate-FA (PRENATAL MULTIVITAMIN) TABS Take 1 tablet by mouth daily.   Yes Historical Provider, MD  sodium chloride (OCEAN) 0.65 % nasal spray Place 2 sprays into the nose daily as needed. For congestion   Yes Historical Provider, MD  zolpidem (AMBIEN) 10 MG tablet Take 10 mg by mouth at bedtime as needed. For sleep   Yes Historical Provider, MD                   Intrapartum Course: admitted for IOL - cytotec ripening followed by low dose pitocin protocol / iV stadol then epidural for pain management in labor / arrest  of active labor and descent  Procedures: Cesarean section delivery of female newborn by Dr Billy Coast  See operative report for further details APGAR (1 MIN): 8   APGAR (5 MINS): 9    Postoperative / postpartum course: dependent edema - started on HCTZ  Physical Exam:   VSS: Temp:  [97.6 F (36.4 C)-98 F (36.7 C)] 97.6 F (36.4 C) (10/26 0635) Pulse Rate:  [64-75] 75  (10/26 0952) Resp:  [18-20] 18  (10/26 0635) BP: (96-126)/(55-81) 126/80 mmHg (10/26 0952) SpO2:  [98 %] 98 % (10/25 1800)  LABS:  Basename 06/24/12 0515 06/23/12 1423  WBC 14.5* 12.6*  HGB 10.9* 12.1  PLT 203 215    General: pleasant NAD Heart: RRR Lungs:clear unlabored Abdomen: soft / active BS / non-tender uterus Extremities: dependent edema 2+ mid-thigh  Incision:  approximated with staples / no erythema / no ecchymosis / no drainage Staples: intact for removal at [facility] next week  Discharge Instructions:  Discharged Condition: stable Activity: pelvic rest and postoperative restrictions x 2 weeks Diet: routine Medications: see below   Medication List     As of 06/25/2012 11:39 AM    STOP taking these medications         glyBURIDE 1.25 MG tablet   Commonly known as: DIABETA      valACYclovir 500 MG tablet   Commonly known as: VALTREX      TAKE these medications         DHA OMEGA 3 PO   Take 2 capsules by mouth daily.      esomeprazole 20 MG capsule   Commonly known as: NEXIUM   Take 20 mg by mouth daily before breakfast.      folic acid 400 MCG tablet   Commonly known as: FOLVITE   Take 400 mcg by mouth daily.      ibuprofen 600 MG tablet   Commonly known as: ADVIL,MOTRIN   Take 1 tablet (600 mg total) by mouth every 6 (six) hours.      labetalol 200 MG tablet   Commonly known as: NORMODYNE   Take 200 mg by mouth 3 (three) times daily.      loratadine 10 MG tablet   Commonly known as: CLARITIN   Take 10 mg by mouth daily.      MAGNESIUM PO   Take 150 mg by mouth 2 (two) times  daily.      montelukast 10 MG tablet   Commonly known as: SINGULAIR   Take 10 mg by mouth daily.      oxyCODONE-acetaminophen 5-325 MG per tablet   Commonly known as: PERCOCET/ROXICET   Take 1 tablet by mouth every 4 (four) hours as needed (moderate - severe pain).  prenatal multivitamin Tabs   Take 1 tablet by mouth daily.      sodium chloride 0.65 % nasal spray   Commonly known as: OCEAN   Place 2 sprays into the nose daily as needed. For congestion      zolpidem 10 MG tablet   Commonly known as: AMBIEN   Take 10 mg by mouth at bedtime as needed. For sleep         HCTZ 12.5 mg po daily in am x 7 days  Postpartum Instructions: [facility] discharge booklet - instructions reviewed Discharge to: Home  Follow up :   [facility] in 6 weeks for routine postpartum visit and Sugar test.                      Interval visit at [facility]  in 2 days for staple removal and BP check.  Signed: Marlinda Mike CNM, MSN 06/25/2012, 11:39 AM

## 2012-06-27 ENCOUNTER — Encounter (HOSPITAL_COMMUNITY): Payer: Self-pay | Admitting: Obstetrics and Gynecology

## 2012-06-27 NOTE — Progress Notes (Signed)
Post discharge chart review completed.  

## 2012-07-01 ENCOUNTER — Ambulatory Visit (HOSPITAL_COMMUNITY): Admission: RE | Admit: 2012-07-01 | Payer: 59 | Source: Ambulatory Visit

## 2012-07-04 ENCOUNTER — Ambulatory Visit (HOSPITAL_COMMUNITY)
Admission: RE | Admit: 2012-07-04 | Discharge: 2012-07-04 | Disposition: A | Discharge status: Home / Self Care | Payer: 59 | Source: Ambulatory Visit | Attending: Obstetrics and Gynecology | Admitting: Obstetrics and Gynecology

## 2012-07-04 NOTE — Progress Notes (Signed)
Adult Lactation Consultation Outpatient Visit Note Per Smart [facility] visit Sheila Nelson had gained 6 oz in 5 days. Mom very pleased with that.  Patient Name: Sheila Nelson Date of Birth: 14-Mar-1961 Gestational Age at Delivery: [redacted]w[redacted]d Type of Delivery: C/S  Breastfeeding History: Frequency of Breastfeeding: 1 1/2 to 4 hours Length of Feeding: 30 minutes Voids: lots Stools: lots  Supplementing / Method: Pumping:  Type of Pump: Medela PIS   Frequency: 2-3 x/ day  Volume:  1 oz total for all day  Comments:    Consultation Evaluation:  Initial Feeding Assessment: Pre-feed Weight: 6- 12.3  3070g Post-feed Weight: 6-13.0  3090g Amount Transferred: 20cc's Comments: Griffin nursed for 15 minutes with good latch. Many audible swallows noted at the beginning of feeding but after a few minutes Sheila Nelson needed stimulation to continue nursing. Few swallows heard. Mom reports that breast feels much softer after nursing.  Additional Feeding Assessment: Pre-feed Weight: 6-13.0  3090g Post-feed Weight: 6-13.3  3098g Amount Transferred: 8 cc's Comments: Sheila Nelson nursed on the right breast for about 15 minutes. Again few swallows noted after the first few minutes. Mom reports that breast feels much softer.    Total Breast milk Transferred this Visit: 28 cc's Total Supplement Given: 0  Additional Interventions: Reviewed awakening techniques to keep [place] vigorously nursing during the entire feeding. Undressing him, changing diaper, stimulation while nursing. Encouraged mom to pump 4-5 x/day for stimulation after nursing to promote good milk supply. Mom reports that she is only pumping one breast at a time. Encouraged to pump both breasts at the same time to save her time. Reviewed pumping techniques- massage before and compression during pumping to increase milk obtained. No questions at present. To call prn  Follow-Up  To see Ped for 1 month visit. Encouraged to get weight check again either OP  appointment or at [facility] or Ped office sometime next week. Want close follow up to make sure he continues to gain.     Pamelia Hoit 07/04/2012, 4:35 PM

## 2013-09-13 ENCOUNTER — Encounter: Payer: Self-pay | Admitting: Physician Assistant

## 2014-07-02 ENCOUNTER — Encounter (HOSPITAL_COMMUNITY): Payer: Self-pay | Admitting: Obstetrics and Gynecology

## 2014-12-04 ENCOUNTER — Other Ambulatory Visit: Payer: Self-pay | Admitting: Family Medicine

## 2014-12-04 DIAGNOSIS — R1084 Generalized abdominal pain: Secondary | ICD-10-CM

## 2014-12-10 ENCOUNTER — Other Ambulatory Visit: Payer: 59

## 2015-09-01 ENCOUNTER — Emergency Department (HOSPITAL_COMMUNITY)
Admission: EM | Admit: 2015-09-01 | Discharge: 2015-09-02 | Disposition: A | Discharge status: Home / Self Care | Payer: BC Managed Care – PPO | Attending: Emergency Medicine | Admitting: Emergency Medicine

## 2015-09-01 ENCOUNTER — Emergency Department (HOSPITAL_COMMUNITY): Payer: BC Managed Care – PPO

## 2015-09-01 ENCOUNTER — Encounter (HOSPITAL_COMMUNITY): Payer: Self-pay | Admitting: Emergency Medicine

## 2015-09-01 DIAGNOSIS — Z8739 Personal history of other diseases of the musculoskeletal system and connective tissue: Secondary | ICD-10-CM | POA: Insufficient documentation

## 2015-09-01 DIAGNOSIS — Z8719 Personal history of other diseases of the digestive system: Secondary | ICD-10-CM | POA: Diagnosis not present

## 2015-09-01 DIAGNOSIS — Z8669 Personal history of other diseases of the nervous system and sense organs: Secondary | ICD-10-CM | POA: Insufficient documentation

## 2015-09-01 DIAGNOSIS — E669 Obesity, unspecified: Secondary | ICD-10-CM | POA: Diagnosis not present

## 2015-09-01 DIAGNOSIS — I10 Essential (primary) hypertension: Secondary | ICD-10-CM | POA: Insufficient documentation

## 2015-09-01 DIAGNOSIS — Z8619 Personal history of other infectious and parasitic diseases: Secondary | ICD-10-CM | POA: Diagnosis not present

## 2015-09-01 DIAGNOSIS — Z8661 Personal history of infections of the central nervous system: Secondary | ICD-10-CM | POA: Diagnosis not present

## 2015-09-01 DIAGNOSIS — Z79899 Other long term (current) drug therapy: Secondary | ICD-10-CM | POA: Insufficient documentation

## 2015-09-01 DIAGNOSIS — R05 Cough: Secondary | ICD-10-CM | POA: Diagnosis not present

## 2015-09-01 DIAGNOSIS — R110 Nausea: Secondary | ICD-10-CM | POA: Insufficient documentation

## 2015-09-01 DIAGNOSIS — R1012 Left upper quadrant pain: Secondary | ICD-10-CM | POA: Insufficient documentation

## 2015-09-01 DIAGNOSIS — Z8632 Personal history of gestational diabetes: Secondary | ICD-10-CM | POA: Insufficient documentation

## 2015-09-01 DIAGNOSIS — Z3202 Encounter for pregnancy test, result negative: Secondary | ICD-10-CM | POA: Insufficient documentation

## 2015-09-01 DIAGNOSIS — R079 Chest pain, unspecified: Secondary | ICD-10-CM | POA: Diagnosis present

## 2015-09-01 DIAGNOSIS — J45909 Unspecified asthma, uncomplicated: Secondary | ICD-10-CM | POA: Diagnosis not present

## 2015-09-01 DIAGNOSIS — R0789 Other chest pain: Secondary | ICD-10-CM

## 2015-09-01 LAB — BASIC METABOLIC PANEL
ANION GAP: 9 (ref 5–15)
BUN: 21 mg/dL — AB (ref 6–20)
CALCIUM: 10 mg/dL (ref 8.9–10.3)
CO2: 28 mmol/L (ref 22–32)
CREATININE: 1.01 mg/dL — AB (ref 0.44–1.00)
Chloride: 104 mmol/L (ref 101–111)
GFR calc Af Amer: >60 mL/min (ref >60)
GFR calc non Af Amer: >60 mL/min (ref >60)
GLUCOSE: 112 mg/dL — AB (ref 65–99)
Potassium: 3.9 mmol/L (ref 3.5–5.1)
SODIUM: 141 mmol/L (ref 135–145)

## 2015-09-01 LAB — CBC
HCT: 40.9 % (ref 36.0–46.0)
Hemoglobin: 13.7 g/dL (ref 12.0–15.0)
MCH: 30.9 pg (ref 26.0–34.0)
MCHC: 33.5 g/dL (ref 30.0–36.0)
MCV: 92.1 fL (ref 78.0–100.0)
PLATELETS: 267 10*3/uL (ref 150–400)
RBC: 4.44 MIL/uL (ref 3.87–5.11)
RDW: 12.3 % (ref 11.5–15.5)
WBC: 6.3 10*3/uL (ref 4.0–10.5)

## 2015-09-01 LAB — I-STAT TROPONIN, ED: TROPONIN I, POC: 0.01 ng/mL (ref 0.00–0.08)

## 2015-09-01 NOTE — ED Provider Notes (Signed)
CSN: [id number]     Arrival date & time 09/01/15  2114 History  By signing my name below, I, Soijett Blue, attest that this documentation has been prepared under the direction and in the presence of Shrey Boike, MD. Electronically Signed: Soijett Blue, ED Scribe. 09/01/2015. 12:06 AM.   Chief Complaint  Patient presents with  . Chest Pain      Patient is a 55 y.o. female presenting with chest pain. The history is provided by the patient. No language interpreter was used.  Chest Pain Pain location:  Substernal area Pain quality: pressure   Pain radiates to:  R shoulder and neck Pain radiates to the back: no   Pain severity:  Moderate Onset quality:  Sudden Duration:  4 hours Timing:  Sporadic Progression:  Unchanged Chronicity:  New Context: eating   Relieved by:  Nothing Worsened by:  Certain positions Ineffective treatments:  Aspirin Associated symptoms: abdominal pain (on palpation), cough and nausea   Associated symptoms: not vomiting     HPI Comments: Sheila Nelson is a 55 y.o. female with a medical hx of asthma, HTN, and RAD, who presents to the Emergency Department complaining of sternal, pressure/squeezing, CP onset 8 PM tonight. She notes that she was laying down tonight when the CP began and she describes the CP as a squeezing sensation. She notes that she ate a small dinner prior to her symptoms. She reports that when she stood up the CP alleviated mildly and it came back stronger when she laid down again. She notes that the pain is now currently in her chest and that it raditates to her right shoulder and right sided neck. She states that she has tried an ASA with no relief of her symptoms. She denies any heavy lifting prior to the CP symptoms. She reports that she is able to ambulate approximately 25 minutes on flat land and 1 flight of stairs without becoming short of breath. She states that she is having associated symptoms of intermittent nausea, abdominal pain with  palpation, and cough. She denies abdominal pain, v/d, congestion, wheezing, fever, and any other symptoms. Denies recent travel or immobilization.     Past Medical History  Diagnosis Date  . Acute bronchitis 05/20/2010  . ADVANCED MATERNAL AGE 69/05[birth year]2010  . ALLERGIC RHINITIS, SEASONAL 10/09/2009  . ASTHMA UNSPECIFIED WITH EXACERBATION 05/20/2010  . HYPERTENSION 10/09/2009  . PLANTAR FASCIITIS, RIGHT 09/30/2010  . REACTIVE AIRWAY DISEASE 10/09/2009  . TINNITUS 10/09/2009  . Pancreatitis     Viral  . GERD (gastroesophageal reflux disease)   . Hx of viral meningitis   . Dysrhythmia     palpitations  . GDM (gestational diabetes mellitus)     glyburide  . Herpes   . H/O varicella   . Obese   . Gestational diabetes     glyburide  . Postpartum care following cesarean delivery 06/23/12) 06/24/2012   Past Surgical History  Procedure Laterality Date  . Appendectomy    . Right thigh surgery  1973    Trauma  . Inguinal hernia repair      Infant, bilateral  . Dilation and curettage of uterus    . Mouth surgery    . Cesarean section  06/23/2012    Procedure: CESAREAN SECTION;  Surgeon: Lovenia Kim, MD;  Location: [facility];  Service: Obstetrics;  Laterality: N/A;   Family History  Problem Relation Age of Onset  . Prostate cancer Father   . Stroke Father 39  . Heart failure Father   .  Hypertension Father   . Cancer Father     prostate  . Valvular heart disease Mother     MVR  . Thrombocytopenia Mother   . Birth defects Sister     tetralogy of falot  . Cancer Brother     prostate  . Stroke Maternal Grandmother    Social History  Substance Use Topics  . Smoking status: Never Smoker   . Smokeless tobacco: Never Used     Comment: Lives with spouse s/p wedding 07/2010. Employed as RN-pediatric critical care; now [facility]  . Alcohol Use: Yes     Comment: wine with dinner every 2 weeks   OB History    Gravida Para Term Preterm AB TAB SAB Ectopic Multiple Living   2 1 1  0 1 1 0 0 0 1      Review of Systems  Respiratory: Positive for cough.   Cardiovascular: Positive for chest pain.  Gastrointestinal: Positive for nausea and abdominal pain (on palpation). Negative for vomiting.  All other systems reviewed and are negative.     Allergies  Review of patient's allergies indicates no known allergies.  Home Medications   Prior to Admission medications   Medication Sig Start Date End Date Taking? Authorizing Provider  labetalol (NORMODYNE) 100 MG tablet Take 100 mg by mouth 2 (two) times daily.   Yes Historical Provider, MD  Multiple Vitamin (MULTIVITAMIN WITH MINERALS) TABS tablet Take 1 tablet by mouth daily.   Yes Historical Provider, MD  Omega 3 1000 MG CAPS Take 1,000 mg by mouth daily.   Yes Historical Provider, MD  TURMERIC PO Take 1 tablet by mouth daily.   Yes Historical Provider, MD  valACYclovir (VALTREX) 1000 MG tablet Take 1,000 mg by mouth 3 (three) times daily.   Yes Historical Provider, MD  hydrochlorothiazide (MICROZIDE) 12.5 MG capsule Take 1 capsule (12.5 mg total) by mouth daily. Patient not taking: Reported on 09/02/2015 06/25/12   Artelia Laroche, CNM  ibuprofen (ADVIL,MOTRIN) 600 MG tablet Take 1 tablet (600 mg total) by mouth every 6 (six) hours. Patient not taking: Reported on 09/02/2015 06/25/12   Artelia Laroche, CNM  oxyCODONE-acetaminophen (PERCOCET/ROXICET) 5-325 MG per tablet Take 1 tablet by mouth every 4 (four) hours as needed (moderate - severe pain). Patient not taking: Reported on 09/02/2015 06/25/12   Artelia Laroche, CNM   BP 124/75 mmHg  Pulse 72  Temp(Src) 97.7 F (36.5 C) (Oral)  Resp 19  Ht 5\' 4"  (1.626 m)  Wt 210 lb (95.255 kg)  BMI 36.03 kg/m2  SpO2 96% Physical Exam  Constitutional: She is oriented to person, place, and time. She appears well-developed and well-nourished. No distress.  HENT:  Head: Normocephalic and atraumatic.  Mouth/Throat: Oropharynx is clear and moist. No oropharyngeal exudate.  Eyes: EOM are normal. Pupils are  equal, round, and reactive to light.  Neck: Neck supple.  Cardiovascular: Normal rate, regular rhythm and normal heart sounds.  Exam reveals no gallop and no friction rub.   No murmur heard. Pulmonary/Chest: Effort normal and breath sounds normal. No respiratory distress. She has no wheezes. She has no rales. She exhibits tenderness.  Abdominal: Soft. Bowel sounds are increased. There is tenderness in the left upper quadrant.  Musculoskeletal: Normal range of motion.  Neurological: She is alert and oriented to person, place, and time.  Skin: Skin is warm and dry.  Psychiatric: She has a normal mood and affect. Her behavior is normal.  Nursing note and vitals reviewed.   ED Course  Procedures (including critical care time) DIAGNOSTIC STUDIES: Oxygen Saturation is 100% on RA, nl by my interpretation.    COORDINATION OF CARE: 12:06 AM Discussed treatment plan with pt at bedside which includes labs, EKG, CXR, and pt agreed to plan.   2:23 AM-   Labs Review Labs Reviewed  BASIC METABOLIC PANEL - Abnormal; Notable for the following:    Glucose, Bld 112 (*)    BUN 21 (*)    Creatinine, Ser 1.01 (*)    All other components within normal limits  HEPATIC FUNCTION PANEL - Abnormal; Notable for the following:    Bilirubin, Direct <0.1 (*)    All other components within normal limits  URINALYSIS, ROUTINE W REFLEX MICROSCOPIC (NOT AT [facility]) - Abnormal; Notable for the following:    APPearance CLOUDY (*)    Leukocytes, UA SMALL (*)    All other components within normal limits  URINE MICROSCOPIC-ADD ON - Abnormal; Notable for the following:    Squamous Epithelial / LPF 0-5 (*)    Bacteria, UA FEW (*)    All other components within normal limits  I-STAT CHEM 8, ED - Abnormal; Notable for the following:    BUN 22 (*)    All other components within normal limits  CBC  LIPASE, BLOOD  I-STAT TROPOININ, ED  I-STAT BETA HCG BLOOD, ED ([place], [facility], [facility] ONLY)  I-STAT TROPOININ, ED  I-STAT  TROPOININ, ED    Imaging Review Dg Chest 2 View  09/01/2015  CLINICAL DATA:  Acute onset of left-sided chest pain and high blood pressure. Initial encounter. EXAM: CHEST  2 VIEW COMPARISON:  None. FINDINGS: The lungs are well-aerated. Minimal left basilar atelectasis is noted. There is no evidence of pleural effusion or pneumothorax. The heart is normal in size; the mediastinal contour is within normal limits. No acute osseous abnormalities are seen. IMPRESSION: Minimal left basilar atelectasis noted.  Lungs otherwise clear Electronically Signed   By: Garald Balding M.D.   On: 09/01/2015 22:54   I have personally reviewed and evaluated these images and lab results as part of my medical decision-making.   EKG Interpretation   Date/Time:  Sunday September 01 2015 21:32:34 EST Ventricular Rate:  64 PR Interval:  155 QRS Duration: 97 QT Interval:  416 QTC Calculation: 429 R Axis:   51 Text Interpretation:  Sinus rhythm Confirmed by [facility]  MD, Jose Alleyne  ([id number]) on 09/01/2015 11:07:02 PM      MDM   Final diagnoses:  None   Ruled out for MI.    Pain free post toradol in the ED  I suspect this is actually MSK pain as patient said ouch when stethoscope touched her chest.  No signs of biliary disease on exam or labs.  Suspect secondary component of GERd post pot roast and lima beans.  GERD friendly diet.  Follow up with your PMD.  Strict return precautions given.    I personally performed the services described in this documentation, which was scribed in my presence. The recorded information has been reviewed and is accurate.      Veatrice Kells, MD 09/02/15 [phone]

## 2015-09-01 NOTE — ED Notes (Signed)
Pt states she was laying down tonight with her child and started having chest pain  Pt states it felt like someone reached in her chest and grabbed her and was squeezing  Pt states it eased up some when she stood up but when she laid back down it came back stronger than before  Pt states she got up and took an aspirin  Pt states the pain is in her chest and now and then she gets shooting pain up in her right shoulder and neck  Pt states when it first started she could feel some irregular beats of her heart

## 2015-09-02 ENCOUNTER — Encounter (HOSPITAL_COMMUNITY): Payer: Self-pay | Admitting: Emergency Medicine

## 2015-09-02 LAB — I-STAT CHEM 8, ED
BUN: 22 mg/dL — ABNORMAL HIGH (ref 6–20)
CALCIUM ION: 1.17 mmol/L (ref 1.12–1.23)
CHLORIDE: 102 mmol/L (ref 101–111)
Creatinine, Ser: 0.9 mg/dL (ref 0.44–1.00)
GLUCOSE: 99 mg/dL (ref 65–99)
HCT: 40 % (ref 36.0–46.0)
Hemoglobin: 13.6 g/dL (ref 12.0–15.0)
Potassium: 3.7 mmol/L (ref 3.5–5.1)
SODIUM: 139 mmol/L (ref 135–145)
TCO2: 26 mmol/L (ref 0–100)

## 2015-09-02 LAB — URINALYSIS, ROUTINE W REFLEX MICROSCOPIC
BILIRUBIN URINE: NEGATIVE
Glucose, UA: NEGATIVE mg/dL
Hgb urine dipstick: NEGATIVE
Ketones, ur: NEGATIVE mg/dL
NITRITE: NEGATIVE
PH: 6 (ref 5.0–8.0)
Protein, ur: NEGATIVE mg/dL
SPECIFIC GRAVITY, URINE: 1.017 (ref 1.005–1.030)

## 2015-09-02 LAB — HEPATIC FUNCTION PANEL
ALBUMIN: 4.3 g/dL (ref 3.5–5.0)
ALK PHOS: 82 U/L (ref 38–126)
ALT: 22 U/L (ref 14–54)
AST: 20 U/L (ref 15–41)
Bilirubin, Direct: <0.1 mg/dL — ABNORMAL LOW (ref 0.1–0.5)
TOTAL PROTEIN: 7.2 g/dL (ref 6.5–8.1)
Total Bilirubin: 0.7 mg/dL (ref 0.3–1.2)

## 2015-09-02 LAB — LIPASE, BLOOD: LIPASE: 49 U/L (ref 11–51)

## 2015-09-02 LAB — I-STAT TROPONIN, ED: Troponin i, poc: 0 ng/mL (ref 0.00–0.08)

## 2015-09-02 LAB — URINE MICROSCOPIC-ADD ON

## 2015-09-02 LAB — I-STAT BETA HCG BLOOD, ED (MC, WL, AP ONLY)

## 2015-09-02 MED ORDER — DICYCLOMINE HCL 10 MG/ML IM SOLN
20.0000 mg | Freq: Once | INTRAMUSCULAR | Status: AC
  Administered 2015-09-02: 20 mg via INTRAMUSCULAR
  Filled 2015-09-02: qty 2

## 2015-09-02 MED ORDER — SODIUM CHLORIDE 0.9 % IV BOLUS (SEPSIS)
500.0000 mL | Freq: Once | INTRAVENOUS | Status: AC
  Administered 2015-09-02: 500 mL via INTRAVENOUS

## 2015-09-02 MED ORDER — OMEPRAZOLE 20 MG PO CPDR: 20.0000 mg | DELAYED_RELEASE_CAPSULE | Freq: Every day | ORAL | Status: DC

## 2015-09-02 MED ORDER — ONDANSETRON HCL 4 MG/2ML IJ SOLN
4.0000 mg | Freq: Once | INTRAMUSCULAR | Status: AC
  Administered 2015-09-02: 4 mg via INTRAVENOUS
  Filled 2015-09-02: qty 2

## 2015-09-02 MED ORDER — KETOROLAC TROMETHAMINE 30 MG/ML IJ SOLN
30.0000 mg | Freq: Once | INTRAMUSCULAR | Status: AC
  Administered 2015-09-02: 30 mg via INTRAVENOUS
  Filled 2015-09-02: qty 1

## 2015-09-02 MED ORDER — GI COCKTAIL ~~LOC~~
30.0000 mL | Freq: Once | ORAL | Status: AC
  Administered 2015-09-02: 30 mL via ORAL
  Filled 2015-09-02: qty 30

## 2015-09-02 NOTE — Discharge Instructions (Signed)
Cardiac-Specific Troponin I and T Test °WHY AM I HAVING THIS TEST? °You may have this test if you have experienced chest pain. The test can be used to determine if you have had a heart attack or injury to heart (cardiac) muscle. This test can also help predict the possibility of future heart attacks. °This test measures the concentration of cardiac-specific troponin in your blood. Troponins are proteins that help muscles contract. There are three forms of troponin, including troponins C, I, and T. The types of troponins I and T that are found in cardiac muscle are different from the troponins I and T that are found in skeletal muscle. Therefore, testing can be done for cardiac-specific troponins I and T. These types of troponin are normally present in very small quantities in the blood. When there is damage to heart muscle cells, cardiac troponins I and T are released into circulation. The more damage there is, the greater the concentration of troponins I and T. When a person has a heart attack, levels of troponin can become elevated in the blood within 3-4 hours after injury and may remain elevated for 10-14 days. °WHAT KIND OF SAMPLE IS TAKEN? °A blood sample is required for this test. It is usually collected by inserting a needle into a vein. Usually, an initial blood sample is collected, and then another blood sample is collected 12 hours later. After these samples, you will have your blood tested daily for 3-5 days. You might also have it tested weekly for 5-6 weeks. °HOW DO I PREPARE FOR THE TEST? °There is no preparation required for this test. However, be aware that you will need to make arrangements to have your blood collected frequently.  °WHAT ARE THE REFERENCE RANGES? °Reference values are considered healthy values established after testing a large group of healthy people. Reference values may vary among different people, labs, and hospitals. It is your responsibility to obtain your test results. Ask  the lab or department performing the test when and how you will get your results. °Reference values for cardiac troponins are as follows: °· Cardiac troponin T: less than 0.1 ng/mL. °· Cardiac troponin I: less than 0.03 ng/mL. °WHAT DO THE RESULTS MEAN? °Troponin values above the reference values may indicate: °· Injury to the heart muscle. °· Heart attack. °Talk with your health care provider to discuss your results, treatment options, and if necessary, the need for more tests. Talk with your health care provider if you have any questions about your results. °  °This information is not intended to replace advice given to you by your health care provider. Make sure you discuss any questions you have with your health care provider. °  °Document Released: 09/19/2004 Document Revised: 09/07/2014 Document Reviewed: 01/10/2014 °Elsevier Interactive Patient Education ©2016 Elsevier Inc. ° °

## 2015-09-13 ENCOUNTER — Encounter: Payer: Self-pay | Admitting: Cardiology

## 2015-09-13 ENCOUNTER — Ambulatory Visit (INDEPENDENT_AMBULATORY_CARE_PROVIDER_SITE_OTHER): Payer: BC Managed Care – PPO | Admitting: Internal Medicine

## 2015-09-13 VITALS — BP 120/80 | HR 70 | Ht 64.0 in | Wt 214.8 lb

## 2015-09-13 DIAGNOSIS — R079 Chest pain, unspecified: Secondary | ICD-10-CM

## 2015-09-13 NOTE — Progress Notes (Signed)
09/13/2015 Sheila Nelson   [birth year]  [id number]  Primary Physician Serita Grammes, CNM Primary Cardiologist: New (Dr. Harrington Challenger)  Referring MD: Dr. Serita Grammes  Reason for Visit/CC: PCP referral for exertional chest pain.   HPI:  The patient is a 55 y/o female, followed medically by Dr. Serita Grammes, who was referred to our practice for evaluation of exertional chest discomfort. Her PMH is significant for HTN and obesity. Family history is notable for sudden death of her sister, at age 19, which was presumed to be from SCD. Her mother had MVP, requiring MV repair. The patient denies any personal history of DM, HLD or tobacco use. She reports that in 2012, she had a negative stress test and normal echocardiogram. She also has a 62 year old son. She used in vitro fertilization.   She notes the occurrence of exertional chest discomfort off and on for the last several months. The discomfort originates in the substernal area and radiates to the right side of her neck. It's exertional. She feels it while chasing her son around the house. She also feels winded when this occurs. Symptoms are relieved with rest but can also occur at rest. She notes nocturnal chest pressure while sleeping. It dose not feel like reflux.   On 09/01/2015 she presented to the [facility] ER with similar complaints. EKG and cardiac enzymes were negative. Lipase also negative. She was released from the ED and given a Rx for a PPI. She denies any improvement since starting Prilosec. She was instructed by the ED to f/u with Dr. Brigitte Pulse, and subsequently referred to our office.   Today in clinic, she is currently asymptomatic. Physical exam is benign. EKG shows NSR with nonspecific T wave abnormalities. HR is 73 bpm. BP is well controlled at 120/80.     Current Outpatient Prescriptions  Medication Sig Dispense Refill  . labetalol (NORMODYNE) 100 MG tablet Take 100 mg by mouth 2 (two) times daily.    . Multiple Vitamin (MULTIVITAMIN WITH  MINERALS) TABS tablet Take 1 tablet by mouth daily.    . Omega 3 1000 MG CAPS Take 1,000 mg by mouth daily.    Marland Kitchen omeprazole (PRILOSEC) 20 MG capsule Take 1 capsule (20 mg total) by mouth daily. 30 capsule 0  . sertraline (ZOLOFT) 50 MG tablet Take 50 mg by mouth daily.    . TURMERIC PO Take 1 tablet by mouth daily.    . valACYclovir (VALTREX) 1000 MG tablet Take 1,000 mg by mouth 3 (three) times daily as needed. Cold sores or fever blisters    . [DISCONTINUED] beclomethasone (QVAR) 80 MCG/ACT inhaler Inhale 2 puffs into the lungs 2 (two) times daily. 1 Inhaler 0   No current facility-administered medications for this visit.    No Known Allergies  Social History   Social History  . Marital Status: Single    Spouse Name: N/A  . Number of Children: 0  . Years of Education: N/A   Occupational History  .  [place]   Social History Main Topics  . Smoking status: Never Smoker   . Smokeless tobacco: Never Used     Comment: Lives with spouse s/p wedding 07/2010. Employed as RN-pediatric critical care; now [employer]  . Alcohol Use: Yes     Comment: wine with dinner every 2 weeks  . Drug Use: No  . Sexual Activity: Yes   Other Topics Concern  . 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       Review of Systems: General: negative for chills, fever, night sweats or weight changes.  Cardiovascular: negative for chest pain, dyspnea on exertion, edema, orthopnea, palpitations, paroxysmal nocturnal dyspnea or shortness of breath Dermatological: negative for rash Respiratory: negative for cough or wheezing Urologic: negative for hematuria Abdominal: negative for nausea, vomiting, diarrhea, bright red blood per rectum, melena, or hematemesis Neurologic: negative for visual changes, syncope, or dizziness All other systems reviewed and are otherwise negative except as noted above.    Blood pressure 120/80, pulse 70, height 5\' 4"  (1.626 m), weight 214 lb 12.8  oz (97.433 kg), unknown if currently breastfeeding.  General appearance: alert, cooperative, no distress and moderately obese Neck: no carotid bruit and no JVD Lungs: clear to auscultation bilaterally Heart: regular rate and rhythm, S1, S2 normal, no murmur, click, rub or gallop Extremities: no LEE Pulses: 2+ and symmetric Skin: warm and dry Neurologic: Grossly normal  EKG NSR with nonspecific T wave abnormalities   ASSESSMENT AND PLAN:   1. Exertional Chest Pain: Her symptoms are a bit concerning for possible cardiac etiology. Given her risk factors of HTN and family h/o SCD in a first degree relative, we will further risk stratify with an exercise NST to assess for ischemia. Continue risk factor modification with control of BP with antihypertensives. We will obtain recent lipid profile from Dr. Raul Del office to assess for DLD. Also recommend daily lose dose ASA for primary prevention.   2. HTN: BP is well controlled at 120/80 today. On labetalol. Followed and managed by PCP.   PLAN  We will arrange exercise nuclear stress test and f/u next week.   The patient was also seen and examined by Dr. Harrington Challenger, DOD, who agrees with the above assessment and plan.   Lyda Jester PA-C 09/13/2015 9:13 AM    Pt seen and examined  Lungs are clear  Cardiac exam:  RRR  Ext No edemea With symptoms and risk I would set up for stress test ti see if symptoms reproducible and to evaluate exercise tolerance BP controlled.  Continue meds    Dorris Carnes

## 2015-09-13 NOTE — Patient Instructions (Addendum)
Medication Instructions:   CONTINUE  SAME MEDICATIONS  If you need a refill on your cardiac medications before your next appointment, please call your pharmacy.  Labwork: NONE ORDER TODAY    Testing/Procedures: Your physician has requested that you have en exercise stress myoview. For further information please visit [email or web address]. Please follow instruction sheet, as given.    Follow-Up:  BASED UPON RESULTS OF STRESS TEST   Any Other Special Instructions Will Be Listed Below (If Applicable).

## 2015-09-16 ENCOUNTER — Telehealth (HOSPITAL_COMMUNITY): Payer: Self-pay

## 2015-09-16 NOTE — Telephone Encounter (Signed)
Encounter complete. 

## 2015-09-17 ENCOUNTER — Encounter (HOSPITAL_COMMUNITY)
Admission: EM | Disposition: A | Discharge status: Home / Self Care | Payer: Self-pay | Source: Home / Self Care | Attending: Emergency Medicine

## 2015-09-17 ENCOUNTER — Other Ambulatory Visit: Payer: Self-pay | Admitting: Physician Assistant

## 2015-09-17 ENCOUNTER — Encounter (HOSPITAL_COMMUNITY): Payer: Self-pay | Admitting: Adult Health

## 2015-09-17 ENCOUNTER — Observation Stay (HOSPITAL_COMMUNITY)
Admission: EM | Admit: 2015-09-17 | Discharge: 2015-09-17 | Disposition: A | Discharge status: Home / Self Care | Payer: BC Managed Care – PPO | Attending: Internal Medicine | Admitting: Internal Medicine

## 2015-09-17 ENCOUNTER — Encounter: Payer: Self-pay | Admitting: Internal Medicine

## 2015-09-17 ENCOUNTER — Emergency Department (HOSPITAL_COMMUNITY): Payer: BC Managed Care – PPO

## 2015-09-17 DIAGNOSIS — B009 Herpesviral infection, unspecified: Secondary | ICD-10-CM | POA: Insufficient documentation

## 2015-09-17 DIAGNOSIS — J45909 Unspecified asthma, uncomplicated: Secondary | ICD-10-CM | POA: Insufficient documentation

## 2015-09-17 DIAGNOSIS — Z823 Family history of stroke: Secondary | ICD-10-CM | POA: Insufficient documentation

## 2015-09-17 DIAGNOSIS — Z6836 Body mass index (BMI) 36.0-36.9, adult: Secondary | ICD-10-CM | POA: Insufficient documentation

## 2015-09-17 DIAGNOSIS — Z8241 Family history of sudden cardiac death: Secondary | ICD-10-CM | POA: Diagnosis not present

## 2015-09-17 DIAGNOSIS — K859 Acute pancreatitis without necrosis or infection, unspecified: Secondary | ICD-10-CM | POA: Insufficient documentation

## 2015-09-17 DIAGNOSIS — Z7982 Long term (current) use of aspirin: Secondary | ICD-10-CM | POA: Diagnosis not present

## 2015-09-17 DIAGNOSIS — R072 Precordial pain: Principal | ICD-10-CM | POA: Insufficient documentation

## 2015-09-17 DIAGNOSIS — H9319 Tinnitus, unspecified ear: Secondary | ICD-10-CM | POA: Diagnosis not present

## 2015-09-17 DIAGNOSIS — R079 Chest pain, unspecified: Secondary | ICD-10-CM | POA: Diagnosis not present

## 2015-09-17 DIAGNOSIS — I10 Essential (primary) hypertension: Secondary | ICD-10-CM | POA: Insufficient documentation

## 2015-09-17 DIAGNOSIS — K219 Gastro-esophageal reflux disease without esophagitis: Secondary | ICD-10-CM | POA: Diagnosis present

## 2015-09-17 DIAGNOSIS — I493 Ventricular premature depolarization: Secondary | ICD-10-CM

## 2015-09-17 DIAGNOSIS — E669 Obesity, unspecified: Secondary | ICD-10-CM | POA: Diagnosis present

## 2015-09-17 DIAGNOSIS — M722 Plantar fascial fibromatosis: Secondary | ICD-10-CM | POA: Insufficient documentation

## 2015-09-17 DIAGNOSIS — Z8249 Family history of ischemic heart disease and other diseases of the circulatory system: Secondary | ICD-10-CM | POA: Insufficient documentation

## 2015-09-17 HISTORY — DX: Chest pain, unspecified: R07.9

## 2015-09-17 HISTORY — PX: CARDIAC CATHETERIZATION: SHX172

## 2015-09-17 HISTORY — DX: Obesity, unspecified: E66.9

## 2015-09-17 HISTORY — DX: Ventricular premature depolarization: I49.3

## 2015-09-17 HISTORY — DX: Essential (primary) hypertension: I10

## 2015-09-17 LAB — PROTIME-INR
INR: 1.09 (ref 0.00–1.49)
PROTHROMBIN TIME: 14.3 s (ref 11.6–15.2)

## 2015-09-17 LAB — I-STAT TROPONIN, ED: TROPONIN I, POC: 0.01 ng/mL (ref 0.00–0.08)

## 2015-09-17 LAB — BASIC METABOLIC PANEL
Anion gap: 9 (ref 5–15)
BUN: 16 mg/dL (ref 6–20)
CALCIUM: 9.9 mg/dL (ref 8.9–10.3)
CO2: 26 mmol/L (ref 22–32)
CREATININE: 0.91 mg/dL (ref 0.44–1.00)
Chloride: 102 mmol/L (ref 101–111)
GFR calc Af Amer: >60 mL/min (ref >60)
GLUCOSE: 137 mg/dL — AB (ref 65–99)
Potassium: 4.1 mmol/L (ref 3.5–5.1)
Sodium: 137 mmol/L (ref 135–145)

## 2015-09-17 LAB — CBC
HCT: 40.1 % (ref 36.0–46.0)
HEMOGLOBIN: 13.6 g/dL (ref 12.0–15.0)
MCH: 30.6 pg (ref 26.0–34.0)
MCHC: 33.9 g/dL (ref 30.0–36.0)
MCV: 90.1 fL (ref 78.0–100.0)
PLATELETS: 226 10*3/uL (ref 150–400)
RBC: 4.45 MIL/uL (ref 3.87–5.11)
RDW: 12.3 % (ref 11.5–15.5)
WBC: 5.2 10*3/uL (ref 4.0–10.5)

## 2015-09-17 LAB — POC URINE PREG, ED: Preg Test, Ur: NEGATIVE

## 2015-09-17 SURGERY — LEFT HEART CATH AND CORONARY ANGIOGRAPHY

## 2015-09-17 MED ORDER — NITROGLYCERIN 0.4 MG SL SUBL: 0.4000 mg | SUBLINGUAL_TABLET | SUBLINGUAL | Status: DC | PRN

## 2015-09-17 MED ORDER — MIDAZOLAM HCL 2 MG/2ML IJ SOLN
INTRAMUSCULAR | Status: AC
  Filled 2015-09-17: qty 2

## 2015-09-17 MED ORDER — SODIUM CHLORIDE 0.9 % IV SOLN: 250.0000 mL | INTRAVENOUS | Status: DC | PRN

## 2015-09-17 MED ORDER — ONDANSETRON HCL 4 MG/2ML IJ SOLN
4.0000 mg | Freq: Once | INTRAMUSCULAR | Status: AC
  Administered 2015-09-17: 4 mg via INTRAVENOUS
  Filled 2015-09-17: qty 2

## 2015-09-17 MED ORDER — SODIUM CHLORIDE 0.9 % IJ SOLN: 3.0000 mL | INTRAMUSCULAR | Status: DC | PRN

## 2015-09-17 MED ORDER — PANTOPRAZOLE SODIUM 40 MG PO TBEC: 40.0000 mg | DELAYED_RELEASE_TABLET | Freq: Every day | ORAL | Status: DC

## 2015-09-17 MED ORDER — SODIUM CHLORIDE 0.9 % WEIGHT BASED INFUSION
3.0000 mL/kg/h | INTRAVENOUS | Status: DC
  Administered 2015-09-17: 3 mL/kg/h via INTRAVENOUS

## 2015-09-17 MED ORDER — GI COCKTAIL ~~LOC~~
30.0000 mL | Freq: Once | ORAL | Status: AC
  Administered 2015-09-17: 30 mL via ORAL
  Filled 2015-09-17: qty 30

## 2015-09-17 MED ORDER — LIDOCAINE HCL (PF) 1 % IJ SOLN
INTRAMUSCULAR | Status: AC
  Filled 2015-09-17: qty 30

## 2015-09-17 MED ORDER — ASPIRIN 81 MG PO CHEW: 324.0000 mg | CHEWABLE_TABLET | ORAL | Status: DC

## 2015-09-17 MED ORDER — ONDANSETRON HCL 4 MG/2ML IJ SOLN
4.0000 mg | Freq: Four times a day (QID) | INTRAMUSCULAR | Status: DC | PRN
  Administered 2015-09-17: 4 mg via INTRAVENOUS
  Filled 2015-09-17: qty 2

## 2015-09-17 MED ORDER — ASPIRIN 81 MG PO CHEW: 81.0000 mg | CHEWABLE_TABLET | ORAL | Status: DC

## 2015-09-17 MED ORDER — ASPIRIN EC 81 MG PO TBEC: 81.0000 mg | DELAYED_RELEASE_TABLET | Freq: Every day | ORAL | Status: DC

## 2015-09-17 MED ORDER — VERAPAMIL HCL 2.5 MG/ML IV SOLN
INTRAVENOUS | Status: DC | PRN
  Administered 2015-09-17: 10 mL via INTRA_ARTERIAL

## 2015-09-17 MED ORDER — FENTANYL CITRATE (PF) 100 MCG/2ML IJ SOLN
INTRAMUSCULAR | Status: DC | PRN
  Administered 2015-09-17: 25 ug via INTRAVENOUS
  Administered 2015-09-17: 50 ug via INTRAVENOUS

## 2015-09-17 MED ORDER — MORPHINE SULFATE (PF) 4 MG/ML IV SOLN
4.0000 mg | Freq: Once | INTRAVENOUS | Status: AC
  Administered 2015-09-17: 4 mg via INTRAVENOUS
  Filled 2015-09-17: qty 1

## 2015-09-17 MED ORDER — IOHEXOL 350 MG/ML SOLN
INTRAVENOUS | Status: DC | PRN
  Administered 2015-09-17: 80 mL via INTRAVENOUS

## 2015-09-17 MED ORDER — SODIUM CHLORIDE 0.9 % IV SOLN: INTRAVENOUS | Status: DC

## 2015-09-17 MED ORDER — LIDOCAINE HCL (PF) 1 % IJ SOLN
INTRAMUSCULAR | Status: DC | PRN
  Administered 2015-09-17: 16:00:00

## 2015-09-17 MED ORDER — HEPARIN SODIUM (PORCINE) 1000 UNIT/ML IJ SOLN
INTRAMUSCULAR | Status: AC
  Filled 2015-09-17: qty 1

## 2015-09-17 MED ORDER — ASPIRIN 300 MG RE SUPP: 300.0000 mg | RECTAL | Status: DC

## 2015-09-17 MED ORDER — SODIUM CHLORIDE 0.9 % IJ SOLN: 3.0000 mL | Freq: Two times a day (BID) | INTRAMUSCULAR | Status: DC

## 2015-09-17 MED ORDER — SERTRALINE HCL 25 MG PO TABS: 25.0000 mg | ORAL_TABLET | Freq: Every day | ORAL | Status: DC

## 2015-09-17 MED ORDER — ACETAMINOPHEN 325 MG PO TABS: 650.0000 mg | ORAL_TABLET | ORAL | Status: DC | PRN

## 2015-09-17 MED ORDER — VERAPAMIL HCL 2.5 MG/ML IV SOLN
INTRAVENOUS | Status: AC
  Filled 2015-09-17: qty 2

## 2015-09-17 MED ORDER — ASPIRIN 81 MG PO CHEW
162.0000 mg | CHEWABLE_TABLET | Freq: Once | ORAL | Status: AC
  Administered 2015-09-17: 162 mg via ORAL
  Filled 2015-09-17: qty 2

## 2015-09-17 MED ORDER — LABETALOL HCL 100 MG PO TABS: 100.0000 mg | ORAL_TABLET | Freq: Every day | ORAL | Status: DC

## 2015-09-17 MED ORDER — SODIUM CHLORIDE 0.9 % WEIGHT BASED INFUSION
1.0000 mL/kg/h | INTRAVENOUS | Status: DC
  Administered 2015-09-17: 1 mL/kg/h via INTRAVENOUS

## 2015-09-17 MED ORDER — HEPARIN (PORCINE) IN NACL 2-0.9 UNIT/ML-% IJ SOLN
INTRAMUSCULAR | Status: AC
  Filled 2015-09-17: qty 1500

## 2015-09-17 MED ORDER — MIDAZOLAM HCL 2 MG/2ML IJ SOLN
INTRAMUSCULAR | Status: DC | PRN
  Administered 2015-09-17: 2 mg via INTRAVENOUS
  Administered 2015-09-17: 1 mg via INTRAVENOUS

## 2015-09-17 MED ORDER — FENTANYL CITRATE (PF) 100 MCG/2ML IJ SOLN
INTRAMUSCULAR | Status: AC
  Filled 2015-09-17: qty 2

## 2015-09-17 MED ORDER — HEPARIN SODIUM (PORCINE) 1000 UNIT/ML IJ SOLN
INTRAMUSCULAR | Status: DC | PRN
  Administered 2015-09-17: 5500 [IU] via INTRAVENOUS

## 2015-09-17 SURGICAL SUPPLY — 12 items

## 2015-09-17 NOTE — Discharge Summary (Signed)
Discharge Summary   Patient ID: Michaya Khalsa MRN: [id number], DOB/AGE: [birth year] 55 y.o. Admit date: 09/17/2015 D/C date:     09/17/2015  Primary Cardiologist: Dr Harrington Challenger  Principal Problem:   Chest pain Active Problems:   GERD (gastroesophageal reflux disease)   Obesity   PVC's (premature ventricular contractions)   HTN (hypertension)    Admission Dates:09/17/15-09/17/15 Discharge Diagnosis: non cardiac chest pain s/p LHC with normal coronaries.   HPI: Nafisah Marenco is a 55 y.o. female with a history of HTN, obesity and family hx of SCD who presented to [facility] ED today with recurrent chest pain.   Family history is notable for sudden death of her sister, at age 63, which was presumed to be from SCD this past summer. Her mother had MVP, requiring MV repair. The patient denies any personal history of DM, HLD or tobacco use. She reports that in 2012, she had a negative stress test and normal echocardiogram. She also has a 55 year old son after using IVF at the age of 28 to get pregnant.  On 09/01/2015 she presented to the [facility] ER with chest pain. EKG and cardiac enzymes were negative. Lipase also negative. She was released from the ED and given a Rx for a PPI. She did not have any improvement after starting Prilosec. She was instructed by the ED to f/u with Dr. Brigitte Pulse, and subsequently referred to our office.   She was seen last week as a new patient in our office by Dr. Harrington Challenger and Lyda Jester PA-C for evaluation of exertional chest pain. She was set up for exercise myoview which was scheduled for 09/08/15. However, she woke up the morning before (09/07/15) with sternal and epigastric chest pain that woke her from sleep and was associated with diaphoresis, nausea, vomiting and dizziness.   She was was admitted to [facility] and undwerwent LHC that same day (09/17/15) which revealed no angiographic evidence of CAD. She had couplets of PVCs while in the cath lab that reproduced her chest pain. For this  reason it was decided to send her home with an outpatient cardiac monitor.    Hospital Course  Chest pain: troponin x1. ECG with no ST or TW changes. LHC with no CAD. Chest pain felt to be non cardiac or from PVCs. Will plan to get her set up for a 30 day outpatient cardiac monitor to see if these are the cause of her chest pain.   PVCs: as above will have her set up for a monitor. Continue BB   HTN: Continue current regimen.   The patient has had an uncomplicated hospital course and is recovering well. The radial catheter site is stable. She has been seen by Dr. Harrington Challenger today and deemed ready for discharge home. A staff message has been sent for all follow-up appointments to bo scheduled. Discharge medications are listed below.   Discharge Vitals: Blood pressure 122/58, pulse 57, temperature 98 F (36.7 C), temperature source Oral, resp. rate 0, height 5\' 4"  (1.626 m), weight 214 lb 12.8 oz (97.433 kg), SpO2 95 %, unknown if currently breastfeeding.  Labs: Lab Results  Component Value Date   WBC 5.2 09/17/2015   HGB 13.6 09/17/2015   HCT 40.1 09/17/2015   MCV 90.1 09/17/2015   PLT 226 09/17/2015     Recent Labs Lab 09/17/15 0645  NA 137  K 4.1  CL 102  CO2 26  BUN 16  CREATININE 0.91  CALCIUM 9.9  GLUCOSE 137*  Diagnostic Studies/Procedures   Dg Chest 2 View  09/01/2015  CLINICAL DATA:  Acute onset of left-sided chest pain and high blood pressure. Initial encounter. EXAM: CHEST  2 VIEW COMPARISON:  None. FINDINGS: The lungs are well-aerated. Minimal left basilar atelectasis is noted. There is no evidence of pleural effusion or pneumothorax. The heart is normal in size; the mediastinal contour is within normal limits. No acute osseous abnormalities are seen. IMPRESSION: Minimal left basilar atelectasis noted.  Lungs otherwise clear Electronically Signed   By: Garald Balding M.D.   On: 09/01/2015 22:54   Dg Chest Portable 1 View  09/17/2015  CLINICAL DATA:  Chest pain.  EXAM: PORTABLE CHEST 1 VIEW COMPARISON:  September 01, 2015. FINDINGS: The heart size and mediastinal contours are within normal limits. Both lungs are clear. No pneumothorax or pleural effusion is noted. The visualized skeletal structures are unremarkable. IMPRESSION: No acute cardiopulmonary abnormality seen. Electronically Signed   By: Marijo Conception, M.D.   On: 09/17/2015 09:45     Procedures    Left Heart Cath and Coronary Angiography    Conclusion    1. No angiographic evidence of CAD.  2. Normal LV systolic function.  3. Non-cardiac chest pain  Recommendations: Chest pain occurred during the cath while she was having PVCs. No further ischemic workup. May consider event monitor.      Discharge Medications     Medication List    STOP taking these medications        aspirin 81 MG tablet      TAKE these medications        ibuprofen 600 MG tablet  Commonly known as:  ADVIL,MOTRIN  Take 600 mg by mouth every 6 (six) hours as needed for fever.     labetalol 100 MG tablet  Commonly known as:  NORMODYNE  Take 100 mg by mouth daily.     multivitamin with minerals Tabs tablet  Take 1 tablet by mouth daily.     Omega 3 1000 MG Caps  Take 1,000 mg by mouth daily.     omeprazole 20 MG capsule  Commonly known as:  PRILOSEC  Take 1 capsule (20 mg total) by mouth daily.     sertraline 50 MG tablet  Commonly known as:  ZOLOFT  Take 25 mg by mouth daily.     TURMERIC PO  Take 1 tablet by mouth daily.     valACYclovir 1000 MG tablet  Commonly known as:  VALTREX  Take 1,000 mg by mouth 3 (three) times daily as needed. Cold sores or fever blisters        Disposition   The patient will be discharged in stable condition to home.  Follow-up Information    Follow up with Dorris Carnes, MD.   Specialty:  Cardiology   Why:  The office will call you to make an appoinment., If you do not hear from them, please contact them. You should be called about a cardiac monitor and  follow up appointmnet in 5-6 weeks    Contact information:   [place] [address] [phone]         Duration of Discharge Encounter: Greater than 30 minutes including physician and PA time.  SignedAngelena Form R PA-C 09/17/2015, 6:52 PM   Pt seen  Agree with plans for discharge with f/u in 5 to 6 wks in clinic

## 2015-09-17 NOTE — Progress Notes (Signed)
Responded to page to assist patient with Completing Advance Directives.  Document was completed.Two copies and the original were given to patient and one copy given to patient's nurse for chart. Patient said she was going to Cath lab and want to be sure her wishes were known.  Will follow as needed.   09/17/15 1200  Clinical Encounter Type  Visited With Patient;Health care provider  Visit Type Initial;ED  Referral From Nurse  Spiritual Encounters  Spiritual Needs Literature;Emotional  Stress Factors  Patient Stress Factors None identified  Advance Directives (For Healthcare)  Does patient have an advance directive? Yes  Copy of advanced directive(s) in chart? Yes  Jaclynn Major, [place]

## 2015-09-17 NOTE — Progress Notes (Signed)
TRH band to right wrist removed for 30 minutes.  VS checked to right arm and WNL.  No s/s of hematoma or bleeding at right wrist sight.  Orders received to discharge patient.  Telemetry removed and CCMD notified.  IV removed with catheter intact.  Discharge education given with husband at bedside.  Pt to follow up with Dr. Harrington Challenger as outpatient.  Pt discharged with stable VS and no c/o chest pain or SOB.  Pt assisted with exit via [place] by NT.

## 2015-09-17 NOTE — Progress Notes (Signed)
Band to right wrist removed.  No bleeding noted.  Circulation intact.  Will cont to monitor.

## 2015-09-17 NOTE — ED Provider Notes (Signed)
CSN: [id number]     Arrival date & time 09/17/15  [id number] History   First MD Initiated Contact with Patient 09/17/15 [phone]     Chief Complaint  Patient presents with  . Chest Pain     (Consider location/radiation/quality/duration/timing/severity/associated sxs/prior Treatment) Patient is a 55 y.o. female presenting with chest pain.  Chest Pain   Sheila Nelson is a 55 y.o F with a pmhx of HTN, RAD, who presents to the ED today c/o chest pain. Pt states that she woke up around 3:30 am with midsternal and left-sided chest pressure. Patient had associated nausea, diaphoresis and dizziness that has since resolved. However, patient states that the pain has gotten progressively worse and is now sharp in nature. Pain is 10 out of 10. Patient also has pain radiating to the right side of her neck as well as tingling in her bilateral hands. Patient also states that initially she had pain in her bilateral temples which has since resolved. Lying flat exacerbates the chest pain. Chest pain is not exertional. Nothing alleviates the chest pain. Denies syncope, abdominal pain, blurry vision, headache, diarrhea, shortness of breath, cough, fever, chills, LE swelling. Denies tobacco use.   Marland Kitchen Past Medical History  Diagnosis Date  . Acute bronchitis 05/20/2010  . ADVANCED MATERNAL AGE 45/05[birth year]2010  . ALLERGIC RHINITIS, SEASONAL 10/09/2009  . ASTHMA UNSPECIFIED WITH EXACERBATION 05/20/2010  . HYPERTENSION 10/09/2009  . PLANTAR FASCIITIS, RIGHT 09/30/2010  . REACTIVE AIRWAY DISEASE 10/09/2009  . TINNITUS 10/09/2009  . Pancreatitis     Viral  . GERD (gastroesophageal reflux disease)   . Hx of viral meningitis   . Dysrhythmia     palpitations  . GDM (gestational diabetes mellitus)     glyburide  . Herpes   . H/O varicella   . Obese   . Gestational diabetes     glyburide  . Postpartum care following cesarean delivery 06/23/12) 06/24/2012   Past Surgical History  Procedure Laterality Date  . Appendectomy    . Right  thigh surgery  1973    Trauma  . Inguinal hernia repair      Infant, bilateral  . Dilation and curettage of uterus    . Mouth surgery    . Cesarean section  06/23/2012    Procedure: CESAREAN SECTION;  Surgeon: Lovenia Kim, MD;  Location: [facility];  Service: Obstetrics;  Laterality: N/A;   Family History  Problem Relation Age of Onset  . Prostate cancer Father   . Stroke Father 89  . Heart failure Father   . Hypertension Father   . Cancer Father     prostate  . Valvular heart disease Mother     MVR  . Thrombocytopenia Mother   . Birth defects Sister     tetralogy of falot  . Cancer Brother     prostate  . Hypertension Brother   . Stroke Maternal Grandmother   . Hypertension Brother   . Hypertension Brother    Social History  Substance Use Topics  . Smoking status: Never Smoker   . Smokeless tobacco: Never Used     Comment: Lives with spouse s/p wedding 07/2010. Employed as RN-pediatric critical care; now [employer]  . Alcohol Use: Yes     Comment: wine with dinner every 2 weeks   OB History    Gravida Para Term Preterm AB TAB SAB Ectopic Multiple Living   2 1 1  0 1 1 0 0 0 1     Review of Systems  Cardiovascular:  Positive for chest pain.  All other systems reviewed and are negative.     Allergies  Review of patient's allergies indicates no known allergies.  Home Medications   Prior to Admission medications   Medication Sig Start Date End Date Taking? Authorizing Provider  labetalol (NORMODYNE) 100 MG tablet Take 100 mg by mouth 2 (two) times daily.    Historical Provider, MD  Multiple Vitamin (MULTIVITAMIN WITH MINERALS) TABS tablet Take 1 tablet by mouth daily.    Historical Provider, MD  Omega 3 1000 MG CAPS Take 1,000 mg by mouth daily.    Historical Provider, MD  omeprazole (PRILOSEC) 20 MG capsule Take 1 capsule (20 mg total) by mouth daily. 09/02/15   April Palumbo, MD  sertraline (ZOLOFT) 50 MG tablet Take 50 mg by mouth daily. 09/12/15   Historical  Provider, MD  TURMERIC PO Take 1 tablet by mouth daily.    Historical Provider, MD  valACYclovir (VALTREX) 1000 MG tablet Take 1,000 mg by mouth 3 (three) times daily as needed. Cold sores or fever blisters    Historical Provider, MD   BP 145/93 mmHg  Pulse 64  Temp(Src) 97.5 F (36.4 C) (Oral)  Resp 16  SpO2 97% Physical Exam  Constitutional: She is oriented to person, place, and time. She appears well-developed and well-nourished. No distress.  HENT:  Head: Normocephalic and atraumatic.  Mouth/Throat: No oropharyngeal exudate.  Eyes: Conjunctivae and EOM are normal. Pupils are equal, round, and reactive to light. Right eye exhibits no discharge. Left eye exhibits no discharge. No scleral icterus.  Neck: Neck supple.  Cardiovascular: Normal rate, regular rhythm, normal heart sounds and intact distal pulses.  Exam reveals no gallop and no friction rub.   No murmur heard. Pulmonary/Chest: Effort normal and breath sounds normal. No respiratory distress. She has no wheezes. She has no rales. She exhibits no tenderness.  Abdominal: Soft. She exhibits no distension. There is no tenderness. There is no guarding.  Musculoskeletal: Normal range of motion. She exhibits no edema.  Lymphadenopathy:    She has no cervical adenopathy.  Neurological: She is alert and oriented to person, place, and time.  Strength 5/5 throughout. No sensory deficits.  No gait abnormality.  Skin: Skin is warm and dry. No rash noted. She is not diaphoretic. No erythema. No pallor.  Psychiatric: She has a normal mood and affect. Her behavior is normal.  Nursing note and vitals reviewed.   ED Course  Procedures (including critical care time) Labs Review Labs Reviewed  BASIC METABOLIC PANEL - Abnormal; Notable for the following:    Glucose, Bld 137 (*)    All other components within normal limits  CBC  I-STAT TROPOININ, ED    Imaging Review No results found. I have personally reviewed and evaluated these  images and lab results as part of my medical decision-making.   EKG Interpretation   Date/Time:  Tuesday September 17 2015 06:54:10 EST Ventricular Rate:  66 PR Interval:  139 QRS Duration: 95 QT Interval:  395 QTC Calculation: 414 R Axis:   77 Text Interpretation:  Sinus rhythm Since last tracing of earlier today No  significant change was found Confirmed by Eulis Foster  MD, ELLIOTT [id number]) on  09/17/2015 7:15:39 AM      MDM   Final diagnoses:  Chest pain, unspecified chest pain type    55 y.o F with a pmhx of HTN, anxiety presents with chest pain onset 4 hours ago with associated nausea, diaphoresis and dizziness. In ED chest  pain still remains, all other symptoms have resolved. PT appears to be very anxious and concerned. All VSS. Patient was recently seen at [facility] emergency department on 09/01/15 for similar symptoms. Patient was ruled out for chest pain and sent him on omeprazole which pt states hasn't helped. Patient states that since then she has had intermittent episodes of chest pressure that is worse with lying flat however, these episodes were "mild". Patient followed up with cardiology on 09/12/14 who felt that there was a possibility that her chest pain was cardiac in etiology and scheduled her for a nuclear stress test tomorrow. Patient sister passed away over the summer from sudden cardiac death which has increased patient's anxiety significantly.. No family history of MI. No personal history of diabetes or HLD. Low risk HEART score. Low risk Well's criteria. EKG wnl. Initial troponin wnl. Pt given 324 ASA,  morphine for pain as well as a GI cocktail which improved her symptoms. Pt is an established pt of Dr. Harrington Challenger, cardiologist. Given pt was scheduled to have a stress test tomorrow as ordered by Dr. Harrington Challenger, now with new and worsening symptoms will consult.   Cardiology consulted pt in ED. Plan for LHC later this afternoon.  Patient was discussed with and seen by Dr. Eulis Foster who  agrees with the treatment plan.      Dondra Spry Pryor, PA-C 09/19/15 [place], MD 09/20/15 1531

## 2015-09-17 NOTE — ED Notes (Addendum)
Presents with onset of sternal and epigastric chest pain that woke her from sleep associated with diaphoresis, nausea, vomiting and dizziness, pain is described as pressure with radiation into right side of neck. Recently started on Zoloft last week.  Lying down makes pain worse, nothing makes pain better.

## 2015-09-17 NOTE — ED Provider Notes (Signed)
  Face-to-face evaluation   History: She presents for evaluation of recurrent chest pain. The pain started about 3 AM and was felt as a pressure. It was in the center of her chest. Later she had a change in the pain to the left lower chest, and neck. These were felt as a sensation, "like when you eat a popsicle". She has ongoing chest pain, with current plans for her stress testing, tomorrow. She did not take her blood pressure medicine this morning, as directed, and preparation for the stress test. Her automated blood pressure was elevated at 250/155, at home this morning. She took a single aspirin this morning, 81 mg.  Physical exam: Alert, anxious, uncomfortable. Heart regular rate and rhythm, no murmur. Lungs good auscultation. Chest nontender to palpation.  Medical screening examination/treatment/procedure(s) were conducted as a shared visit with non-physician practitioner(s) and myself.  I personally evaluated the patient during the encounter  Daleen Bo, MD 09/20/15 1531

## 2015-09-17 NOTE — Interval H&P Note (Signed)
History and Physical Interval Note:  09/17/2015 3:35 PM  Sheila Nelson  has presented today for cardiac cath with the diagnosis of chest pain. The various methods of treatment have been discussed with the patient and family. After consideration of risks, benefits and other options for treatment, the patient has consented to  Procedure(s): Left Heart Cath and Coronary Angiography (N/A) as a surgical intervention .  The patient's history has been reviewed, patient examined, no change in status, stable for surgery.  I have reviewed the patient's chart and labs.  Questions were answered to the patient's satisfaction.    Cath Lab Visit (complete for each Cath Lab visit)  Clinical Evaluation Leading to the Procedure:   ACS: No.  Non-ACS:    Anginal Classification: CCS III  Anti-ischemic medical therapy: Minimal Therapy (1 class of medications)  Non-Invasive Test Results: No non-invasive testing performed  Prior CABG: No previous CABG         Nelson,Sheila

## 2015-09-17 NOTE — H&P (Signed)
Patient ID: Sheila Nelson MRN: [id number], DOB/AGE: 53-10-1960   Admit date: 09/17/2015   Primary Physician: Serita Grammes, CNM Primary Cardiologist: Dr. Harrington Challenger   Pt. Profile:  Sheila Nelson is a 55 y.o. female with a history of HTN, obesity and family hx of SCD who presented to [facility] ED today with recurrent chest pain.   Family history is notable for sudden death of her sister, at age 20, which was presumed to be from SCD this past summer. Her mother had MVP, requiring MV repair. The patient denies any personal history of DM, HLD or tobacco use. She reports that in 2012, she had a negative stress test and normal echocardiogram. She also has a 10 year old son after using IVF at the age of 84 to get pregnant.  On 09/01/2015 she presented to the [facility] ER with chest pain. EKG and cardiac enzymes were negative. Lipase also negative. She was released from the ED and given a Rx for a PPI. She did not have any improvement after starting Prilosec. She was instructed by the ED to f/u with Dr. Brigitte Pulse, and subsequently referred to our office.   She was seen last week as a new patient in our office by Dr. Harrington Challenger and Lyda Jester PA-C for evaluation of exertional chest pain. She was set up for exercise myoview which was scheduled for 09/08/15 ( tomorrow. ) However, she woke up this morning with sternal and epigastric chest pain that woke her from sleep associated with diaphoresis, nausea, vomiting and dizziness. She did not take her blood pressure medicine this morning, as directed, and preparation for the stress test. Her automated blood pressure was elevated at 250/155.   BP now much better controlled.   She continues to have 4/10 chest pressure that radiates into her left breast. Also has pain in her temples.    Problem List  Past Medical History  Diagnosis Date  . Acute bronchitis 05/20/2010  . ADVANCED MATERNAL AGE 25/05/2010  . ALLERGIC RHINITIS, SEASONAL 10/09/2009  . ASTHMA UNSPECIFIED WITH  EXACERBATION 05/20/2010  . HYPERTENSION 10/09/2009  . PLANTAR FASCIITIS, RIGHT 09/30/2010  . REACTIVE AIRWAY DISEASE 10/09/2009  . TINNITUS 10/09/2009  . Pancreatitis     Viral  . GERD (gastroesophageal reflux disease)   . Hx of viral meningitis   . Dysrhythmia     palpitations  . GDM (gestational diabetes mellitus)     glyburide  . Herpes   . H/O varicella   . Obese   . Gestational diabetes     glyburide  . Postpartum care following cesarean delivery 06/23/12) 06/24/2012    Past Surgical History  Procedure Laterality Date  . Appendectomy    . Right thigh surgery  1973    Trauma  . Inguinal hernia repair      Infant, bilateral  . Dilation and curettage of uterus    . Mouth surgery    . Cesarean section  06/23/2012    Procedure: CESAREAN SECTION;  Surgeon: Lovenia Kim, MD;  Location: [facility];  Service: Obstetrics;  Laterality: N/A;     Allergies  No Known Allergies   Home Medications  Prior to Admission medications   Medication Sig Start Date End Date Taking? Authorizing Provider  aspirin 81 MG tablet Take 81 mg by mouth daily.   Yes Historical Provider, MD  ibuprofen (ADVIL,MOTRIN) 600 MG tablet Take 600 mg by mouth every 6 (six) hours as needed for fever.   Yes Historical Provider, MD  labetalol (  NORMODYNE) 100 MG tablet Take 100 mg by mouth daily.    Yes Historical Provider, MD  Multiple Vitamin (MULTIVITAMIN WITH MINERALS) TABS tablet Take 1 tablet by mouth daily.   Yes Historical Provider, MD  Omega 3 1000 MG CAPS Take 1,000 mg by mouth daily.   Yes Historical Provider, MD  omeprazole (PRILOSEC) 20 MG capsule Take 1 capsule (20 mg total) by mouth daily. 09/02/15  Yes April Palumbo, MD  sertraline (ZOLOFT) 50 MG tablet Take 25 mg by mouth daily.  09/12/15  Yes Historical Provider, MD  TURMERIC PO Take 1 tablet by mouth daily.   Yes Historical Provider, MD  valACYclovir (VALTREX) 1000 MG tablet Take 1,000 mg by mouth 3 (three) times daily as needed. Cold sores or fever  blisters   Yes Historical Provider, MD    Family History  Family History  Problem Relation Age of Onset  . Prostate cancer Father   . Stroke Father 43  . Heart failure Father   . Hypertension Father   . Cancer Father     prostate  . Valvular heart disease Mother     MVR  . Thrombocytopenia Mother   . Birth defects Sister     tetralogy of falot  . Cancer Brother     prostate  . Hypertension Brother   . Stroke Maternal Grandmother   . Hypertension Brother   . Hypertension Brother    Family Status  Relation Status Death Age  . Father Deceased   . Mother Deceased   . Sister Deceased   . Brother Alive   . Maternal Grandmother Deceased   . Brother Alive   . Brother Alive      Social History  Social History   Social History  . Marital Status: Single    Spouse Name: N/A  . Number of Children: 0  . Years of Education: N/A   Occupational History  .  [place] [place]   Social History Main Topics  . Smoking status: Never Smoker   . Smokeless tobacco: Never Used     Comment: Lives with spouse s/p wedding 07/2010. Employed as RN-pediatric critical care; now [employer]  . Alcohol Use: Yes     Comment: wine with dinner every 2 weeks  . Drug Use: No  . Sexual Activity: Yes   Other Topics Concern  . 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       Review of Systems General:  No chills, fever, night sweats or weight changes.  Cardiovascular: +++ chest pain, dyspnea on exertion, No edema, orthopnea, palpitations, paroxysmal nocturnal dyspnea. Dermatological: No rash, lesions/masses Respiratory: No cough, dyspnea Urologic: No hematuria, dysuria Abdominal:   No nausea, vomiting, diarrhea, bright red blood per rectum, melena, or hematemesis Neurologic:  No visual changes, wkns, changes in mental status. All other systems reviewed and are otherwise negative except as noted above.  Physical Exam  Blood pressure 119/74, pulse 62, temperature 97.5  F (36.4 C), temperature source Oral, resp. rate 11, SpO2 100 %, unknown if currently breastfeeding.  General: Pleasant, NAD Psych: Normal affect. Neuro: Alert and oriented X 3. Moves all extremities spontaneously. HEENT: Normal  Neck: Supple without bruits or JVD. Lungs:  Resp regular and unlabored, CTA. Heart: RRR no s3, s4, or murmurs. Abdomen: Soft, non-tender, non-distended, BS + x 4.  Extremities: No clubbing, cyanosis or edema. DP/PT/Radials 2+ and equal bilaterally.  Labs  No results for input(s): CKTOTAL, CKMB, TROPONINI in  the last 72 hours. Lab Results  Component Value Date   WBC 5.2 09/17/2015   HGB 13.6 09/17/2015   HCT 40.1 09/17/2015   MCV 90.1 09/17/2015   PLT 226 09/17/2015    Recent Labs Lab 09/17/15 0645  NA 137  K 4.1  CL 102  CO2 26  BUN 16  CREATININE 0.91  CALCIUM 9.9  GLUCOSE 137*   No results found for: CHOL, HDL, LDLCALC, TRIG No results found for: DDIMER   Radiology/Studies  Dg Chest 2 View  09/01/2015  CLINICAL DATA:  Acute onset of left-sided chest pain and high blood pressure. Initial encounter. EXAM: CHEST  2 VIEW COMPARISON:  None. FINDINGS: The lungs are well-aerated. Minimal left basilar atelectasis is noted. There is no evidence of pleural effusion or pneumothorax. The heart is normal in size; the mediastinal contour is within normal limits. No acute osseous abnormalities are seen. IMPRESSION: Minimal left basilar atelectasis noted.  Lungs otherwise clear Electronically Signed   By: Garald Balding M.D.   On: 09/01/2015 22:54   Dg Chest Portable 1 View  09/17/2015  CLINICAL DATA:  Chest pain. EXAM: PORTABLE CHEST 1 VIEW COMPARISON:  September 01, 2015. FINDINGS: The heart size and mediastinal contours are within normal limits. Both lungs are clear. No pneumothorax or pleural effusion is noted. The visualized skeletal structures are unremarkable. IMPRESSION: No acute cardiopulmonary abnormality seen. Electronically Signed   By: Marijo Conception,  M.D.   On: 09/17/2015 09:45    ECG  NSR HR 74  ASSESSMENT AND PLAN  Sheila Nelson is a 55 y.o. female with a history of HTN, obesity and family hx of SCD who presented to [facility] ED today with recurrent chest pain.   Chest pain: troponin x1. ECG with no ST or TW changes. She has a history of exertional chest pain and has RFs including HTN and family h/o SCD in a first degree relative. She was supposed to have a stress test tomorrow but now shows up with intractable chest pain.  -- We will plan for LHC later this afternoon.   HTN: patient reports BP being quite elevated this AM. Now normotensive. Continue current meds.    SignedEileen Stanford, PA-C 09/17/2015, 10:24 AM  Pager [phone]  Pt seen and examined  I agree with findings as noted above by Kathlene November.   I am familiar with pt  I just saw her in clinic last week  Presents to ER with CP  Some parts of history concerning . ON exam lungs are clear; cardiac exam shows RRR  No murmurs.  Ext no edema EKG without acute change Given multiple presentations to ER and FHx with sudden death in sister would recomm L heart cath to define anatomy Pt understands and agrees to proceed. Follow BP.  Dorris Carnes

## 2015-09-17 NOTE — ED Notes (Signed)
Samantha, PA at bedside at this time.

## 2015-09-18 ENCOUNTER — Ambulatory Visit (INDEPENDENT_AMBULATORY_CARE_PROVIDER_SITE_OTHER): Payer: BC Managed Care – PPO

## 2015-09-18 ENCOUNTER — Encounter (HOSPITAL_COMMUNITY): Payer: Self-pay | Admitting: Cardiovascular Disease

## 2015-09-18 ENCOUNTER — Ambulatory Visit (HOSPITAL_COMMUNITY): Payer: BC Managed Care – PPO

## 2015-09-18 DIAGNOSIS — I493 Ventricular premature depolarization: Secondary | ICD-10-CM | POA: Diagnosis not present

## 2015-09-26 ENCOUNTER — Telehealth: Payer: Self-pay | Admitting: Internal Medicine

## 2015-09-26 NOTE — Telephone Encounter (Signed)
Left voice message for patient. Ok to return to her usual exercise routine, as tolerated.  Ease off if has the chest pain/symptoms that have occurred in the past.  Advised that it is a good idea to do her usual activity while wearing the monitor.  Per dc instructions patient needs post hosp follow up.  Sending message to scheduler for patient to be seen in about 5 weeks.

## 2015-09-26 NOTE — Telephone Encounter (Addendum)
New message     Pt recently had a cath---can she exercise at the gym?  She is also wearing a monitor but is having battery problems now.  The company is sending her either a replacement or new batteries.  It is not hooked up at this time.  Ok to leave msg on phone---pt says she is at school and sometimes calls will not go thru.

## 2015-09-26 NOTE — Telephone Encounter (Signed)
New problem    Pt want to know if she can do any type of exercising. Please advise pt.

## 2015-09-26 NOTE — Telephone Encounter (Signed)
No chest pain or pressure Stopped taking omeprazole because she didn't think it was helping.  Reports besides the battery issue with the monitor, it is cumbersome to her a a busy mom of a 55 year old.  It has come off several times and fallen.  It is also difficult to sleep with because it has a short "leash".  She has not had time to contact the company about the battery issue.  She has not spoken to our monitor department yet.  Advised patient I will let the monitor techs know this information and they may be able to help get a different monitor, new battery, etc.  Adv we would call her back with any update.  She is very appreciative for the help.

## 2015-10-08 ENCOUNTER — Telehealth: Payer: Self-pay | Admitting: *Deleted

## 2015-10-08 ENCOUNTER — Encounter: Payer: Self-pay | Admitting: *Deleted

## 2015-10-08 NOTE — Telephone Encounter (Signed)
[employer] reached out to patient to see if she needed assistance with her cardiac event monitor.  Patient states she has already mailed the monitor back.

## 2015-10-08 NOTE — Progress Notes (Signed)
Patient ID: Sheila Nelson, female   DOB: Sep 21, 1960, 55 y.o.   MRN: [id number] [employer] called regarding patient statement of battery not working.  Per [employer], the patient had not contacted them regarding any issues and has not transmitted since 09/24/2015.  They will reach out to the patient to see if she needs assistance with monitor.

## 2015-10-20 ENCOUNTER — Ambulatory Visit (INDEPENDENT_AMBULATORY_CARE_PROVIDER_SITE_OTHER): Payer: BC Managed Care – PPO | Admitting: Family Medicine

## 2015-10-20 VITALS — BP 118/80 | HR 91 | Temp 99.7°F | Resp 16 | Ht 64.0 in | Wt 210.8 lb

## 2015-10-20 DIAGNOSIS — J111 Influenza due to unidentified influenza virus with other respiratory manifestations: Secondary | ICD-10-CM | POA: Diagnosis not present

## 2015-10-20 DIAGNOSIS — R05 Cough: Secondary | ICD-10-CM | POA: Diagnosis not present

## 2015-10-20 DIAGNOSIS — R059 Cough, unspecified: Secondary | ICD-10-CM

## 2015-10-20 MED ORDER — HYDROCODONE-HOMATROPINE 5-1.5 MG/5ML PO SYRP
ORAL_SOLUTION | ORAL | Status: DC
Start: 2015-10-20 — End: 2016-04-11

## 2015-10-20 MED ORDER — BENZONATATE 100 MG PO CAPS: 100.0000 mg | ORAL_CAPSULE | Freq: Three times a day (TID) | ORAL | Status: DC | PRN

## 2015-10-20 NOTE — Progress Notes (Signed)
Subjective:    Patient ID: Sheila Nelson, female    DOB: 18-Aug-1961, 55 y.o.   MRN: [id number]  HPI Sheila Nelson is a 55 y.o. female   Started 3 days ago with body aches and malaise. Fever started 3 nights ago, then cough yesterday morning. Hurting all over, pain in ribs, headache, and sore in muscles of neck. Cough keeping her up at night - taking otc mucinex. Keeping other meds down. No photophobia or phonophobia.  Sick contact - 52yo son, had fever this week and runny nose, and kids at house that had tested positive for flu.  He's improved.   Did receive flu vaccine this year.    Tx: alternating tylenol and ibuprofen. Temp comes down to 99.5 - 100.5.   Patient Active Problem List   Diagnosis Date Noted  . Chest pain 09/17/2015  . Essential hypertension   . GERD (gastroesophageal reflux disease)   . Obesity   . PVC's (premature ventricular contractions)   . HTN (hypertension)   . Chest pain on exertion 09/13/2015  . Postpartum care following cesarean delivery 06/23/12) 06/24/2012  . Palpitations 03/08/2012  . Leg abscess 05/07/2011  . PLANTAR FASCIITIS, RIGHT 09/30/2010  . TINNITUS 10/09/2009  . HYPERTENSION 10/09/2009  . ALLERGIC RHINITIS, SEASONAL 10/09/2009  . REACTIVE AIRWAY DISEASE 10/09/2009  . ADVANCED MATERNAL AGE 20/05/2010   Past Medical History  Diagnosis Date  . ASTHMA UNSPECIFIED WITH EXACERBATION   . PLANTAR FASCIITIS, RIGHT 09/30/2010  . REACTIVE AIRWAY DISEASE 10/09/2009  . TINNITUS 10/09/2009  . Pancreatitis     Viral  . GERD (gastroesophageal reflux disease)   . Hx of viral meningitis   . Herpes   . H/O varicella   . Obesity   . Chest pain     a. 09/2015 felt to be non cardiac after LHC with no CAD   . PVC's (premature ventricular contractions)   . HTN (hypertension)    Past Surgical History  Procedure Laterality Date  . Appendectomy    . Right thigh surgery  1973    Trauma  . Inguinal hernia repair      Infant, bilateral  . Dilation  and curettage of uterus    . Mouth surgery    . Cesarean section  06/23/2012    Procedure: CESAREAN SECTION;  Surgeon: Lovenia Kim, MD;  Location: [facility];  Service: Obstetrics;  Laterality: N/A;  . Cardiac catheterization N/A 09/17/2015    Procedure: Left Heart Cath and Coronary Angiography;  Surgeon: Burnell Blanks, MD;  Location: [facility];  Service: Cardiovascular;  Laterality: N/A;   No Known Allergies Prior to Admission medications   Medication Sig Start Date End Date Taking? Authorizing Provider  aspirin EC 81 MG tablet Take 81 mg by mouth daily.   Yes Historical Provider, MD  ibuprofen (ADVIL,MOTRIN) 600 MG tablet Take 600 mg by mouth every 6 (six) hours as needed for fever.   Yes Historical Provider, MD  labetalol (NORMODYNE) 100 MG tablet Take 100 mg by mouth daily.    Yes Historical Provider, MD  Multiple Vitamin (MULTIVITAMIN WITH MINERALS) TABS tablet Take 1 tablet by mouth daily.   Yes Historical Provider, MD  Omega 3 1000 MG CAPS Take 1,000 mg by mouth daily.   Yes Historical Provider, MD  valACYclovir (VALTREX) 1000 MG tablet Take 1,000 mg by mouth 3 (three) times daily as needed. Reported on 10/20/2015   Yes Historical Provider, MD  omeprazole (PRILOSEC) 20 MG capsule Take 1 capsule (  20 mg total) by mouth daily. Patient not taking: Reported on 10/20/2015 09/02/15   April Palumbo, MD  sertraline (ZOLOFT) 50 MG tablet Take 25 mg by mouth daily. Reported on 10/20/2015 09/12/15   Historical Provider, MD  TURMERIC PO Take 1 tablet by mouth daily. Reported on 10/20/2015    Historical Provider, MD   Social History   Social History  . Marital Status: Single    Spouse Name: N/A  . Number of Children: 0  . Years of Education: N/A   Occupational History  .  [employer]   Social History Main Topics  . Smoking status: Never Smoker   . Smokeless tobacco: Never Used     Comment: Lives with spouse s/p wedding 07/2010. Employed as RN-pediatric critical care; now [employer]    . Alcohol Use: Yes     Comment: wine with dinner every 2 weeks  . Drug Use: No  . Sexual Activity: Yes   Other Topics Concern  . 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       Review of Systems  Constitutional: Positive for fever and chills.  Respiratory: Positive for cough. Negative for shortness of breath.   Musculoskeletal: Positive for myalgias and arthralgias.  Neurological: Positive for headaches.       Objective:   Physical Exam  Constitutional: She is oriented to person, place, and time. She appears well-developed and well-nourished. No distress.  HENT:  Head: Normocephalic and atraumatic.  Right Ear: Hearing, tympanic membrane, external ear and ear canal normal.  Left Ear: Hearing, tympanic membrane, external ear and ear canal normal.  Nose: Nose normal.  Mouth/Throat: Oropharynx is clear and moist. No oropharyngeal exudate.  Eyes: Conjunctivae and EOM are normal. Pupils are equal, round, and reactive to light.  Neck: Neck supple.  Cardiovascular: Normal rate, regular rhythm, normal heart sounds and intact distal pulses.   No murmur heard. Pulmonary/Chest: Effort normal and breath sounds normal. No respiratory distress. She has no wheezes. She has no rhonchi.  Neurological: She is alert and oriented to person, place, and time.  Skin: Skin is warm and dry. No rash noted. She is not diaphoretic.  Psychiatric: She has a normal mood and affect. Her behavior is normal.  Vitals reviewed.  Filed Vitals:   10/20/15 1531  BP: 118/80  Pulse: 91  Temp: 99.7 F (37.6 C)  TempSrc: Oral  Resp: 16  Height: 5\' 4"  (1.626 m)  Weight: 210 lb 12.8 oz (95.618 kg)  SpO2: 96%       Assessment & Plan:   Sheila Nelson is a 55 y.o. female Cough - Plan: benzonatate (TESSALON) 100 MG capsule, HYDROcodone-homatropine (HYCODAN) 5-1.5 MG/5ML syrup  Influenza with respiratory manifestation - Plan: benzonatate (TESSALON) 100 MG capsule,  HYDROcodone-homatropine (HYCODAN) 5-1.5 MG/5ML syrup Clinically appears to have influenza. Discussed testing, but with typical symptoms, and sick contacts with influenza, testing was deferred. She is beyond timing for Tamiflu, so other symptomatic care discussed.   -Tessalon Perles 3 times a day when necessary.  - Over-the-counter Mucinex, Tylenol or Motrin as needed.   -Hycodan cough syrup as needed at night. Side effects discussed.   -RTC precautions discussed.  Meds ordered this encounter  Medications  . aspirin EC 81 MG tablet    Sig: Take 81 mg by mouth daily.  . benzonatate (TESSALON) 100 MG capsule    Sig: Take 1 capsule (100 mg total) by mouth 3 (three) times daily as needed  for cough.    Dispense:  20 capsule    Refill:  0  . HYDROcodone-homatropine (HYCODAN) 5-1.5 MG/5ML syrup    Sig: 96m by mouth a bedtime as needed for cough.    Dispense:  120 mL    Refill:  0   Patient Instructions  mucinex or tessalon during the day, hydrocodone cough syrup at night. Tylenol or Motrin as needed for fever.  Rest and drink plenty of fluids. If fevers are not improving in the next 2-3 days at the most, or worsening sooner, return here or other provider for further testing.  Return to the clinic or go to the nearest emergency room if any of your symptoms worsen or new symptoms occur.  Influenza, Adult Influenza ("the flu") is a viral infection of the respiratory tract. It occurs more often in winter months because people spend more time in close contact with one another. Influenza can make you feel very sick. Influenza easily spreads from person to person (contagious). CAUSES  Influenza is caused by a virus that infects the respiratory tract. You can catch the virus by breathing in droplets from an infected person's cough or sneeze. You can also catch the virus by touching something that was recently contaminated with the virus and then touching your mouth, nose, or eyes. RISKS AND  COMPLICATIONS You may be at risk for a more severe case of influenza if you smoke cigarettes, have diabetes, have chronic heart disease (such as heart failure) or lung disease (such as asthma), or if you have a weakened immune system. Elderly people and pregnant women are also at risk for more serious infections. The most common problem of influenza is a lung infection (pneumonia). Sometimes, this problem can require emergency medical care and may be life threatening. SIGNS AND SYMPTOMS  Symptoms typically last 4 to 10 days and may include:  Fever.  Chills.  Headache, body aches, and muscle aches.  Sore throat.  Chest discomfort and cough.  Poor appetite.  Weakness or feeling tired.  Dizziness.  Nausea or vomiting. DIAGNOSIS  Diagnosis of influenza is often made based on your history and a physical exam. A nose or throat swab test can be done to confirm the diagnosis. TREATMENT  In mild cases, influenza goes away on its own. Treatment is directed at relieving symptoms. For more severe cases, your health care provider may prescribe antiviral medicines to shorten the sickness. Antibiotic medicines are not effective because the infection is caused by a virus, not by bacteria. HOME CARE INSTRUCTIONS  Take medicines only as directed by your health care provider.  Use a cool mist humidifier to make breathing easier.  Get plenty of rest until your temperature returns to normal. This usually takes 3 to 4 days.  Drink enough fluid to keep your urine clear or pale yellow.  Cover yourmouth and nosewhen coughing or sneezing,and wash your handswellto prevent thevirusfrom spreading.  Stay homefromwork orschool untilthe fever is gonefor at least 48full day. PREVENTION  An annual influenza vaccination (flu shot) is the best way to avoid getting influenza. An annual flu shot is now routinely recommended for all adults in the [place] IF:  You experiencechest pain,  yourcough worsens,or you producemore mucus.  Youhave nausea,vomiting, ordiarrhea.  Your fever returns or gets worse. SEEK IMMEDIATE MEDICAL CARE IF:  You havetrouble breathing, you become short of breath,or your skin ornails becomebluish.  You have severe painor stiffnessin the neck.  You develop a sudden headache, or pain in  the face or ear.  You have nausea or vomiting that you cannot control. MAKE SURE YOU:   Understand these instructions.  Will watch your condition.  Will get help right away if you are not doing well or get worse.   This information is not intended to replace advice given to you by your health care provider. Make sure you discuss any questions you have with your health care provider.   Document Released: 08/14/2000 Document Revised: 09/07/2014 Document Reviewed: 11/16/2011 Elsevier Interactive Patient Education [employer].

## 2015-10-20 NOTE — Patient Instructions (Signed)
mucinex or tessalon during the day, hydrocodone cough syrup at night. Tylenol or Motrin as needed for fever.  Rest and drink plenty of fluids. If fevers are not improving in the next 2-3 days at the most, or worsening sooner, return here or other provider for further testing.  Return to the clinic or go to the nearest emergency room if any of your symptoms worsen or new symptoms occur.  Influenza, Adult Influenza ("the flu") is a viral infection of the respiratory tract. It occurs more often in winter months because people spend more time in close contact with one another. Influenza can make you feel very sick. Influenza easily spreads from person to person (contagious). CAUSES  Influenza is caused by a virus that infects the respiratory tract. You can catch the virus by breathing in droplets from an infected person's cough or sneeze. You can also catch the virus by touching something that was recently contaminated with the virus and then touching your mouth, nose, or eyes. RISKS AND COMPLICATIONS You may be at risk for a more severe case of influenza if you smoke cigarettes, have diabetes, have chronic heart disease (such as heart failure) or lung disease (such as asthma), or if you have a weakened immune system. Elderly people and pregnant women are also at risk for more serious infections. The most common problem of influenza is a lung infection (pneumonia). Sometimes, this problem can require emergency medical care and may be life threatening. SIGNS AND SYMPTOMS  Symptoms typically last 4 to 10 days and may include:  Fever.  Chills.  Headache, body aches, and muscle aches.  Sore throat.  Chest discomfort and cough.  Poor appetite.  Weakness or feeling tired.  Dizziness.  Nausea or vomiting. DIAGNOSIS  Diagnosis of influenza is often made based on your history and a physical exam. A nose or throat swab test can be done to confirm the diagnosis. TREATMENT  In mild cases, influenza  goes away on its own. Treatment is directed at relieving symptoms. For more severe cases, your health care provider may prescribe antiviral medicines to shorten the sickness. Antibiotic medicines are not effective because the infection is caused by a virus, not by bacteria. HOME CARE INSTRUCTIONS  Take medicines only as directed by your health care provider.  Use a cool mist humidifier to make breathing easier.  Get plenty of rest until your temperature returns to normal. This usually takes 3 to 4 days.  Drink enough fluid to keep your urine clear or pale yellow.  Cover yourmouth and nosewhen coughing or sneezing,and wash your handswellto prevent thevirusfrom spreading.  Stay homefromwork orschool untilthe fever is gonefor at least 58full day. PREVENTION  An annual influenza vaccination (flu shot) is the best way to avoid getting influenza. An annual flu shot is now routinely recommended for all adults in the [place] IF:  You experiencechest pain, yourcough worsens,or you producemore mucus.  Youhave nausea,vomiting, ordiarrhea.  Your fever returns or gets worse. SEEK IMMEDIATE MEDICAL CARE IF:  You havetrouble breathing, you become short of breath,or your skin ornails becomebluish.  You have severe painor stiffnessin the neck.  You develop a sudden headache, or pain in the face or ear.  You have nausea or vomiting that you cannot control. MAKE SURE YOU:   Understand these instructions.  Will watch your condition.  Will get help right away if you are not doing well or get worse.   This information is not intended to replace advice given to  you by your health care provider. Make sure you discuss any questions you have with your health care provider.   Document Released: 08/14/2000 Document Revised: 09/07/2014 Document Reviewed: 11/16/2011 Elsevier Interactive Patient Education [employer].

## 2015-11-01 ENCOUNTER — Encounter: Payer: BC Managed Care – PPO | Admitting: Internal Medicine

## 2015-12-24 DIAGNOSIS — Z319 Encounter for procreative management, unspecified: Secondary | ICD-10-CM | POA: Diagnosis not present

## 2016-04-11 ENCOUNTER — Ambulatory Visit (INDEPENDENT_AMBULATORY_CARE_PROVIDER_SITE_OTHER): Payer: Self-pay | Admitting: Family Medicine

## 2016-04-11 ENCOUNTER — Encounter: Payer: Self-pay | Admitting: Family Medicine

## 2016-04-11 VITALS — BP 122/72 | HR 81 | Temp 98.3°F | Resp 17 | Ht 64.5 in | Wt 206.0 lb

## 2016-04-11 DIAGNOSIS — R51 Headache: Secondary | ICD-10-CM

## 2016-04-11 DIAGNOSIS — R52 Pain, unspecified: Secondary | ICD-10-CM

## 2016-04-11 DIAGNOSIS — R519 Headache, unspecified: Secondary | ICD-10-CM

## 2016-04-11 LAB — POCT CBC
Granulocyte percent: 61.1 %G (ref 37–80)
HCT, POC: 37.2 % — AB (ref 37.7–47.9)
HEMOGLOBIN: 13.2 g/dL (ref 12.2–16.2)
Lymph, poc: 2 (ref 0.6–3.4)
MCH: 31 pg (ref 27–31.2)
MCHC: 35.5 g/dL — AB (ref 31.8–35.4)
MCV: 87.3 fL (ref 80–97)
MID (cbc): 0.3 (ref 0–0.9)
MPV: 7.4 fL (ref 0–99.8)
PLATELET COUNT, POC: 220 10*3/uL (ref 142–424)
POC Granulocyte: 3.7 (ref 2–6.9)
POC LYMPH PERCENT: 33.4 %L (ref 10–50)
POC MID %: 5.5 %M (ref 0–12)
RBC: 4.27 M/uL (ref 4.04–5.48)
RDW, POC: 12.7 %
WBC: 6 10*3/uL (ref 4.6–10.2)

## 2016-04-11 MED ORDER — PREDNISONE 10 MG PO TABS: 30.0000 mg | ORAL_TABLET | Freq: Every day | ORAL | 0 refills | Status: DC

## 2016-04-11 MED ORDER — DOXYCYCLINE HYCLATE 100 MG PO TABS: 100.0000 mg | ORAL_TABLET | Freq: Two times a day (BID) | ORAL | 0 refills | Status: AC

## 2016-04-11 NOTE — Patient Instructions (Addendum)
Thank you for coming in today. Call or go to the emergency room if you get worse, have trouble breathing, have chest pains, or palpitations.  Take doxycycline twice daily for 1 week.  Continue tylenol or ibuprofen.  IF not better take prednisone backup.  Return as needed.   Enterovirus D68, Adult Enterovirus D68 is a common virus that causes a fever, cough, and nasal congestion (upper respiratory infection). Both children and adults can be infected with the virus. However, many adults are resistant to the virus, so infection is more common in children. Enterovirus D68 spreads from person to person through coughing and sneezing. The virus is present in the fluid of an infected person's lungs (upper respiratory secretions). When a sick person coughs or sneezes, particles get released into the air. You can get infected from breathing in these particles. The virus may also be on any surface where these particles land. You can get infected by touching the surface and then touching your nose or mouth. SIGNS AND SYMPTOMS  For most adults, symptoms of infection are mild. Symptoms may include:  Fever.  Nasal congestion.  Sneezing.  Muscle aches.  Cough.  Wheezing.  Trouble breathing. DIAGNOSIS  Your health care provider will diagnose the infection based on symptoms and a physical exam. Your health care provider may also want to do a chest X-ray if there is breathing trouble or wheezing. TREATMENT  There is no treatment or vaccine for this infection. Most people recover completely after resting at home for several days.  HOME CARE INSTRUCTIONS  Do not take any medicines unless your health care provider says it is okay.  Rest at home until symptoms go away. Do not go to work while you have symptoms.  Drink enough fluids to keep urine clear or pale yellow.  Keep all your follow-up appointments.  Follow any other instructions given to you by your health care provider. SEEK MEDICAL CARE  IF:  You are taking acetaminophen and it is not controlling your fever or symptoms.  You have nausea or vomiting.  You cannot keep down fluids. SEEK IMMEDIATE MEDICAL CARE IF:  You have a fever and symptoms that last longer than 3 days.  You develop wheezing.  You develop breathing problems. MAKE SURE YOU:  Understand these instructions.  Will watch your condition.  Will get help right away if you are not doing well or get worse.   This information is not intended to replace advice given to you by your health care provider. Make sure you discuss any questions you have with your health care provider.   Document Released: 08/22/2013 Document Revised: 09/07/2014 Document Reviewed: 08/22/2013 Elsevier Interactive Patient Education [employer].    IF you received an x-ray today, you will receive an invoice from [facility]. Please contact [facility] at [phone] with questions or concerns regarding your invoice.   IF you received labwork today, you will receive an invoice from [employer]. Please contact [facility] at [phone] with questions or concerns regarding your invoice.   Our billing staff will not be able to assist you with questions regarding bills from these companies.  You will be contacted with the lab results as soon as they are available. The fastest way to get your results is to activate your My Chart account. Instructions are located on the last page of this paperwork. If you have not heard from [place] regarding the results in 2 weeks, please contact this office.    We recommend  that you schedule a mammogram for breast cancer screening. Typically, you do not need a referral to do this. Please contact a local imaging center to schedule your mammogram.  [facility] - [phone]  *ask for the Radiology Department [facility] ([place]) - [phone] or [phone]  [facility] - [phone] [place] [phone] [facility] - [phone]  *ask for the [facility] - [phone]  *ask for the Radiology Department [facility] - [phone]  *ask for the [facility] - [phone]

## 2016-04-11 NOTE — Progress Notes (Signed)
Sheila Nelson is a 55 y.o. female who presents to [facility] today for cough congestion headache body aches. Symptoms present for about a week. Patient has also developed neck pain and left upper quadrant abdominal pain over the past few days. She denies any fevers or vomiting diarrhea or rash. She denies tick bites but notes that she has been exposed to the women's recently. She does note a sick contact about 2 weeks ago. She works as a Marine scientist. She's been taking Tylenol ibuprofen with minimal improvement of symptoms. She notes that her current symptoms feel like the flu.   Past Medical History:  Diagnosis Date  . ASTHMA UNSPECIFIED WITH EXACERBATION   . Chest pain    a. 09/2015 felt to be non cardiac after LHC with no CAD   . GERD (gastroesophageal reflux disease)   . H/O varicella   . Herpes   . HTN (hypertension)   . Hx of viral meningitis   . Obesity   . Pancreatitis    Viral  . PLANTAR FASCIITIS, RIGHT 09/30/2010  . PVC's (premature ventricular contractions)   . REACTIVE AIRWAY DISEASE 10/09/2009  . TINNITUS 10/09/2009   Past Surgical History:  Procedure Laterality Date  . APPENDECTOMY    . CARDIAC CATHETERIZATION N/A 09/17/2015   Procedure: Left Heart Cath and Coronary Angiography;  Surgeon: Burnell Blanks, MD;  Location: [facility];  Service: Cardiovascular;  Laterality: N/A;  . CESAREAN SECTION  06/23/2012   Procedure: CESAREAN SECTION;  Surgeon: Lovenia Kim, MD;  Location: [facility];  Service: Obstetrics;  Laterality: N/A;  . DILATION AND CURETTAGE OF UTERUS    . INGUINAL HERNIA REPAIR     Infant, bilateral  . MOUTH SURGERY    . Right thigh surgery  1973   Trauma   Social History  Substance Use Topics  . Smoking status: Never Smoker  . Smokeless tobacco: Never Used     Comment: Lives with spouse s/p wedding 07/2010. 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 Alcohol use Yes     Comment: wine with dinner every 2 weeks   ROS as  above Medications: Current Outpatient Prescriptions  Medication Sig Dispense Refill  . aspirin EC 81 MG tablet Take 81 mg by mouth daily.    Marland Kitchen ibuprofen (ADVIL,MOTRIN) 600 MG tablet Take 600 mg by mouth every 6 (six) hours as needed for fever.    . Multiple Vitamin (MULTIVITAMIN WITH MINERALS) TABS tablet Take 1 tablet by mouth daily.    . Omega 3 1000 MG CAPS Take 1,000 mg by mouth daily.    . TURMERIC PO Take 1 tablet by mouth daily. Reported on 10/20/2015    . valACYclovir (VALTREX) 1000 MG tablet Take 1,000 mg by mouth 3 (three) times daily as needed. Reported on 10/20/2015    . doxycycline (VIBRA-TABS) 100 MG tablet Take 1 tablet (100 mg total) by mouth 2 (two) times daily. 14 tablet 0  . predniSONE (DELTASONE) 10 MG tablet Take 3 tablets (30 mg total) by mouth daily with breakfast. 15 tablet 0   No current facility-administered medications for this visit.    No Known Allergies   Exam:  BP 122/72 (BP Location: Right Arm, Patient Position: Sitting, Cuff Size: Normal)   Pulse 81   Temp 98.3 F (36.8 C) (Oral)   Resp 17   Ht 5' 4.5" (1.638 m)   Wt 206 lb (93.4 kg)   LMP 09/19/2011   SpO2 95%   Breastfeeding?  No   BMI 34.81 kg/m  Gen: Well NAD, nontoxic appearing HEENT: EOMI,  MMM, PERRL Lungs: Normal work of breathing. CTABL Heart: RRR no MRG normal posterior pharynx Neck no meningismus Abd: NABS, Soft. Nondistended, Nontender Exts: Brisk capillary refill, warm and well perfused.   Results for orders placed or performed in visit on 04/11/16 (from the past 24 hour(s))  POCT CBC     Status: Abnormal   Collection Time: 04/11/16  9:49 AM  Result Value Ref Range   WBC 6.0 4.6 - 10.2 K/uL   Lymph, poc 2.0 0.6 - 3.4   POC LYMPH PERCENT 33.4 10 - 50 %L   MID (cbc) 0.3 0 - 0.9   POC MID % 5.5 0 - 12 %M   POC Granulocyte 3.7 2 - 6.9   Granulocyte percent 61.1 37 - 80 %G   RBC 4.27 4.04 - 5.48 M/uL   Hemoglobin 13.2 12.2 - 16.2 g/dL   HCT, POC 37.2 (A) 37.7 - 47.9 %   MCV  87.3 80 - 97 fL   MCH, POC 31.0 27 - 31.2 pg   MCHC 35.5 (A) 31.8 - 35.4 g/dL   RDW, POC 12.7 %   Platelet Count, POC 220 142 - 424 K/uL   MPV 7.4 0 - 99.8 fL   No results found.  Assessment and Plan: 55 y.o. female with headache and bodyaches with summertime illness. Possible exposure to tick borne illness. Plan to treat empirically with doxycycline. Prednisone as backup. If not improved will return for recheck and reevaluation.  Discussed warning signs or symptoms. Please see discharge instructions. Patient expresses understanding.

## 2016-08-13 ENCOUNTER — Ambulatory Visit: Payer: Self-pay | Admitting: Family Medicine

## 2016-08-17 ENCOUNTER — Ambulatory Visit (INDEPENDENT_AMBULATORY_CARE_PROVIDER_SITE_OTHER): Payer: Self-pay | Admitting: Family Medicine

## 2016-08-17 VITALS — BP 122/76 | HR 90 | Temp 98.2°F | Resp 18 | Ht 64.5 in | Wt 220.0 lb

## 2016-08-17 DIAGNOSIS — Z6837 Body mass index (BMI) 37.0-37.9, adult: Secondary | ICD-10-CM

## 2016-08-17 DIAGNOSIS — O99810 Abnormal glucose complicating pregnancy: Secondary | ICD-10-CM

## 2016-08-17 DIAGNOSIS — I10 Essential (primary) hypertension: Secondary | ICD-10-CM

## 2016-08-17 DIAGNOSIS — IMO0001 Reserved for inherently not codable concepts without codable children: Secondary | ICD-10-CM

## 2016-08-17 DIAGNOSIS — E6609 Other obesity due to excess calories: Secondary | ICD-10-CM

## 2016-08-17 DIAGNOSIS — R7303 Prediabetes: Secondary | ICD-10-CM

## 2016-08-17 LAB — POCT GLYCOSYLATED HEMOGLOBIN (HGB A1C): Hemoglobin A1C: 6

## 2016-08-17 MED ORDER — LABETALOL HCL 100 MG PO TABS: 100.0000 mg | ORAL_TABLET | Freq: Two times a day (BID) | ORAL | 3 refills | Status: DC

## 2016-08-17 MED ORDER — VALACYCLOVIR HCL 1 G PO TABS: 1000.0000 mg | ORAL_TABLET | Freq: Three times a day (TID) | ORAL | 3 refills | Status: DC

## 2016-08-17 MED ORDER — METFORMIN HCL 500 MG PO TABS: 500.0000 mg | ORAL_TABLET | Freq: Two times a day (BID) | ORAL | 5 refills | Status: DC

## 2016-08-17 MED ORDER — TOPIRAMATE 25 MG PO TABS: ORAL_TABLET | ORAL | 2 refills | Status: DC

## 2016-08-17 MED ORDER — ALBUTEROL SULFATE 108 (90 BASE) MCG/ACT IN AEPB: 2.0000 | INHALATION_SPRAY | RESPIRATORY_TRACT | 2 refills | Status: DC | PRN

## 2016-08-17 NOTE — Progress Notes (Signed)
Subjective:  By signing my name below, I, Raven Small, attest that this documentation has been prepared under the direction and in the presence of Delman Cheadle, MD.  Electronically Signed: Thea Alken, ED Scribe. 08/17/2016. 2:45 PM.    Patient ID: Sheila Nelson, female    DOB: Feb 04, 1961, 55 y.o.   MRN: [id number]  HPI   Chief Complaint  Patient presents with  . Establish Care    HPI Comments: Sheila Nelson is a 55 y.o. female who presents to the [facility] to establish care.   Pt is needing a refill of labetalol 100 mg once in the morning. States she was on this medication while she was pregnant. 3 months after her son was born and restarted medication 05/2015. Pt checks her BP outside of office a couple times a week. States she'll usually check it after developing a HA which typically occur in the morning. .  Pt states she's had difficulty getting her weight under control. States she'll lose 15 lbs but gain it back. She has tried weight watchers and a 30 day low carb diet in the past. She is considering a lap band. Pt is going through menopause which has made even more difficult for her to lose weight. Pt reports with weight gain, she has joint pain. She reports hx of gestation diabetes while pregnant with her son. She has a glucometer at home and will occasionally check blood sugars after breakfast which have been below 100.   Pt is needing a refill of valtrex. She recently had a cold sore outbreak.    Pt also complains of cough. She has not tried at home treatments. She report sick contacts at home. Pt needs refill of albuterol. She does not have insurance at this time.  Patient Active Problem List   Diagnosis Date Noted  . Chest pain 09/17/2015  . Essential hypertension   . GERD (gastroesophageal reflux disease)   . Obesity   . PVC's (premature ventricular contractions)   . HTN (hypertension)   . Chest pain on exertion 09/13/2015  . Postpartum care  following cesarean delivery 06/23/12) 06/24/2012  . Palpitations 03/08/2012  . Leg abscess 05/07/2011  . PLANTAR FASCIITIS, RIGHT 09/30/2010  . TINNITUS 10/09/2009  . HYPERTENSION 10/09/2009  . ALLERGIC RHINITIS, SEASONAL 10/09/2009  . REACTIVE AIRWAY DISEASE 10/09/2009  . ADVANCED MATERNAL AGE 38/05/2010   Past Medical History:  Diagnosis Date  . ASTHMA UNSPECIFIED WITH EXACERBATION   . Chest pain    a. 09/2015 felt to be non cardiac after LHC with no CAD   . GERD (gastroesophageal reflux disease)   . H/O varicella   . Herpes   . HTN (hypertension)   . Hx of viral meningitis   . Obesity   . Pancreatitis    Viral  . PLANTAR FASCIITIS, RIGHT 09/30/2010  . PVC's (premature ventricular contractions)   . REACTIVE AIRWAY DISEASE 10/09/2009  . TINNITUS 10/09/2009   Past Surgical History:  Procedure Laterality Date  . APPENDECTOMY    . CARDIAC CATHETERIZATION N/A 09/17/2015   Procedure: Left Heart Cath and Coronary Angiography;  Surgeon: Burnell Blanks, MD;  Location: [facility];  Service: Cardiovascular;  Laterality: N/A;  . CESAREAN SECTION  06/23/2012   Procedure: CESAREAN SECTION;  Surgeon: Lovenia Kim, MD;  Location: [facility];  Service: Obstetrics;  Laterality: N/A;  . DILATION AND CURETTAGE OF UTERUS    . INGUINAL HERNIA REPAIR     Infant, bilateral  .  MOUTH SURGERY    . Right thigh surgery  1973   Trauma   No Known Allergies Prior to Admission medications   Medication Sig Start Date End Date Taking? Authorizing Provider  ibuprofen (ADVIL,MOTRIN) 600 MG tablet Take 600 mg by mouth every 6 (six) hours as needed for fever.   Yes Historical Provider, MD  labetalol (NORMODYNE) 100 MG tablet Take 100 mg by mouth 2 (two) times daily.   Yes Historical Provider, MD  Multiple Vitamin (MULTIVITAMIN WITH MINERALS) TABS tablet Take 1 tablet by mouth daily.   Yes Historical Provider, MD  Omega 3 1000 MG CAPS Take 1,000 mg by mouth daily.   Yes Historical Provider, MD    TURMERIC PO Take 1 tablet by mouth daily. Reported on 10/20/2015   Yes Historical Provider, MD  valACYclovir (VALTREX) 1000 MG tablet Take 1,000 mg by mouth 3 (three) times daily as needed. Reported on 10/20/2015   Yes Historical Provider, MD   Social History   Social History  . Marital status: Married    Spouse name: N/A  . Number of children: 0  . Years of education: N/A   Occupational History  .  [employer]   Social History Main Topics  . Smoking status: Never Smoker  . Smokeless tobacco: Never Used     Comment: Lives with spouse s/p wedding 07/2010. Employed as RN-pediatric critical care; now [employer]  . Alcohol use Yes     Comment: wine with dinner every 2 weeks  . Drug use: No  . Sexual activity: Yes   Other Topics Concern  . 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     Social History   Social History  . Marital status: Married    Spouse name: N/A  . Number of children: 0  . Years of education: N/A   Occupational History  .  [employer]   Social History Main Topics  . Smoking status: Never Smoker  . Smokeless tobacco: Never Used     Comment: Lives with spouse s/p wedding 07/2010. Employed as RN-pediatric critical care; now [employer]  . Alcohol use Yes     Comment: wine with dinner every 2 weeks  . Drug use: No  . Sexual activity: Yes   Other Topics Concern  . 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      Review of Systems  Constitutional: Negative for chills and fever.  Respiratory: Positive for cough.   Genitourinary: Negative for difficulty urinating, dysuria, frequency, hematuria and urgency.  see hpi  Objective:   Physical Exam  Constitutional: She is oriented to person, place, and time. She appears well-developed and well-nourished. No distress.  HENT:  Head: Normocephalic and atraumatic.  Right Ear: External ear normal.  Left Ear: External ear normal.  Nose: Nose normal.  Mouth/Throat: Uvula  is midline, oropharynx is clear and moist and mucous membranes are normal.  Eyes: Conjunctivae and EOM are normal.  Neck: Neck supple.  Cardiovascular: Normal rate, regular rhythm, S1 normal, S2 normal and normal heart sounds.   Pulmonary/Chest: Effort normal and breath sounds normal. She has no rales.  Musculoskeletal: Normal range of motion.  Neurological: She is alert and oriented to person, place, and time.  Skin: Skin is warm and dry.  Psychiatric: She has a normal mood and affect. Her behavior is normal.  Nursing note and vitals reviewed.   Vitals:   08/17/16 1407  BP: 122/76  Pulse: 90  Resp: 18  Temp: 98.2 F (36.8 C)  TempSrc: Oral  SpO2: 95%  Weight: 220 lb (99.8 kg)  Height: 5' 4.5" (1.638 m)  Body mass index is 37.18 kg/m. Results for orders placed or performed in visit on 08/17/16  POCT glycosylated hemoglobin (Hb A1C)  Result Value Ref Range   Hemoglobin A1C 6.0     Assessment & Plan:   1. Essential hypertension - Increased labetalol from 100mg  once a day to twice a day. Was well controlled on labetalol during preg and has never changed since still breastfeeding though her son is largely weaned (comfort nurses at night -Laurann Montana is a 31 or 53 yo) so we will likely transition her to a more effective agent at some point.  2. Glucose intolerance of pregnancy   3. Class 2 obesity due to excess calories with serious comorbidity and body mass index (BMI) of 37.0 to 37.9 in adult   4. Prediabetes   Pt requests referral to nutritionist - rec [employer] with Princella Pellegrini RN Reviewed past hx weight loss attempts - have been intermittent - nothing with particular success and worsening since menopause. No formal programs, no prior trial of meds. Pt may be a good candidate for trial of weight loss med to kick off her journey though would need to avoid timulant due to HTN and others are limited by cost - consider trial of metformin, vytorin, wellbutrin, naltrexone, topamax (pt  prefers latter).  Orders Placed This Encounter  Procedures  . 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  Meds ordered this encounter  Medications  . DISCONTD: labetalol (NORMODYNE) 100 MG tablet    Sig: Take 100 mg by mouth 2 (two) times daily.  Marland Kitchen labetalol (NORMODYNE) 100 MG tablet    Sig: Take 1 tablet (100 mg total) by mouth 2 (two) times daily.    Dispense:  180 tablet    Refill:  3  . Albuterol Sulfate (PROAIR RESPICLICK) 123XX123 (90 Base) MCG/ACT AEPB    Sig: Inhale 2 puffs into the lungs every 4 (four) hours as needed.    Dispense:  1 each    Refill:  2  . topiramate (TOPAMAX) 25 MG tablet    Sig: Take 1 tab po qhs 1 wk then 1 tab po bid x 1wk, then 1 tab po qam and 2 tabs qhs x 1 wk, then 2 bid    Dispense:  120 tablet    Refill:  2  . valACYclovir (VALTREX) 1000 MG tablet    Sig: Take 1 tablet (1,000 mg total) by mouth 3 (three) times daily. As directed by physician    Dispense:  90 tablet    Refill:  3  . metFORMIN (GLUCOPHAGE) 500 MG tablet    Sig: Take 1 tablet (500 mg total) by mouth 2 (two) times daily with a meal.    Dispense:  60 tablet    Refill:  5    I personally performed the services described in this documentation, which was scribed in my presence. The recorded information has been reviewed and considered, and addended by me as needed.   Delman Cheadle, M.D.  [facility] [address] [place], [place] [address] [phone] phone [phone]  fax  09/02/16 12:48 AM

## 2016-08-17 NOTE — Patient Instructions (Addendum)
  Whenever you have time you should check out the [facility] bariatric seminar. They have a live version at [facility] a couple times a year or I think there is an online version. They normally want you to have viewed this prior to scheduling an in office visit. If you need a referral for this just let me know.   IF you received an x-ray today, you will receive an invoice from [facility]. Please contact [facility] at [phone] with questions or concerns regarding your invoice.   IF you received labwork today, you will receive an invoice from [facility]. Please contact [facility] at [phone] with questions or concerns regarding your invoice.   Our billing staff will not be able to assist you with questions regarding bills from these companies.  You will be contacted with the lab results as soon as they are available. The fastest way to get your results is to activate your My Chart account. Instructions are located on the last page of this paperwork. If you have not heard from [place] regarding the results in 2 weeks, please contact this office.

## 2016-10-05 ENCOUNTER — Emergency Department (HOSPITAL_COMMUNITY): Payer: BLUE CROSS/BLUE SHIELD

## 2016-10-05 ENCOUNTER — Observation Stay (HOSPITAL_COMMUNITY): Payer: BLUE CROSS/BLUE SHIELD | Admitting: Anesthesiology

## 2016-10-05 ENCOUNTER — Encounter (HOSPITAL_COMMUNITY): Payer: Self-pay

## 2016-10-05 ENCOUNTER — Ambulatory Visit (HOSPITAL_COMMUNITY)
Admission: EM | Admit: 2016-10-05 | Discharge: 2016-10-05 | Disposition: A | Discharge status: Home / Self Care | Payer: BLUE CROSS/BLUE SHIELD | Attending: Surgery | Admitting: Surgery

## 2016-10-05 ENCOUNTER — Encounter (HOSPITAL_COMMUNITY)
Admission: EM | Disposition: A | Discharge status: Home / Self Care | Payer: Self-pay | Source: Home / Self Care | Attending: Emergency Medicine

## 2016-10-05 DIAGNOSIS — Z8042 Family history of malignant neoplasm of prostate: Secondary | ICD-10-CM | POA: Insufficient documentation

## 2016-10-05 DIAGNOSIS — Z832 Family history of diseases of the blood and blood-forming organs and certain disorders involving the immune mechanism: Secondary | ICD-10-CM | POA: Diagnosis not present

## 2016-10-05 DIAGNOSIS — K8010 Calculus of gallbladder with chronic cholecystitis without obstruction: Secondary | ICD-10-CM | POA: Diagnosis present

## 2016-10-05 DIAGNOSIS — E669 Obesity, unspecified: Secondary | ICD-10-CM | POA: Diagnosis not present

## 2016-10-05 DIAGNOSIS — K8020 Calculus of gallbladder without cholecystitis without obstruction: Secondary | ICD-10-CM

## 2016-10-05 DIAGNOSIS — I493 Ventricular premature depolarization: Secondary | ICD-10-CM | POA: Diagnosis not present

## 2016-10-05 DIAGNOSIS — J45901 Unspecified asthma with (acute) exacerbation: Secondary | ICD-10-CM | POA: Diagnosis not present

## 2016-10-05 DIAGNOSIS — H9319 Tinnitus, unspecified ear: Secondary | ICD-10-CM | POA: Insufficient documentation

## 2016-10-05 DIAGNOSIS — Z823 Family history of stroke: Secondary | ICD-10-CM | POA: Insufficient documentation

## 2016-10-05 DIAGNOSIS — R1011 Right upper quadrant pain: Secondary | ICD-10-CM

## 2016-10-05 DIAGNOSIS — Z8249 Family history of ischemic heart disease and other diseases of the circulatory system: Secondary | ICD-10-CM | POA: Diagnosis not present

## 2016-10-05 DIAGNOSIS — K760 Fatty (change of) liver, not elsewhere classified: Secondary | ICD-10-CM | POA: Insufficient documentation

## 2016-10-05 DIAGNOSIS — I10 Essential (primary) hypertension: Secondary | ICD-10-CM | POA: Diagnosis not present

## 2016-10-05 DIAGNOSIS — Z8661 Personal history of infections of the central nervous system: Secondary | ICD-10-CM | POA: Diagnosis not present

## 2016-10-05 DIAGNOSIS — K219 Gastro-esophageal reflux disease without esophagitis: Secondary | ICD-10-CM | POA: Diagnosis not present

## 2016-10-05 DIAGNOSIS — R7303 Prediabetes: Secondary | ICD-10-CM | POA: Diagnosis not present

## 2016-10-05 HISTORY — PX: CHOLECYSTECTOMY: SHX55

## 2016-10-05 LAB — CBC
HCT: 36.2 % (ref 36.0–46.0)
Hemoglobin: 12.3 g/dL (ref 12.0–15.0)
MCH: 29.5 pg (ref 26.0–34.0)
MCHC: 34 g/dL (ref 30.0–36.0)
MCV: 86.8 fL (ref 78.0–100.0)
PLATELETS: 233 10*3/uL (ref 150–400)
RBC: 4.17 MIL/uL (ref 3.87–5.11)
RDW: 12.4 % (ref 11.5–15.5)
WBC: 5.8 10*3/uL (ref 4.0–10.5)

## 2016-10-05 LAB — COMPREHENSIVE METABOLIC PANEL
ALT: 22 U/L (ref 14–54)
AST: 24 U/L (ref 15–41)
Albumin: 4.5 g/dL (ref 3.5–5.0)
Alkaline Phosphatase: 88 U/L (ref 38–126)
Anion gap: 9 (ref 5–15)
BILIRUBIN TOTAL: 0.5 mg/dL (ref 0.3–1.2)
BUN: 20 mg/dL (ref 6–20)
CO2: 26 mmol/L (ref 22–32)
CREATININE: 0.87 mg/dL (ref 0.44–1.00)
Calcium: 9.3 mg/dL (ref 8.9–10.3)
Chloride: 102 mmol/L (ref 101–111)
GFR calc Af Amer: >60 mL/min (ref >60)
Glucose, Bld: 122 mg/dL — ABNORMAL HIGH (ref 65–99)
Potassium: 3.7 mmol/L (ref 3.5–5.1)
Sodium: 137 mmol/L (ref 135–145)
TOTAL PROTEIN: 7.2 g/dL (ref 6.5–8.1)

## 2016-10-05 LAB — URINALYSIS, ROUTINE W REFLEX MICROSCOPIC
Bilirubin Urine: NEGATIVE
GLUCOSE, UA: NEGATIVE mg/dL
Hgb urine dipstick: NEGATIVE
KETONES UR: NEGATIVE mg/dL
NITRITE: NEGATIVE
PH: 6 (ref 5.0–8.0)
Protein, ur: NEGATIVE mg/dL
SPECIFIC GRAVITY, URINE: 1.009 (ref 1.005–1.030)

## 2016-10-05 LAB — LIPASE, BLOOD: Lipase: 43 U/L (ref 11–51)

## 2016-10-05 SURGERY — LAPAROSCOPIC CHOLECYSTECTOMY WITH INTRAOPERATIVE CHOLANGIOGRAM
Anesthesia: General | Site: Abdomen

## 2016-10-05 MED ORDER — DEXAMETHASONE SODIUM PHOSPHATE 10 MG/ML IJ SOLN
INTRAMUSCULAR | Status: DC | PRN
Start: 2016-10-05 — End: 2016-10-05
  Administered 2016-10-05: 10 mg via INTRAVENOUS

## 2016-10-05 MED ORDER — FENTANYL CITRATE (PF) 100 MCG/2ML IJ SOLN
25.0000 ug | INTRAMUSCULAR | Status: DC | PRN
  Administered 2016-10-05: 50 ug via INTRAVENOUS

## 2016-10-05 MED ORDER — DEXTROSE 5 % IV SOLN
2.0000 g | INTRAVENOUS | Status: DC
  Administered 2016-10-05: 2 g via INTRAVENOUS
  Filled 2016-10-05: qty 2

## 2016-10-05 MED ORDER — BUPIVACAINE HCL (PF) 0.5 % IJ SOLN
INTRAMUSCULAR | Status: DC | PRN
  Administered 2016-10-05: 27 mL

## 2016-10-05 MED ORDER — ROCURONIUM BROMIDE 10 MG/ML (PF) SYRINGE
PREFILLED_SYRINGE | INTRAVENOUS | Status: DC | PRN
  Administered 2016-10-05: 40 mg via INTRAVENOUS

## 2016-10-05 MED ORDER — OXYCODONE HCL 5 MG PO TABS: 5.0000 mg | ORAL_TABLET | Freq: Once | ORAL | Status: DC | PRN

## 2016-10-05 MED ORDER — ONDANSETRON 4 MG PO TBDP: 4.0000 mg | ORAL_TABLET | Freq: Four times a day (QID) | ORAL | Status: DC | PRN

## 2016-10-05 MED ORDER — HYDROMORPHONE HCL 2 MG/ML IJ SOLN: 1.0000 mg | INTRAMUSCULAR | Status: DC | PRN

## 2016-10-05 MED ORDER — SUGAMMADEX SODIUM 200 MG/2ML IV SOLN
INTRAVENOUS | Status: DC | PRN
Start: 2016-10-05 — End: 2016-10-05
  Administered 2016-10-05: 200 mg via INTRAVENOUS

## 2016-10-05 MED ORDER — SUCCINYLCHOLINE CHLORIDE 200 MG/10ML IV SOSY
PREFILLED_SYRINGE | INTRAVENOUS | Status: DC | PRN
Start: 2016-10-05 — End: 2016-10-05
  Administered 2016-10-05: 100 mg via INTRAVENOUS

## 2016-10-05 MED ORDER — SODIUM CHLORIDE 0.9 % IV BOLUS (SEPSIS)
1000.0000 mL | Freq: Once | INTRAVENOUS | Status: AC
  Administered 2016-10-05: 1000 mL via INTRAVENOUS

## 2016-10-05 MED ORDER — HYDROMORPHONE HCL 1 MG/ML IJ SOLN: 1.0000 mg | INTRAMUSCULAR | Status: DC | PRN

## 2016-10-05 MED ORDER — MORPHINE SULFATE (PF) 4 MG/ML IV SOLN
4.0000 mg | Freq: Once | INTRAVENOUS | Status: AC
  Administered 2016-10-05: 4 mg via INTRAVENOUS
  Filled 2016-10-05: qty 1

## 2016-10-05 MED ORDER — SCOPOLAMINE 1 MG/3DAYS TD PT72
MEDICATED_PATCH | TRANSDERMAL | Status: AC
  Filled 2016-10-05: qty 1

## 2016-10-05 MED ORDER — LIDOCAINE 2% (20 MG/ML) 5 ML SYRINGE
INTRAMUSCULAR | Status: AC
  Filled 2016-10-05: qty 5

## 2016-10-05 MED ORDER — OXYCODONE HCL 5 MG/5ML PO SOLN
5.0000 mg | Freq: Once | ORAL | Status: DC | PRN
  Filled 2016-10-05: qty 5

## 2016-10-05 MED ORDER — PROPOFOL 10 MG/ML IV BOLUS
INTRAVENOUS | Status: DC | PRN
Start: 2016-10-05 — End: 2016-10-05
  Administered 2016-10-05: 150 mg via INTRAVENOUS

## 2016-10-05 MED ORDER — ENOXAPARIN SODIUM 40 MG/0.4ML ~~LOC~~ SOLN: 40.0000 mg | SUBCUTANEOUS | Status: DC

## 2016-10-05 MED ORDER — LIDOCAINE 2% (20 MG/ML) 5 ML SYRINGE
INTRAMUSCULAR | Status: DC | PRN
  Administered 2016-10-05: 100 mg via INTRAVENOUS

## 2016-10-05 MED ORDER — DEXAMETHASONE SODIUM PHOSPHATE 10 MG/ML IJ SOLN
INTRAMUSCULAR | Status: AC
  Filled 2016-10-05: qty 1

## 2016-10-05 MED ORDER — PROPOFOL 10 MG/ML IV BOLUS
INTRAVENOUS | Status: AC
  Filled 2016-10-05: qty 20

## 2016-10-05 MED ORDER — METOCLOPRAMIDE HCL 5 MG/ML IJ SOLN
INTRAMUSCULAR | Status: AC
  Filled 2016-10-05: qty 2

## 2016-10-05 MED ORDER — LACTATED RINGERS IR SOLN
Status: DC | PRN
  Administered 2016-10-05: 1000 mL

## 2016-10-05 MED ORDER — FENTANYL CITRATE (PF) 100 MCG/2ML IJ SOLN
INTRAMUSCULAR | Status: DC | PRN
  Administered 2016-10-05 (×3): 50 ug via INTRAVENOUS
  Administered 2016-10-05: 100 ug via INTRAVENOUS

## 2016-10-05 MED ORDER — SUCCINYLCHOLINE CHLORIDE 200 MG/10ML IV SOSY
PREFILLED_SYRINGE | INTRAVENOUS | Status: AC
  Filled 2016-10-05: qty 10

## 2016-10-05 MED ORDER — BUPIVACAINE HCL (PF) 0.5 % IJ SOLN
INTRAMUSCULAR | Status: AC
  Filled 2016-10-05: qty 30

## 2016-10-05 MED ORDER — ONDANSETRON HCL 4 MG/2ML IJ SOLN: 4.0000 mg | Freq: Once | INTRAMUSCULAR | Status: DC | PRN

## 2016-10-05 MED ORDER — METOCLOPRAMIDE HCL 5 MG/ML IJ SOLN
10.0000 mg | Freq: Once | INTRAMUSCULAR | Status: AC | PRN
  Administered 2016-10-05: 10 mg via INTRAVENOUS

## 2016-10-05 MED ORDER — ONDANSETRON 4 MG PO TBDP
4.0000 mg | ORAL_TABLET | Freq: Once | ORAL | Status: AC | PRN
  Administered 2016-10-05: 4 mg via ORAL
  Filled 2016-10-05: qty 1

## 2016-10-05 MED ORDER — SUGAMMADEX SODIUM 200 MG/2ML IV SOLN
INTRAVENOUS | Status: AC
  Filled 2016-10-05: qty 2

## 2016-10-05 MED ORDER — IOPAMIDOL (ISOVUE-300) INJECTION 61%
INTRAVENOUS | Status: AC
  Filled 2016-10-05: qty 50

## 2016-10-05 MED ORDER — ONDANSETRON HCL 4 MG/2ML IJ SOLN: 4.0000 mg | Freq: Four times a day (QID) | INTRAMUSCULAR | Status: DC | PRN

## 2016-10-05 MED ORDER — PANTOPRAZOLE SODIUM 40 MG IV SOLR: 40.0000 mg | Freq: Every day | INTRAVENOUS | Status: DC

## 2016-10-05 MED ORDER — FENTANYL CITRATE (PF) 100 MCG/2ML IJ SOLN
INTRAMUSCULAR | Status: AC
  Filled 2016-10-05: qty 2

## 2016-10-05 MED ORDER — ONDANSETRON HCL 4 MG/2ML IJ SOLN
INTRAMUSCULAR | Status: DC | PRN
  Administered 2016-10-05: 4 mg via INTRAVENOUS

## 2016-10-05 MED ORDER — KCL-LACTATED RINGERS-D5W 20 MEQ/L IV SOLN
INTRAVENOUS | Status: DC
  Administered 2016-10-05: 15:00:00 via INTRAVENOUS
  Filled 2016-10-05: qty 1000

## 2016-10-05 MED ORDER — MORPHINE SULFATE (PF) 4 MG/ML IV SOLN: 4.0000 mg | Freq: Once | INTRAVENOUS | Status: DC

## 2016-10-05 MED ORDER — FENTANYL CITRATE (PF) 250 MCG/5ML IJ SOLN
INTRAMUSCULAR | Status: AC
  Filled 2016-10-05: qty 5

## 2016-10-05 MED ORDER — HYDROCODONE-ACETAMINOPHEN 5-325 MG PO TABS: 1.0000 | ORAL_TABLET | Freq: Four times a day (QID) | ORAL | 0 refills | Status: DC | PRN

## 2016-10-05 MED ORDER — 0.9 % SODIUM CHLORIDE (POUR BTL) OPTIME
TOPICAL | Status: DC | PRN
  Administered 2016-10-05: 1000 mL

## 2016-10-05 MED ORDER — MIDAZOLAM HCL 2 MG/2ML IJ SOLN
INTRAMUSCULAR | Status: DC | PRN
  Administered 2016-10-05: 2 mg via INTRAVENOUS

## 2016-10-05 MED ORDER — ONDANSETRON HCL 4 MG/2ML IJ SOLN
4.0000 mg | Freq: Once | INTRAMUSCULAR | Status: AC
  Administered 2016-10-05: 4 mg via INTRAVENOUS
  Filled 2016-10-05: qty 2

## 2016-10-05 MED ORDER — LACTATED RINGERS IV SOLN
INTRAVENOUS | Status: DC
  Administered 2016-10-05: 125 mL/h via INTRAVENOUS
  Administered 2016-10-05: 1000 mL via INTRAVENOUS

## 2016-10-05 MED ORDER — MIDAZOLAM HCL 2 MG/2ML IJ SOLN
INTRAMUSCULAR | Status: AC
  Filled 2016-10-05: qty 2

## 2016-10-05 MED ORDER — ROCURONIUM BROMIDE 50 MG/5ML IV SOSY
PREFILLED_SYRINGE | INTRAVENOUS | Status: AC
  Filled 2016-10-05: qty 5

## 2016-10-05 MED ORDER — SCOPOLAMINE 1 MG/3DAYS TD PT72
MEDICATED_PATCH | TRANSDERMAL | Status: DC | PRN
  Administered 2016-10-05: 1 via TRANSDERMAL

## 2016-10-05 MED ORDER — ONDANSETRON HCL 4 MG/2ML IJ SOLN
INTRAMUSCULAR | Status: AC
  Filled 2016-10-05: qty 2

## 2016-10-05 MED ORDER — HYDROCODONE-ACETAMINOPHEN 5-325 MG PO TABS: 1.0000 | ORAL_TABLET | ORAL | Status: DC | PRN

## 2016-10-05 SURGICAL SUPPLY — 30 items
APPLIER CLIP ROT 10 11.4 M/L (STAPLE) ×2
CABLE HIGH FREQUENCY MONO STRZ (ELECTRODE) ×2 IMPLANT
CLIP APPLIE ROT 10 11.4 M/L (STAPLE) ×1 IMPLANT
COVER MAYO STAND STRL (DRAPES) IMPLANT
COVER SURGICAL LIGHT HANDLE (MISCELLANEOUS) ×2 IMPLANT
DECANTER SPIKE VIAL GLASS SM (MISCELLANEOUS) ×2 IMPLANT
DERMABOND ADVANCED (GAUZE/BANDAGES/DRESSINGS) ×1
DERMABOND ADVANCED .7 DNX12 (GAUZE/BANDAGES/DRESSINGS) ×1 IMPLANT
DRAPE C-ARM 42X120 X-RAY (DRAPES) IMPLANT
ELECT REM PT RETURN 9FT ADLT (ELECTROSURGICAL) ×2
ELECTRODE REM PT RTRN 9FT ADLT (ELECTROSURGICAL) ×1 IMPLANT
GLOVE EUDERMIC 7 POWDERFREE (GLOVE) ×2 IMPLANT
GOWN STRL REUS W/TWL XL LVL3 (GOWN DISPOSABLE) ×8 IMPLANT
HEMOSTAT SNOW SURGICEL 2X4 (HEMOSTASIS) IMPLANT
KIT BASIN OR (CUSTOM PROCEDURE TRAY) ×2 IMPLANT
POSITIONER SURGICAL ARM (MISCELLANEOUS) IMPLANT
POUCH SPECIMEN RETRIEVAL 10MM (ENDOMECHANICALS) ×2 IMPLANT
SCISSORS LAP 5X35 DISP (ENDOMECHANICALS) ×2 IMPLANT
SET CHOLANGIOGRAPH MIX (MISCELLANEOUS) IMPLANT
SET IRRIG TUBING LAPAROSCOPIC (IRRIGATION / IRRIGATOR) ×2 IMPLANT
SLEEVE XCEL OPT CAN 5 100 (ENDOMECHANICALS) ×2 IMPLANT
SUT MNCRL AB 4-0 PS2 18 (SUTURE) ×2 IMPLANT
TAPE CLOTH 4X10 WHT NS (GAUZE/BANDAGES/DRESSINGS) IMPLANT
TOWEL OR 17X26 10 PK STRL BLUE (TOWEL DISPOSABLE) ×2 IMPLANT
TOWEL OR NON WOVEN STRL DISP B (DISPOSABLE) ×2 IMPLANT
TRAY LAPAROSCOPIC (CUSTOM PROCEDURE TRAY) ×2 IMPLANT
TROCAR BLADELESS OPT 5 100 (ENDOMECHANICALS) ×2 IMPLANT
TROCAR XCEL BLUNT TIP 100MML (ENDOMECHANICALS) ×2 IMPLANT
TROCAR XCEL NON-BLD 11X100MML (ENDOMECHANICALS) ×2 IMPLANT
TUBING INSUF HEATED (TUBING) ×2 IMPLANT

## 2016-10-05 NOTE — ED Triage Notes (Signed)
Pt c/o sudden right upper quadrant abdominal pain since 0345 this morning. Dry heaves, nausea.

## 2016-10-05 NOTE — ED Notes (Signed)
U/s done

## 2016-10-05 NOTE — Op Note (Signed)
Patient Name:           Sheila Nelson   Date of Surgery:        10/05/2016  Pre op Diagnosis:      Cholecystitis with cholelithiasis  Post op Diagnosis:    Same  Procedure:                 Laparoscopic cholecystectomy  Surgeon:                     Edsel Petrin. Dalbert Batman, M.D., FACS  Assistant:                      OR staff   Indication for Assistant: n/a  Operative Indications:   This is a very pleasant 56 year old Caucasian female who is admitted for management of symptomatic gallstones.    She had an ultrasound in 2013 during a pregnancy and that showed gallstones but no other complicating features.  She cannot remember why the ultrasound was done.  For the past 3 weeks she's been having increasing reflux symptoms.  No pain.  She awoke at 4 AM this morning with moderately severe right upper quadrant pain, nausea, and dry heaves.  She had one episode of diarrhea 2 days ago.  Otherwise not much later GI symptoms.  She was given narcotics and she continues to have pain and we were called.       CBC, lipase, LFTs are normal.  Ultrasound shows borderline fatty infiltration of liver.  Gallstones with 1.5 cm gallstone in gallbladder neck region.  No sonographic evidence of acute inflammation.  CBD 3.8 mm.        Past history significant for sister and section 4 years ago.  Laparoscopic appendectomy many years ago.  Borderline diabetes.  Hypertension.  She had cardiac workup with echo and cardiac cath by Dr. Wyatt Haste in 2013 or so and that was completely negative.  Her PCP is Dr. Delman Cheadle.        The patient is still uncomfortable and tender and would like to come into the hospital and proceed with laparoscopic cholecystectomy.  Operative Findings:       The gallbladder was minimally inflamed.  There were no adhesions.  Slightly edematous.  The anatomy of the infundibulum, cystic duct, cystic artery, and common duct were conventional.  The cystic duct was very long and extremely tiny no more than 2 mm in  diameter.  The liver, stomach, duodenum, small intestine, large intestine were grossly normal to inspection.  Procedure in Detail:          Following the induction of general endotracheal anesthesia the patient's abdomen was prepped and draped in a sterile fashion.  Intravenous antibiotics were given.  Surgical timeout was performed.  0.5% Marcaine was used as local infiltration anesthetic.      A vertical incision was made in the lower rim of the umbilicus, through the old scar.  The fascia was incised in the midline and the abdominal cavity entered under direct vision.  An 11 mm Hassan trocar was inserted and secured with the Purstring suture of 0 Vicryl.  Pneumoperitoneum was created.  Video camera was inserted.  There is no evidence of injury or bleeding.  The trocar was placed in subxiphoid region to trochars placed in the right upper quadrant.      The gallbladder fundus was grasped and elevated.  I incised the peritoneum over the neck of the gallbladder and dissected out  the infundibulum and then retracted laterally.  I dissected out the cystic duct and cystic artery extensively until I had a very good critical view behind both structures.  I did not see any reason to do a cholangiogram.  The cystic duct and cystic artery were controlled with multiple medical clips and divided.  The gallbladder was dissected from its bed, placed in a specimen bag and removed.  The operative field was cauterized and irrigated.  At the completion of the case the irrigation fluid was completely clear and there is no bleeding or bile leak whatsoever.       The trochars were removed and there was no bleeding.  The pneumoperitoneum was released.  Fascia at the umbilicus was closed with 0 Vicryl sutures and the skin incisions closed with subcuticular sutures of 4-0 Monocryl and Dermabond.  The patient tolerated the procedure well was taken to PACU in stable condition.  EBL 15-20 mL.  Counts correct.  Complications  none.     Edsel Petrin. Dalbert Batman, M.D., FACS General and Minimally Invasive Surgery Breast and Colorectal Surgery  10/05/2016 11:37 AM

## 2016-10-05 NOTE — Anesthesia Procedure Notes (Signed)
Procedure Name: Intubation Date/Time: 10/05/2016 10:31 AM Performed by: Danley Danker L Patient Re-evaluated:Patient Re-evaluated prior to inductionOxygen Delivery Method: Circle system utilized Preoxygenation: Pre-oxygenation with 100% oxygen Intubation Type: IV induction and Rapid sequence Laryngoscope Size: Miller and 2 Grade View: Grade I Tube type: Oral Tube size: 7.5 mm Number of attempts: 1 Airway Equipment and Method: Stylet Placement Confirmation: ETT inserted through vocal cords under direct vision,  positive ETCO2 and breath sounds checked- equal and bilateral Secured at: 21 cm Tube secured with: Tape Dental Injury: Teeth and Oropharynx as per pre-operative assessment

## 2016-10-05 NOTE — H&P (Signed)
Sheila Nelson is an 55 y.o. female.   Chief Complaint: *RUQ pain  HPI: This is a very pleasant 56 year old Caucasian female who is admitted for management of symptomatic gallstones.    She had an ultrasound in 2013 during a pregnancy and that showed gallstones but no other complicating features.  She cannot remember why the ultrasound was done.  For the past 3 weeks she's been having increasing reflux symptoms.  No pain.  She awoke at 4 AM this morning with moderately severe right upper quadrant pain, nausea, and dry heaves.  She had one episode of diarrhea 2 days ago.  Otherwise not much later GI symptoms.  She was given narcotics and she continues to have pain and we were called.       CBC, lipase, LFTs are normal.  Ultrasound shows borderline fatty infiltration of liver.  Gallstones with 1.5 cm gallstone in gallbladder neck region.  No sonographic evidence of acute inflammation.  CBD 3.8 mm.        Past history significant for sister and section 4 years ago.  Laparoscopic appendectomy many years ago.  Borderline diabetes.  Hypertension.  She had cardiac workup with echo and cardiac cath by Dr. Wyatt Haste in 2013 or so and that was completely negative.  Her PCP is Dr. Delman Cheadle.        The patient is still uncomfortable and tender and would like to come into the hospital and proceed with laparoscopic cholecystectomy.       Past Medical History:  Diagnosis Date  . ASTHMA UNSPECIFIED WITH EXACERBATION   . Chest pain    a. 09/2015 felt to be non cardiac after LHC with no CAD   . GERD (gastroesophageal reflux disease)   . H/O varicella   . Herpes   . HTN (hypertension)   . Hx of viral meningitis   . Obesity   . Pancreatitis    Viral  . PLANTAR FASCIITIS, RIGHT 09/30/2010  . PVC's (premature ventricular contractions)   . REACTIVE AIRWAY DISEASE 10/09/2009  . TINNITUS 10/09/2009    Past Surgical History:  Procedure Laterality Date  . APPENDECTOMY    . CARDIAC CATHETERIZATION N/A 09/17/2015   Procedure: Left Heart Cath and Coronary Angiography;  Surgeon: Burnell Blanks, MD;  Location: [facility];  Service: Cardiovascular;  Laterality: N/A;  . CESAREAN SECTION  06/23/2012   Procedure: CESAREAN SECTION;  Surgeon: Lovenia Kim, MD;  Location: [facility];  Service: Obstetrics;  Laterality: N/A;  . DILATION AND CURETTAGE OF UTERUS    . INGUINAL HERNIA REPAIR     Infant, bilateral  . MOUTH SURGERY    . Right thigh surgery  1973   Trauma    Family History  Problem Relation Age of Onset  . Prostate cancer Father   . Stroke Father 58  . Heart failure Father   . Hypertension Father   . Cancer Father     prostate  . Valvular heart disease Mother     MVR  . Thrombocytopenia Mother   . Heart disease Mother   . Birth defects Sister     tetralogy of falot  . Cancer Brother     prostate  . Hypertension Brother   . Stroke Maternal Grandmother   . Hypertension Brother   . Hypertension Brother    Social History:  reports that she has never smoked. She has never used smokeless tobacco. She reports that she drinks alcohol. She reports that she does not use  drugs.  She is married.  She has a 79-year-old child.  Her husband is a Engineer, water.  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  Denies tobacco.  Takes alcohol occasionally.  Allergies: No Known Allergies   (Not in a hospital admission)  Results for orders placed or performed during the hospital encounter of 10/05/16 (from the past 48 hour(s))  Lipase, blood     Status: None   Collection Time: 10/05/16  6:10 AM  Result Value Ref Range   Lipase 43 11 - 51 U/L  Comprehensive metabolic panel     Status: Abnormal   Collection Time: 10/05/16  6:10 AM  Result Value Ref Range   Sodium 137 135 - 145 mmol/L   Potassium 3.7 3.5 - 5.1 mmol/L   Chloride 102 101 - 111 mmol/L   CO2 26 22 - 32 mmol/L   Glucose, Bld 122 (H) 65 - 99 mg/dL   BUN 20 6 - 20 mg/dL    Creatinine, Ser 0.87 0.44 - 1.00 mg/dL   Calcium 9.3 8.9 - 10.3 mg/dL   Total Protein 7.2 6.5 - 8.1 g/dL   Albumin 4.5 3.5 - 5.0 g/dL   AST 24 15 - 41 U/L   ALT 22 14 - 54 U/L   Alkaline Phosphatase 88 38 - 126 U/L   Total Bilirubin 0.5 0.3 - 1.2 mg/dL   GFR calc non Af Amer >60 >60 mL/min   GFR calc Af Amer >60 >60 mL/min    Comment: (NOTE) The eGFR has been calculated using the CKD EPI equation. This calculation has not been validated in all clinical situations. eGFR's persistently <60 mL/min signify possible Chronic Kidney Disease.    Anion gap 9 5 - 15  CBC     Status: None   Collection Time: 10/05/16  6:10 AM  Result Value Ref Range   WBC 5.8 4.0 - 10.5 K/uL   RBC 4.17 3.87 - 5.11 MIL/uL   Hemoglobin 12.3 12.0 - 15.0 g/dL   HCT 36.2 36.0 - 46.0 %   MCV 86.8 78.0 - 100.0 fL   MCH 29.5 26.0 - 34.0 pg   MCHC 34.0 30.0 - 36.0 g/dL   RDW 12.4 11.5 - 15.5 %   Platelets 233 150 - 400 K/uL  Urinalysis, Routine w reflex microscopic     Status: Abnormal   Collection Time: 10/05/16  6:25 AM  Result Value Ref Range   Color, Urine YELLOW YELLOW   APPearance HAZY (A) CLEAR   Specific Gravity, Urine 1.009 1.005 - 1.030   pH 6.0 5.0 - 8.0   Glucose, UA NEGATIVE NEGATIVE mg/dL   Hgb urine dipstick NEGATIVE NEGATIVE   Bilirubin Urine NEGATIVE NEGATIVE   Ketones, ur NEGATIVE NEGATIVE mg/dL   Protein, ur NEGATIVE NEGATIVE mg/dL   Nitrite NEGATIVE NEGATIVE   Leukocytes, UA LARGE (A) NEGATIVE   RBC / HPF 0-5 0 - 5 RBC/hpf   WBC, UA 6-30 0 - 5 WBC/hpf   Bacteria, UA RARE (A) NONE SEEN   Squamous Epithelial / LPF 6-30 (A) NONE SEEN   US Abdomen Limited Ruq  Result Date: 10/05/2016 CLINICAL DATA:  56 year old female with acute right upper quadrant pain. Hypertension. Initial encounter. EXAM: US ABDOMEN LIMITED - RIGHT UPPER QUADRANT COMPARISON:  01/21/2012 ultrasound. FINDINGS: Gallbladder: Gallstones largest gallbladder neck region measuring 1.5 cm. No gallbladder wall thickening or  pericholecystic fluid. Patient was not tender over this region during scanning per ultrasound technologist.  Common bile duct: Diameter: 3.8 mm. Distal aspect not visualized secondary to bowel gas. Liver: Mild increased echogenicity may represent fatty infiltration without focal mass or intrahepatic biliary duct dilation. IMPRESSION: Gallstones largest gallbladder neck region measuring 1.5 cm. No sonographic evidence of acute cholecystitis. Mild fatty infiltration of the liver suspected. Electronically Signed   By: Genia Del M.D.   On: 10/05/2016 07:16    ROS  12 system review of systems is performed and is negative except as described above  Blood pressure 111/67, pulse 65, temperature 97.8 F (36.6 C), temperature source Oral, resp. rate 18, last menstrual period 09/19/2011, SpO2 95 %.     Physical Exam  Gen.: Alert.  Pleasant.  Excellent insight into medical problems.  Minimal distress.  Stated weight 220. Eyes: Sclera clear.  EOMs intact Face: Nose, oropharynx, ears grossly normal to inspection Neck: Supple.  Nontender.  Good range of motion.  No thyromegaly.  No adenopathy. Lungs: Clear to auscultation bilaterally Heart: Regular rate and rhythm.  No murmur or ectopy.  Radial and femoral pulses palpable Abdomen: Soft and nondistended.  Right upper quadrant tenderness but no mass or peritonitis.  Well-healed infraumbilical scar from appendectomy.  Well-healed Pfannenstiel scar.  No palpable masses or hernias Extremities: Free range of motion.  No deformity.  No edema Neurologic: Alert and oriented 4.  No gross motor or sensory deficit.   Assessment:    Cholecystitis with cholelithiasis.  Pain has persisted despite narcotic will need intervention    History C-section    History laparoscopic appendectomy    Hypertension     Borderline diabetes     Negative cardiac catheterization and echocardiogram in past  Plan:     I had a long discussion with the patient and she has decided  she was to go ahead with cholecystectomy today     She will be scheduled for laparoscopic cholecystectomy with cholangiogram, possible open cholecystectomy      I discussed the indications, details, techniques, and numerous risk of the surgery with her.  She's aware of the risk of bleeding, infection, conversion to open laparotomy, injury to adjacent organs with major reconstructive surgery, bile leak, wound hernia, reoperation for complications, cardiac pulmonary and thromboembolic problems.  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  All of her questions are answered.  She understands all these issues.  She agrees with this plan.   Edsel Petrin. Dalbert Batman, M.D., [facility] General and Minimally invasive Surgery Breast and Colorectal Surgery Office:   [phone] Pager:   [phone]   Adin Hector, MD 10/05/2016, 9:14 AM

## 2016-10-05 NOTE — Anesthesia Postprocedure Evaluation (Signed)
Anesthesia Post Note  Patient: Sheila Nelson  Procedure(s) Performed: Procedure(s) (LRB): LAPAROSCOPIC CHOLECYSTECTOMY (N/A)  Patient location during evaluation: PACU Anesthesia Type: General Level of consciousness: awake, awake and alert and oriented Pain management: pain level controlled Vital Signs Assessment: post-procedure vital signs reviewed and stable Respiratory status: spontaneous breathing, nonlabored ventilation and respiratory function stable Cardiovascular status: blood pressure returned to baseline Anesthetic complications: no       Last Vitals:  Vitals:   10/05/16 1353 10/05/16 1455  BP: 138/70 (!) 123/57  Pulse: (!) 59 67  Resp: 14 16  Temp: 36.7 C 36.6 C    Last Pain:  Vitals:   10/05/16 1455  TempSrc: Oral  PainSc:                  Zoua Caporaso COKER

## 2016-10-05 NOTE — Transfer of Care (Signed)
Immediate Anesthesia Transfer of Care Note  Patient: Sheila Nelson  Procedure(s) Performed: Procedure(s): LAPAROSCOPIC CHOLECYSTECTOMY (N/A)  Patient Location: PACU  Anesthesia Type:General  Level of Consciousness: awake, alert  and oriented  Airway & Oxygen Therapy: Patient Spontanous Breathing and Patient connected to face mask oxygen  Post-op Assessment: Report given to RN and Post -op Vital signs reviewed and stable  Post vital signs: Reviewed and stable  Last Vitals:  Vitals:   10/05/16 0737 10/05/16 0937  BP: 111/67 140/92  Pulse: 65 (!) 56  Resp: 18 16  Temp: 36.6 C 36.4 C    Last Pain:  Vitals:   10/05/16 0937  TempSrc: Oral  PainSc:          Complications: No apparent anesthesia complications

## 2016-10-05 NOTE — Progress Notes (Signed)
Discharge instructions gone over with patient with all questions answered.

## 2016-10-05 NOTE — ED Notes (Signed)
PATIENT AWARE URINE SPECIMEN NEEDED.

## 2016-10-05 NOTE — ED Provider Notes (Signed)
[facility] DEPT Provider Note   CSN: [id number] Arrival date & time: 10/05/16  0448     History   Chief Complaint No chief complaint on file.   HPI Sheila Nelson is a 56 y.o. female.  HPI   56 year old female presents with concern of right upper quadrant abdominal pain beginning at 3:30 AM. Reports sharp right upper quadrant abdominal pain that radiates around to her back. Reports she had been diagnosed with gallstones in the past, however never had symptoms. Reports she had associated nausea and emesis which she describes as dry heaves. Denies any fevers, but did report chills. Denies urinary symptoms. Denies vaginal bleeding or discharge. Reports a very days ago which has resolved at this point. Reports over the last months she's had some nausea and occasional cramping abdominal pain which she had attributed to her metformin that she began for borderline hyperglycemia.  Pain is severe and constant. She works as a Marine scientist.   Past Medical History:  Diagnosis Date  . ASTHMA UNSPECIFIED WITH EXACERBATION   . Chest pain    a. 09/2015 felt to be non cardiac after LHC with no CAD   . GERD (gastroesophageal reflux disease)   . H/O varicella   . Herpes   . HTN (hypertension)   . Hx of viral meningitis   . Obesity   . Pancreatitis    Viral  . PLANTAR FASCIITIS, RIGHT 09/30/2010  . PVC's (premature ventricular contractions)   . REACTIVE AIRWAY DISEASE 10/09/2009  . TINNITUS 10/09/2009    Patient Active Problem List   Diagnosis Date Noted  . Cholecystitis with cholelithiasis 10/05/2016  . Chest pain 09/17/2015  . Essential hypertension   . GERD (gastroesophageal reflux disease)   . Obesity   . PVC's (premature ventricular contractions)   . HTN (hypertension)   . Chest pain on exertion 09/13/2015  . Postpartum care following cesarean delivery 06/23/12) 06/24/2012  . Palpitations 03/08/2012  . Leg abscess 05/07/2011  . PLANTAR FASCIITIS, RIGHT 09/30/2010  . TINNITUS 10/09/2009    . HYPERTENSION 10/09/2009  . ALLERGIC RHINITIS, SEASONAL 10/09/2009  . REACTIVE AIRWAY DISEASE 10/09/2009  . ADVANCED MATERNAL AGE 31/05/2010    Past Surgical History:  Procedure Laterality Date  . APPENDECTOMY    . CARDIAC CATHETERIZATION N/A 09/17/2015   Procedure: Left Heart Cath and Coronary Angiography;  Surgeon: Burnell Blanks, MD;  Location: [facility];  Service: Cardiovascular;  Laterality: N/A;  . CESAREAN SECTION  06/23/2012   Procedure: CESAREAN SECTION;  Surgeon: Lovenia Kim, MD;  Location: [facility];  Service: Obstetrics;  Laterality: N/A;  . DILATION AND CURETTAGE OF UTERUS    . INGUINAL HERNIA REPAIR     Infant, bilateral  . MOUTH SURGERY    . Right thigh surgery  1973   Trauma    OB History    Gravida Para Term Preterm AB Living   2 1 1  0 1 1   SAB TAB Ectopic Multiple Live Births   0 1 0 0 1       Home Medications    Prior to Admission medications   Medication Sig Start Date End Date Taking? Authorizing Provider  acetaminophen (TYLENOL) 325 MG tablet Take 650 mg by mouth every 6 (six) hours as needed for moderate pain.   Yes Historical Provider, MD  Albuterol Sulfate (PROAIR RESPICLICK) 123XX123 (90 Base) MCG/ACT AEPB Inhale 2 puffs into the lungs every 4 (four) hours as needed. 08/17/16  Yes Shawnee Knapp, MD  ibuprofen (  ADVIL,MOTRIN) 600 MG tablet Take 600 mg by mouth every 6 (six) hours as needed for fever.   Yes Historical Provider, MD  labetalol (NORMODYNE) 100 MG tablet Take 1 tablet (100 mg total) by mouth 2 (two) times daily. 08/17/16  Yes Shawnee Knapp, MD  metFORMIN (GLUCOPHAGE) 500 MG tablet Take 1 tablet (500 mg total) by mouth 2 (two) times daily with a meal. 08/17/16  Yes Shawnee Knapp, MD  Multiple Vitamin (MULTIVITAMIN WITH MINERALS) TABS tablet Take 1 tablet by mouth daily.   Yes Historical Provider, MD  Omega 3 1000 MG CAPS Take 1,000 mg by mouth daily.   Yes Historical Provider, MD  TURMERIC PO Take 1 tablet by mouth daily. Reported on  10/20/2015   Yes Historical Provider, MD  valACYclovir (VALTREX) 1000 MG tablet Take 1 tablet (1,000 mg total) by mouth 3 (three) times daily. As directed by physician Patient taking differently: Take 1,000 mg by mouth 3 (three) times daily as needed (outbreak). As directed by physician 08/17/16  Yes Shawnee Knapp, MD  topiramate (TOPAMAX) 25 MG tablet Take 1 tab po qhs 1 wk then 1 tab po bid x 1wk, then 1 tab po qam and 2 tabs qhs x 1 wk, then 2 bid Patient not taking: Reported on 10/05/2016 08/17/16   Shawnee Knapp, MD    Family History Family History  Problem Relation Age of Onset  . Prostate cancer Father   . Stroke Father 34  . Heart failure Father   . Hypertension Father   . Cancer Father     prostate  . Valvular heart disease Mother     MVR  . Thrombocytopenia Mother   . Heart disease Mother   . Birth defects Sister     tetralogy of falot  . Cancer Brother     prostate  . Hypertension Brother   . Stroke Maternal Grandmother   . Hypertension Brother   . Hypertension Brother     Social History Social History  Substance Use Topics  . Smoking status: Never Smoker  . Smokeless tobacco: Never Used     Comment: Lives with spouse s/p wedding 07/2010. Employed as RN-pediatric critical care; now [employer]  . Alcohol use Yes     Comment: wine with dinner every 2 weeks     Allergies   Patient has no known allergies.   Review of Systems Review of Systems  Constitutional: Negative for fever.  HENT: Negative for sore throat.   Eyes: Negative for visual disturbance.  Respiratory: Negative for cough and shortness of breath.   Cardiovascular: Negative for chest pain.  Gastrointestinal: Positive for abdominal pain, nausea and vomiting. Diarrhea: a few days ago, now resolved.  Genitourinary: Negative for difficulty urinating, dysuria and frequency.  Musculoskeletal: Positive for back pain (radiates to back). Negative for neck pain.  Skin: Negative for rash.  Neurological: Negative for  syncope and headaches.     Physical Exam Updated Vital Signs BP 111/67 (BP Location: Right Arm)   Pulse 65   Temp 97.8 F (36.6 C) (Oral)   Resp 18   LMP 09/19/2011   SpO2 95%   Physical Exam  Constitutional: She is oriented to person, place, and time. She appears well-developed and well-nourished. No distress.  HENT:  Head: Normocephalic and atraumatic.  Eyes: Conjunctivae and EOM are normal.  Neck: Normal range of motion.  Cardiovascular: Normal rate, regular rhythm, normal heart sounds and intact distal pulses.  Exam reveals no gallop and no friction  rub.   No murmur heard. Pulmonary/Chest: Effort normal and breath sounds normal. No respiratory distress. She has no wheezes. She has no rales.  Abdominal: Soft. She exhibits no distension. There is tenderness in the right upper quadrant. There is positive Murphy's sign. There is no guarding. CVA tenderness: mild right sided.  Musculoskeletal: She exhibits no edema or tenderness.  Neurological: She is alert and oriented to person, place, and time.  Skin: Skin is warm and dry. No rash noted. She is not diaphoretic. No erythema.  Nursing note and vitals reviewed.    ED Treatments / Results  Labs (all labs ordered are listed, but only abnormal results are displayed) Labs Reviewed  COMPREHENSIVE METABOLIC PANEL - Abnormal; Notable for the following:       Result Value   Glucose, Bld 122 (*)    All other components within normal limits  URINALYSIS, ROUTINE W REFLEX MICROSCOPIC - Abnormal; Notable for the following:    APPearance HAZY (*)    Leukocytes, UA LARGE (*)    Bacteria, UA RARE (*)    Squamous Epithelial / LPF 6-30 (*)    All other components within normal limits  LIPASE, BLOOD  CBC    EKG  EKG Interpretation None       Radiology US Abdomen Limited Ruq  Result Date: 10/05/2016 CLINICAL DATA:  56 year old female with acute right upper quadrant pain. Hypertension. Initial encounter. EXAM: US ABDOMEN LIMITED -  RIGHT UPPER QUADRANT COMPARISON:  01/21/2012 ultrasound. FINDINGS: Gallbladder: Gallstones largest gallbladder neck region measuring 1.5 cm. No gallbladder wall thickening or pericholecystic fluid. Patient was not tender over this region during scanning per ultrasound technologist. Common bile duct: Diameter: 3.8 mm. Distal aspect not visualized secondary to bowel gas. Liver: Mild increased echogenicity may represent fatty infiltration without focal mass or intrahepatic biliary duct dilation. IMPRESSION: Gallstones largest gallbladder neck region measuring 1.5 cm. No sonographic evidence of acute cholecystitis. Mild fatty infiltration of the liver suspected. Electronically Signed   By: Genia Del M.D.   On: 10/05/2016 07:16    Procedures Procedures (including critical care time)  Medications Ordered in ED Medications  lactated ringers infusion (not administered)  cefTRIAXone (ROCEPHIN) 2 g in dextrose 5 % 50 mL IVPB (not administered)  HYDROmorphone (DILAUDID) injection 1 mg (not administered)  ondansetron (ZOFRAN-ODT) disintegrating tablet 4 mg (not administered)    Or  ondansetron (ZOFRAN) injection 4 mg (not administered)  pantoprazole (PROTONIX) injection 40 mg (not administered)  morphine 4 MG/ML injection 4 mg (not administered)  ondansetron (ZOFRAN-ODT) disintegrating tablet 4 mg (4 mg Oral Given 10/05/16 0528)  morphine 4 MG/ML injection 4 mg (4 mg Intravenous Given 10/05/16 0634)  ondansetron (ZOFRAN) injection 4 mg (4 mg Intravenous Given 10/05/16 0631)  sodium chloride 0.9 % bolus 1,000 mL (0 mLs Intravenous Stopped 10/05/16 0713)     Initial Impression / Assessment and Plan / ED Course  I have reviewed the triage vital signs and the nursing notes.  Pertinent labs & imaging results that were available during my care of the patient were reviewed by me and considered in my medical decision making (see chart for details).     56 year old female with a history of hypertension,  appendectomy presents with concern for right upper quadrant abdominal pain beginning last night. Exam concerning for possible cholecystitis. Labs within normal limits. Urinalysis appears contaminated and pt without urinary symptoms and have lower suspicion for UTI.  Right upper quadrant ultrasound shows a 1.5 cm gallstone at the neck of  the gallbladder. Patient reports some mild improvement with morphine and Zofran, however continues to have pain beginning in her right upper quadrant radiating towards her back. Discussed with general surgery, given size and location of gallstone with persistent symptoms and they will come to bedside to evaluate the patient.   Final Clinical Impressions(s) / ED Diagnoses   Final diagnoses:  Right upper quadrant pain  Symptomatic cholelithiasis    New Prescriptions New Prescriptions   No medications on file     Gareth Morgan, MD 10/05/16 [phone]

## 2016-10-05 NOTE — Discharge Instructions (Signed)
[facility] ______[facility] °LAPAROSCOPIC SURGERY: POST OP INSTRUCTIONS °Always review your discharge instruction sheet given to you by the facility where your surgery was performed. °IF YOU HAVE DISABILITY OR FAMILY LEAVE FORMS, YOU MUST BRING THEM TO THE OFFICE FOR PROCESSING.   °DO NOT GIVE THEM TO YOUR DOCTOR. ° °1. A prescription for pain medication may be given to you upon discharge.  Take your pain medication as prescribed, if needed.  If narcotic pain medicine is not needed, then you may take acetaminophen (Tylenol) or ibuprofen (Advil) as needed. °2. Take your usually prescribed medications unless otherwise directed. °3. If you need a refill on your pain medication, please contact your pharmacy.  They will contact our office to request authorization. Prescriptions will not be filled after 5pm or on week-ends. °4. You should follow a light diet the first few days after arrival home, such as soup and crackers, etc.  Be sure to include lots of fluids daily. °5. Most patients will experience some swelling and bruising in the area of the incisions.  Ice packs will help.  Swelling and bruising can take several days to resolve.  °6. It is common to experience some constipation if taking pain medication after surgery.  Increasing fluid intake and taking a stool softener (such as Colace) will usually help or prevent this problem from occurring.  A mild laxative (Milk of Magnesia or Miralax) should be taken according to package instructions if there are no bowel movements after 48 hours. °7. Unless discharge instructions indicate otherwise, you may remove your bandages 24-48 hours after surgery, and you may shower at that time.  You may have steri-strips (small skin tapes) in place directly over the incision.  These strips should be left on the skin for 7-10 days.  If your surgeon used skin glue on the incision, you may shower in 24 hours.  The glue will flake off over the next 2-3 weeks.  Any sutures or  staples will be removed at the office during your follow-up visit. °8. ACTIVITIES:  You may resume regular (light) daily activities beginning the next day--such as daily self-care, walking, climbing stairs--gradually increasing activities as tolerated.  You may have sexual intercourse when it is comfortable.  Refrain from any heavy lifting or straining until approved by your doctor. °a. You may drive when you are no longer taking prescription pain medication, you can comfortably wear a seatbelt, and you can safely maneuver your car and apply brakes. °b. RETURN TO WORK:  __________________________________________________________ °9. You should see your doctor in the office for a follow-up appointment approximately 2-3 weeks after your surgery.  Make sure that you call for this appointment within a day or two after you arrive home to insure a convenient appointment time. °10. OTHER INSTRUCTIONS: __________________________________________________________________________________________________________________________ __________________________________________________________________________________________________________________________ °WHEN TO CALL YOUR DOCTOR: °1. Fever over 101.0 °2. Inability to urinate °3. Continued bleeding from incision. °4. Increased pain, redness, or drainage from the incision. °5. Increasing abdominal pain ° °The clinic staff is available to answer your questions during regular business hours.  Please don’t hesitate to call and ask to speak to one of the nurses for clinical concerns.  If you have a medical emergency, go to the nearest emergency room or call 911.  A surgeon from [facility] is always on call at the hospital. °[address], [address], [place], [place]  [address] ? [address] [address], [place], [place]   [address] °[phone] ? [phone] ? FAX [phone] °Web site:   [email or web address] °

## 2016-10-05 NOTE — Anesthesia Preprocedure Evaluation (Signed)
Anesthesia Evaluation  Patient identified by MRN, date of birth, ID band Patient awake    Reviewed: Allergy & Precautions, NPO status , Patient's Chart, lab work & pertinent test results, Unable to perform ROS - Chart review only  Airway Mallampati: II  TM Distance: >3 FB Neck ROM: Full    Dental  (+) Teeth Intact, Dental Advisory Given   Pulmonary    breath sounds clear to auscultation       Cardiovascular hypertension,  Rhythm:Regular Rate:Normal     Neuro/Psych    GI/Hepatic   Endo/Other    Renal/GU      Musculoskeletal   Abdominal   Peds  Hematology   Anesthesia Other Findings   Reproductive/Obstetrics                             Anesthesia Physical Anesthesia Plan  ASA: III  Anesthesia Plan: General   Post-op Pain Management:    Induction: Intravenous  Airway Management Planned: Oral ETT  Additional Equipment:   Intra-op Plan:   Post-operative Plan: Extubation in OR  Informed Consent: I have reviewed the patients History and Physical, chart, labs and discussed the procedure including the risks, benefits and alternatives for the proposed anesthesia with the patient or authorized representative who has indicated his/her understanding and acceptance.   Dental advisory given  Plan Discussed with:   Anesthesia Plan Comments:         Anesthesia Quick Evaluation

## 2016-12-18 ENCOUNTER — Telehealth: Payer: Self-pay | Admitting: Family Medicine

## 2016-12-18 NOTE — Telephone Encounter (Addendum)
Pt interested in changing BP med from labetolol now that she is no longer breast feeding. She has only been taking labetalol 100 qam as bid made her feel orthostatic

## 2016-12-19 ENCOUNTER — Other Ambulatory Visit: Payer: Self-pay | Admitting: Family Medicine

## 2016-12-19 MED ORDER — AMLODIPINE BESYLATE 5 MG PO TABS: 5.0000 mg | ORAL_TABLET | Freq: Every day | ORAL | 3 refills | Status: DC

## 2016-12-19 NOTE — Telephone Encounter (Signed)
Pt states she had a cough with lisinopril but would like to try amlodipine which I think is an excellent choice. Start amlodipine 5mg  qd, d/c labetalol. Monitor BP at home 2-3x/w and adjust amlodipine dose as needed to maintain good bp control 110-120s/70-80s.

## 2017-02-17 DIAGNOSIS — D2262 Melanocytic nevi of left upper limb, including shoulder: Secondary | ICD-10-CM | POA: Diagnosis not present

## 2017-02-17 DIAGNOSIS — L814 Other melanin hyperpigmentation: Secondary | ICD-10-CM | POA: Diagnosis not present

## 2017-02-17 DIAGNOSIS — D225 Melanocytic nevi of trunk: Secondary | ICD-10-CM | POA: Diagnosis not present

## 2017-02-17 DIAGNOSIS — L821 Other seborrheic keratosis: Secondary | ICD-10-CM | POA: Diagnosis not present

## 2017-03-16 ENCOUNTER — Ambulatory Visit (INDEPENDENT_AMBULATORY_CARE_PROVIDER_SITE_OTHER): Payer: BLUE CROSS/BLUE SHIELD

## 2017-03-16 ENCOUNTER — Encounter: Payer: Self-pay | Admitting: Physician Assistant

## 2017-03-16 ENCOUNTER — Ambulatory Visit (INDEPENDENT_AMBULATORY_CARE_PROVIDER_SITE_OTHER): Payer: BLUE CROSS/BLUE SHIELD | Admitting: Physician Assistant

## 2017-03-16 VITALS — BP 130/83 | HR 69 | Temp 97.6°F | Resp 16 | Ht 64.5 in | Wt 219.0 lb

## 2017-03-16 DIAGNOSIS — R110 Nausea: Secondary | ICD-10-CM | POA: Diagnosis not present

## 2017-03-16 DIAGNOSIS — R197 Diarrhea, unspecified: Secondary | ICD-10-CM

## 2017-03-16 DIAGNOSIS — R140 Abdominal distension (gaseous): Secondary | ICD-10-CM | POA: Diagnosis not present

## 2017-03-16 DIAGNOSIS — R1084 Generalized abdominal pain: Secondary | ICD-10-CM

## 2017-03-16 LAB — POCT CBC
Granulocyte percent: 55 %G (ref 37–80)
HCT, POC: 39.7 % (ref 37.7–47.9)
Hemoglobin: 13.6 g/dL (ref 12.2–16.2)
Lymph, poc: 2.1 (ref 0.6–3.4)
MCH, POC: 30.8 pg (ref 27–31.2)
MCHC: 34.3 g/dL (ref 31.8–35.4)
MCV: 89.8 fL (ref 80–97)
MID (cbc): 0.4 (ref 0–0.9)
MPV: 7.4 fL (ref 0–99.8)
POC Granulocyte: 3 (ref 2–6.9)
POC LYMPH PERCENT: 38.1 % (ref 10–50)
POC MID %: 6.9 % (ref 0–12)
Platelet Count, POC: 267 10*3/uL (ref 142–424)
RBC: 4.42 M/uL (ref 4.04–5.48)
RDW, POC: 12.5 %
WBC: 5.4 10*3/uL (ref 4.6–10.2)

## 2017-03-16 MED ORDER — ONDANSETRON HCL 8 MG PO TABS: 8.0000 mg | ORAL_TABLET | Freq: Three times a day (TID) | ORAL | 0 refills | Status: DC | PRN

## 2017-03-16 MED FILL — ONDANSETRON HCL 8 MG TABLET: 8 | 8 days supply | Qty: 20 | Fill #0

## 2017-03-16 NOTE — Patient Instructions (Addendum)
You will receive a phone call to schedule an appt with GI.  Bring your stool culture back when complete.  Con't eating BRATY diet. Drink plenty of fluids.  Consider starting Metformin XR (extended release) as this as fewer abdominal side effects. Come back when you are feeling better and we can start this.   Thank you for coming in today. I hope you feel we met your needs.  Feel free to call [facility] if you have any questions or further requests.  Please consider signing up for MyChart if you do not already have it, as this is a great way to communicate with me.  Best,  Whitney McVey, PA-C    IF you received an x-ray today, you will receive an invoice from [facility]. Please contact [facility] at [phone] with questions or concerns regarding your invoice.   IF you received labwork today, you will receive an invoice from [facility]. Please contact [facility] at [phone] with questions or concerns regarding your invoice.   Our billing staff will not be able to assist you with questions regarding bills from these companies.  You will be contacted with the lab results as soon as they are available. The fastest way to get your results is to activate your My Chart account. Instructions are located on the last page of this paperwork. If you have not heard from [place] regarding the results in 2 weeks, please contact this office.

## 2017-03-16 NOTE — Progress Notes (Signed)
 Sheila Nelson  MRN: [id number] DOB: 10/17/1960  PCP: Shaw, Eva N, MD  Subjective:  Pt is a pleasant 56 year old female PMH HTN, PVC's, GERD, cholecystitis s/p cholecystectomy who presents to clinic for nausea and diarrhea x 4 days.     Symptoms started with nausea, c/o reflux. Endorses abdominal pain that starts in her epigastric region and moves generalized then to left lower quadrant. She endorses bloating, nausea and diarrhea. Bright yellow watery stools. She is having >4 diarrhea/day. Last episode of diarrhea was this morning. Denies blood in stool, vomiting, chills.  One episode of fever 99.5 about 3 days ago with muscle aches.  She has been on BRATY diet - not helping much.  She works at [employer]. No recent travel.  Cholecystectomy 10/2016 She has h/o abdominal pain and diarrhea due to Metformin. Last dose Metformin was three days ago. She says this pain feels worse than Metformin diarrhea.  Review of Systems  Constitutional: Positive for fever. Negative for chills.  Respiratory: Negative for cough and shortness of breath.   Gastrointestinal: Positive for abdominal distention, abdominal pain, diarrhea and nausea. Negative for blood in stool.  Musculoskeletal: Positive for arthralgias.  Skin: Negative for rash.  Neurological: Negative for dizziness, weakness, light-headedness and headaches.  Psychiatric/Behavioral: Negative for confusion.    Patient Active Problem List   Diagnosis Date Noted  . Cholecystitis with cholelithiasis 10/05/2016  . Chest pain 09/17/2015  . Essential hypertension   . GERD (gastroesophageal reflux disease)   . Obesity   . PVC's (premature ventricular contractions)   . HTN (hypertension)   . Chest pain on exertion 09/13/2015  . Postpartum care following cesarean delivery 06/23/12) 06/24/2012  . Palpitations 03/08/2012  . Leg abscess 05/07/2011  . PLANTAR FASCIITIS, RIGHT 09/30/2010  . TINNITUS 10/09/2009  . HYPERTENSION 10/09/2009  .  ALLERGIC RHINITIS, SEASONAL 10/09/2009  . REACTIVE AIRWAY DISEASE 10/09/2009  . ADVANCED MATERNAL AGE 10/09/2009    Current Outpatient Prescriptions on File Prior to Visit  Medication Sig Dispense Refill  . amLODipine (NORVASC) 5 MG tablet Take 1 tablet (5 mg total) by mouth daily. 90 tablet 3  . ibuprofen (ADVIL,MOTRIN) 600 MG tablet Take 600 mg by mouth every 6 (six) hours as needed for fever.    . acetaminophen (TYLENOL) 325 MG tablet Take 650 mg by mouth every 6 (six) hours as needed for moderate pain.    . Albuterol Sulfate (PROAIR RESPICLICK) 108 (90 Base) MCG/ACT AEPB Inhale 2 puffs into the lungs every 4 (four) hours as needed. (Patient not taking: Reported on 03/16/2017) 1 each 2  . HYDROcodone-acetaminophen (NORCO) 5-325 MG tablet Take 1-2 tablets by mouth every 6 (six) hours as needed for moderate pain or severe pain. (Patient not taking: Reported on 03/16/2017) 30 tablet 0  . metFORMIN (GLUCOPHAGE) 500 MG tablet Take 1 tablet (500 mg total) by mouth 2 (two) times daily with a meal. (Patient not taking: Reported on 03/16/2017) 60 tablet 5  . Multiple Vitamin (MULTIVITAMIN WITH MINERALS) TABS tablet Take 1 tablet by mouth daily.    . Omega 3 1000 MG CAPS Take 1,000 mg by mouth daily.    . TURMERIC PO Take 1 tablet by mouth daily. Reported on 10/20/2015    . valACYclovir (VALTREX) 1000 MG tablet Take 1 tablet (1,000 mg total) by mouth 3 (three) times daily. As directed by physician (Patient not taking: Reported on 03/16/2017) 90 tablet 3  . [DISCONTINUED] beclomethasone (QVAR) 80 MCG/ACT inhaler Inhale 2 puffs into the lungs   2 (two) times daily. 1 Inhaler 0   No current facility-administered medications on file prior to visit.     No Known Allergies   Objective:  BP 130/83   Pulse 69   Temp 97.6 F (36.4 C) (Oral)   Resp 16   Ht 5' 4.5" (1.638 m)   Wt 219 lb (99.3 kg)   LMP 09/19/2011   SpO2 98%   BMI 37.01 kg/m   Physical Exam  Constitutional: She is oriented to person,  place, and time and well-developed, well-nourished, and in no distress. Vital signs are normal. She appears to not be writhing in pain.  Non-toxic appearance. She does not have a sickly appearance. No distress.  Cardiovascular: Normal rate, regular rhythm and normal heart sounds.   Abdominal: Soft. Normal appearance and bowel sounds are normal. She exhibits no distension. There is generalized tenderness. There is no rigidity, no rebound and no guarding.  Neurological: She is alert and oriented to person, place, and time. GCS score is 15.  Skin: Skin is warm and dry.  Psychiatric: Mood, memory, affect and judgment normal.  Vitals reviewed.   Dg Abd 2 Views  Result Date: 03/16/2017 CLINICAL DATA:  Abdominal distention for several days with diarrhea EXAM: ABDOMEN - 2 VIEW COMPARISON:  None. FINDINGS: Scattered large and small bowel gas is noted. No abnormal mass or abnormal calcifications are seen. No obstructive changes are noted. No free air is noted. Changes of prior cholecystectomy are seen. No acute bony abnormality is noted. IMPRESSION: No acute abnormality seen. Electronically Signed   By: Mark  Lukens M.D.   On: 03/16/2017 14:27   Results for orders placed or performed in visit on 03/16/17  POCT CBC  Result Value Ref Range   WBC 5.4 4.6 - 10.2 K/uL   Lymph, poc 2.1 0.6 - 3.4   POC LYMPH PERCENT 38.1 10 - 50 %L   MID (cbc) 0.4 0 - 0.9   POC MID % 6.9 0 - 12 %M   POC Granulocyte 3.0 2 - 6.9   Granulocyte percent 55.0 37 - 80 %G   RBC 4.42 4.04 - 5.48 M/uL   Hemoglobin 13.6 12.2 - 16.2 g/dL   HCT, POC 39.7 37.7 - 47.9 %   MCV 89.8 80 - 97 fL   MCH, POC 30.8 27 - 31.2 pg   MCHC 34.3 31.8 - 35.4 g/dL   RDW, POC 12.5 %   Platelet Count, POC 267 142 - 424 K/uL   MPV 7.4 0 - 99.8 fL    Assessment and Plan :  1. Generalized abdominal pain 2. Nausea without vomiting - ondansetron (ZOFRAN) 8 MG tablet; Take 1 tablet (8 mg total) by mouth every 8 (eight) hours as needed for nausea or  vomiting.  Dispense: 20 tablet; Refill: 0 - POCT CBC - Ambulatory referral to Gastroenterology - CMP14+EGFR - Lipase - Amylase 3. Diarrhea, unspecified type - Clostridium Difficile by PCR - Stool culture - DG Abd 2 Views; Future - Ambulatory referral to Gastroenterology - Pt does not appear acutely ill, vital signs are wnl. X-ray is negative and CBC is wnl. Suspect possible gastroenteritis vs Metformin side effect vs postcholecystectomy syndrome. Stool cultures are pending. Advised con't bland diet and fluids. Referral for GI to r/o postcholecystectomy syndrome as pt is s/p cholecystectomy. She agrees with plan. Labs are pending. Advised pt to consider Metformin XR when she is better.    Whitney McVey, PA-C  [facility] 03/16/2017   1:55 PM  

## 2017-03-17 LAB — CMP14+EGFR
ALT: 27 IU/L (ref 0–32)
AST: 28 IU/L (ref 0–40)
Albumin/Globulin Ratio: 1.7 (ref 1.2–2.2)
Albumin: 4.5 g/dL (ref 3.5–5.5)
Alkaline Phosphatase: 97 IU/L (ref 39–117)
BUN/Creatinine Ratio: 17 (ref 9–23)
BUN: 15 mg/dL (ref 6–24)
Bilirubin Total: 0.6 mg/dL (ref 0.0–1.2)
CO2: 21 mmol/L (ref 20–29)
Calcium: 9.3 mg/dL (ref 8.7–10.2)
Chloride: 101 mmol/L (ref 96–106)
Creatinine, Ser: 0.9 mg/dL (ref 0.57–1.00)
GFR calc Af Amer: 84 mL/min/{1.73_m2} (ref >59)
GFR calc non Af Amer: 73 mL/min/{1.73_m2} (ref >59)
Globulin, Total: 2.6 g/dL (ref 1.5–4.5)
Glucose: 143 mg/dL — ABNORMAL HIGH (ref 65–99)
Potassium: 4 mmol/L (ref 3.5–5.2)
Sodium: 140 mmol/L (ref 134–144)
Total Protein: 7.1 g/dL (ref 6.0–8.5)

## 2017-03-17 LAB — AMYLASE: Amylase: 46 U/L (ref 31–124)

## 2017-03-17 LAB — LIPASE: Lipase: 80 U/L — ABNORMAL HIGH (ref 14–72)

## 2017-03-17 NOTE — Progress Notes (Signed)
Please call pt and let her know her kidneys and liver levels look good. No concern for pancreatitis.  Be sure to come back when she is feeling better for diabetes check.  Thank you!

## 2017-03-24 ENCOUNTER — Ambulatory Visit: Payer: Self-pay | Admitting: Family Medicine

## 2017-03-26 ENCOUNTER — Ambulatory Visit (INDEPENDENT_AMBULATORY_CARE_PROVIDER_SITE_OTHER): Payer: BLUE CROSS/BLUE SHIELD | Admitting: Physician Assistant

## 2017-03-26 ENCOUNTER — Other Ambulatory Visit (INDEPENDENT_AMBULATORY_CARE_PROVIDER_SITE_OTHER): Payer: BLUE CROSS/BLUE SHIELD

## 2017-03-26 ENCOUNTER — Encounter: Payer: Self-pay | Admitting: Physician Assistant

## 2017-03-26 VITALS — BP 120/70 | HR 78 | Ht 64.0 in | Wt 216.0 lb

## 2017-03-26 DIAGNOSIS — K30 Functional dyspepsia: Secondary | ICD-10-CM | POA: Diagnosis not present

## 2017-03-26 DIAGNOSIS — R1084 Generalized abdominal pain: Secondary | ICD-10-CM | POA: Diagnosis not present

## 2017-03-26 DIAGNOSIS — R194 Change in bowel habit: Secondary | ICD-10-CM

## 2017-03-26 LAB — COMPREHENSIVE METABOLIC PANEL
ALT: 28 U/L (ref 0–35)
AST: 23 U/L (ref 0–37)
Albumin: 4.7 g/dL (ref 3.5–5.2)
Alkaline Phosphatase: 75 U/L (ref 39–117)
BUN: 17 mg/dL (ref 6–23)
CO2: 29 mEq/L (ref 19–32)
Calcium: 10.2 mg/dL (ref 8.4–10.5)
Chloride: 103 mEq/L (ref 96–112)
Creatinine, Ser: 0.88 mg/dL (ref 0.40–1.20)
GFR: 71.26 mL/min (ref >60.00)
Glucose, Bld: 107 mg/dL — ABNORMAL HIGH (ref 70–99)
Potassium: 3.7 mEq/L (ref 3.5–5.1)
Sodium: 140 mEq/L (ref 135–145)
Total Bilirubin: 1.1 mg/dL (ref 0.2–1.2)
Total Protein: 7.6 g/dL (ref 6.0–8.3)

## 2017-03-26 LAB — AMYLASE: Amylase: 45 U/L (ref 27–131)

## 2017-03-26 LAB — LIPASE: Lipase: 39 U/L (ref 11.0–59.0)

## 2017-03-26 MED ORDER — FAMOTIDINE 20 MG PO TABS: 20.0000 mg | ORAL_TABLET | Freq: Two times a day (BID) | ORAL | 0 refills | Status: DC

## 2017-03-26 NOTE — Progress Notes (Signed)
Initial assessment and plans reviewed 

## 2017-03-26 NOTE — Progress Notes (Signed)
Subjective:    Patient ID: Sheila Nelson, female    DOB: Sep 19, 1960, 56 y.o.   MRN: [id number]  Sheila Nelson is a very nice 56 year old white female, new to GI today referred by Dr. Shea Stakes McVeigh PA-C for evaluation of severe abdominal pain. She does have history of GERD, hypertension, obesity, remote appendectomy and underwent a laparoscopic cholecystectomy for symptomatic cholelithiasis in February 2018 per Dr. Dalbert Batman. She had ultrasound at that time showing gallstones and a 1.5 cm gallstone in the neck of the gallbladder, CBD was 3.8 mm. She did not have IOC. Recent labs done on 03/16/2016 showed a lipase of 80 CBC and LFTs were within normal limits. Patient states that she started taking metformin in December 2017 and tolerated fairly well for 3 or 4 months other than fatigue. She stopped taking it in the spring prior to having her gallbladder removed. She resumed it in March or April and then started noticing some alternating constipation and "explosive" bowel movements with forceful loose stool. She also had some abdominal cramping since stopped the metformin again. She feels that she had a gastroenteritis about 2 weeks ago with acute onset of nausea vomiting severe abdominal cramping and profuse diarrhea with about 25 episodes of loose to watery stool. That lasted for several days and then resolved. He said time she has been constipated and having a lot of straining noticing some mucus with her stools. She is also developed an increase in indigestion burping and belching generalized upper abdominal discomfort and some pain in her left lower quadrant. She says she's been drinking tons of water taking fiber supplements but still was not able to have a good bowel movement.   Review of Systems Pertinent positive and negative review of systems were noted in the above HPI section.  All other review of systems was otherwise negative.  Outpatient Encounter Prescriptions as of 03/26/2017    Medication Sig  . amLODipine (NORVASC) 5 MG tablet Take 1 tablet (5 mg total) by mouth daily.  Marland Kitchen ibuprofen (ADVIL,MOTRIN) 600 MG tablet Take 600 mg by mouth every 6 (six) hours as needed for fever.  . metFORMIN (GLUCOPHAGE) 500 MG tablet Take 1 tablet (500 mg total) by mouth 2 (two) times daily with a meal.  . MULTIPLE VITAMIN PO Take 1 tablet by mouth daily.  . Omega-3 Fatty Acids (OMEGA 3 PO) Take 2 capsules by mouth daily.  . ondansetron (ZOFRAN) 8 MG tablet Take 1 tablet (8 mg total) by mouth every 8 (eight) hours as needed for nausea or vomiting.  Marland Kitchen OVER THE COUNTER MEDICATION Fiber supplement capsules: 3-5 grams/day  . famotidine (PEPCID) 20 MG tablet Take 1 tablet (20 mg total) by mouth 2 (two) times daily.   No facility-administered encounter medications on file as of 03/26/2017.    No Known Allergies Patient Active Problem List   Diagnosis Date Noted  . Cholecystitis with cholelithiasis 10/05/2016  . Essential hypertension   . GERD (gastroesophageal reflux disease)   . Obesity   . PVC's (premature ventricular contractions)   . HTN (hypertension)   . PLANTAR FASCIITIS, RIGHT 09/30/2010  . ALLERGIC RHINITIS, SEASONAL 10/09/2009   Social History   Social History  . Marital status: Married    Spouse name: N/A  . Number of children: 1  . Years of education: N/A   Occupational History  . RN [employer]   Social History Main Topics  . Smoking status: Never Smoker  . Smokeless tobacco: Never Used  Comment: Lives with spouse s/p wedding 07/2010. Employed as RN-pediatric critical care; now [employer]  . Alcohol use Yes     Comment: wine with dinner every 2 weeks  . Drug use: No  . Sexual activity: Yes    Partners: Male   Other Topics Concern  . 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      Ms. Vrba family history includes Birth defects in her sister; Cancer in her father; Heart disease in her mother; Heart failure in her father;  Hypertension in her brother, brother, brother, and father; Prostate cancer in her brother and father; Stroke in her maternal grandmother; Stroke (age of onset: 41) in her father; Thrombocytopenia in her mother; Valvular heart disease in her mother.      Objective:    Vitals:   03/26/17 0910  BP: 120/70  Pulse: 78    Physical Exam  well-developed white female in no acute distress, very pleasant blood pressure 120/70 pulse 78, height 5 foot 4, weight 216, BMI 37.0. HEENT; nontraumatic, cephalic EOMI PERRLA sclera anicteric, Cardiovascular ;regular rate and rhythm with S1-S2 no murmur or gallop, Pulmonary ;clear bilaterally, Abdomen; obese, rather diffusely tender especially across the upper abdomen and in the left lower quadrant there is some guarding no rebound bowel sounds are present no palpable mass or hepatosplenomegaly, Rectal ;exam not done, Extremities; no clubbing cyanosis or edema skin warm and dry, Neuropsych; mood and affect appropriate       Assessment & Plan:   #25 56 year old white female status post laparoscopic cholecystectomy February 2018 #2 change in bowel habits with alternating constipation  and more explosive loose stool possibly medication-induced i.e. metformin   #3 acute onset 2-3 weeks ago with nausea vomiting diarrhea and abdominal cramping now with persistent abdominal discomfort new heartburn and indigestion, bloating and constipation. Etiology of current symptoms is not clear suspect she had had an acute gastroenteritis however she is quite tender rather diffusely on exam.  #4 colon cancer surveillance-no prior colonoscopy #5 status post remote appendectomy  Plan; repeat labs today to include CBC, chemistries amylase and lipase Start MiraLAX 17 g in 8 ounces of water daily Continue to push fluids Start Pepcid 20 mg by mouth twice a day 1 month We'll schedule for CT of the abdomen and pelvis with contrast, rule out pancreatitis, or other intra-abdominal  inflammatory process With acute symptoms have been sorted out she will also need to be scheduled for colonoscopy with Dr. Henrene Pastor.  Amy S Esterwood PA-C 03/26/2017   Cc: Shawnee Knapp, MD

## 2017-03-26 NOTE — Patient Instructions (Addendum)
Please go to the basement level to have your labs drawn.   Take Pepcid 20 mg, tke 1 tab twice daily.  Take Miralax, 17 grams in 8 oz of water daily.   You have been scheduled for a CT scan of the abdomen and pelvis at [facility] ([address]---this is in the same building as Press [facility]).   You are scheduled on Wednesday 8-1 at 3:00 PM. You should arrive at 2:45 PM to your appointment time for registration. Please follow the written instructions below on the day of your exam:  WARNING: IF YOU ARE ALLERGIC TO IODINE/X-RAY DYE, PLEASE NOTIFY RADIOLOGY IMMEDIATELY AT [phone]! YOU WILL BE GIVEN A 13 HOUR PREMEDICATION PREP.  1) Do not eat  anything after 11:00 am (4 hours prior to your test) 2) You have been given 2 bottles of oral contrast to drink. The solution may taste               better if refrigerated, but do NOT add ice or any other liquid to this solution. Shake             well before drinking.    Drink 1 bottle of contrast @ 1:00 PM (2 hours prior to your exam)  Drink 1 bottle of contrast @ 2:00 PM (1 hour prior to your exam)  You may take any medications as prescribed with a small amount of water except for the following: Metformin, Glucophage, Glucovance, Avandamet, Riomet, Fortamet, Actoplus Met, Janumet, Glumetza or Metaglip. The above medications must be held the day of the exam AND 48 hours after the exam.  The purpose of you drinking the oral contrast is to aid in the visualization of your intestinal tract. The contrast solution may cause some diarrhea. Before your exam is started, you will be given a small amount of fluid to drink. Depending on your individual set of symptoms, you may also receive an intravenous injection of x-ray contrast/dye. Plan on being at [facility] for 30 minutes or long, depending on the type of exam you are having performed.  If you have any questions regarding your exam or if you need to reschedule, you may call  the CT department at [phone] between the hours of 8:00 am and 5:00 pm, Monday-Friday.  ________________________________________________________________________

## 2017-03-30 ENCOUNTER — Telehealth: Payer: Self-pay | Admitting: Physician Assistant

## 2017-03-30 NOTE — Telephone Encounter (Signed)
Okay to cancel

## 2017-03-30 NOTE — Telephone Encounter (Signed)
Patient would like to cancel CT scheduled for tomorrow 8.1.18. Patient states that she is feeling a lot better and since being on the medication everything is moving smoothly. Will call back if problems reoccur.

## 2017-03-31 ENCOUNTER — Inpatient Hospital Stay: Admission: RE | Admit: 2017-03-31 | Payer: BLUE CROSS/BLUE SHIELD | Source: Ambulatory Visit

## 2017-03-31 NOTE — Telephone Encounter (Signed)
Glad she is feeling better. Ok to cancel CT . She does need to go ahead and get scheduled for screening Colonoscopy - may want to get scheduled as slots are getting hard to come by in next couple months

## 2017-04-01 NOTE — Telephone Encounter (Signed)
DPR on file. Left a message to call and schedule screening colonoscopy soon.

## 2017-04-09 ENCOUNTER — Telehealth: Payer: Self-pay | Admitting: Physician Assistant

## 2017-04-09 NOTE — Telephone Encounter (Signed)
Pt calling to give an update on her condition. States she is no longer having abdominal pain and her bowel movements have returned back to normal. States she is taking pepcid BID but is still having bile-tasting reflux. Reports having a bitter taste in her mouth most of the time. Please advise.

## 2017-04-10 NOTE — Telephone Encounter (Signed)
Ok - she needs to have a colonoscopy -Dr Henrene Pastor , and if pepecid not controlling reflux sxs - can switch to protonix 40 mg po qam  # 30 /2 refills - glad  bowels are back to normal

## 2017-04-12 MED ORDER — PANTOPRAZOLE SODIUM 40 MG PO TBEC: 40.0000 mg | DELAYED_RELEASE_TABLET | Freq: Every day | ORAL | 2 refills | Status: DC

## 2017-04-12 MED FILL — PANTOPRAZOLE SOD DR 40 MG T: 40 | 30 days supply | Qty: 30 | Fill #0

## 2017-04-12 NOTE — Telephone Encounter (Signed)
Spoke with the patient. She states she is aware she needs this and she will call us when she is ready to schedule.

## 2017-04-12 NOTE — Telephone Encounter (Signed)
Patient notified of recommendations She wants to call back to schedule the colonoscopy, she understands to call back when she has her schedule RX sent

## 2017-04-13 ENCOUNTER — Encounter: Payer: Self-pay | Admitting: Internal Medicine

## 2017-04-22 ENCOUNTER — Other Ambulatory Visit: Payer: Self-pay | Admitting: Physician Assistant

## 2017-05-20 DIAGNOSIS — M25572 Pain in left ankle and joints of left foot: Secondary | ICD-10-CM | POA: Diagnosis not present

## 2017-05-20 DIAGNOSIS — M25561 Pain in right knee: Secondary | ICD-10-CM | POA: Diagnosis not present

## 2017-05-31 DIAGNOSIS — M25572 Pain in left ankle and joints of left foot: Secondary | ICD-10-CM | POA: Diagnosis not present

## 2017-06-01 ENCOUNTER — Other Ambulatory Visit: Payer: Self-pay | Admitting: Family Medicine

## 2017-06-01 ENCOUNTER — Ambulatory Visit: Payer: BLUE CROSS/BLUE SHIELD | Admitting: Urgent Care

## 2017-06-01 DIAGNOSIS — M25579 Pain in unspecified ankle and joints of unspecified foot: Secondary | ICD-10-CM

## 2017-06-01 DIAGNOSIS — R7303 Prediabetes: Secondary | ICD-10-CM

## 2017-06-02 LAB — HEMOGLOBIN A1C
ESTIMATED AVERAGE GLUCOSE: 123 mg/dL
Hgb A1c MFr Bld: 5.9 % — ABNORMAL HIGH (ref 4.8–5.6)

## 2017-06-02 LAB — URIC ACID: Uric Acid: 6.5 mg/dL (ref 2.5–7.1)

## 2017-06-02 LAB — SEDIMENTATION RATE: Sed Rate: 17 mm/hr (ref 0–40)

## 2017-06-03 DIAGNOSIS — M79672 Pain in left foot: Secondary | ICD-10-CM | POA: Diagnosis not present

## 2017-06-04 ENCOUNTER — Ambulatory Visit (AMBULATORY_SURGERY_CENTER): Payer: Self-pay | Admitting: *Deleted

## 2017-06-04 VITALS — Ht 64.0 in | Wt 209.4 lb

## 2017-06-04 DIAGNOSIS — Z1211 Encounter for screening for malignant neoplasm of colon: Secondary | ICD-10-CM

## 2017-06-04 MED ORDER — NA SULFATE-K SULFATE-MG SULF 17.5-3.13-1.6 GM/177ML PO SOLN: ORAL | 0 refills | Status: DC

## 2017-06-04 NOTE — Progress Notes (Signed)
No allergies to eggs or soy. No problems with anesthesia.  Pt given Emmi instructions for colonoscopy  No oxygen use  No diet drug use  

## 2017-06-07 ENCOUNTER — Encounter: Payer: Self-pay | Admitting: Internal Medicine

## 2017-06-10 DIAGNOSIS — F4322 Adjustment disorder with anxiety: Secondary | ICD-10-CM | POA: Diagnosis not present

## 2017-06-18 ENCOUNTER — Ambulatory Visit (AMBULATORY_SURGERY_CENTER): Payer: BLUE CROSS/BLUE SHIELD | Admitting: Internal Medicine

## 2017-06-18 ENCOUNTER — Encounter: Payer: Self-pay | Admitting: Internal Medicine

## 2017-06-18 VITALS — BP 125/76 | HR 57 | Temp 98.0°F | Resp 16 | Ht 64.0 in | Wt 209.0 lb

## 2017-06-18 DIAGNOSIS — D122 Benign neoplasm of ascending colon: Secondary | ICD-10-CM

## 2017-06-18 DIAGNOSIS — Z1211 Encounter for screening for malignant neoplasm of colon: Secondary | ICD-10-CM

## 2017-06-18 DIAGNOSIS — Z1212 Encounter for screening for malignant neoplasm of rectum: Secondary | ICD-10-CM | POA: Diagnosis not present

## 2017-06-18 MED ORDER — SODIUM CHLORIDE 0.9 % IV SOLN: 500.0000 mL | INTRAVENOUS | Status: DC

## 2017-06-18 NOTE — Progress Notes (Signed)
To recovery, report to RN, VSS. 

## 2017-06-18 NOTE — Op Note (Signed)
[facility] Patient Name: Sheila Nelson Procedure Date: 06/18/2017 12:42 PM MRN: [id number] Endoscopist: Docia Chuck. Henrene Pastor , MD Age: 56 Referring MD:  Date of Birth: [birth year] Gender: Female Account #: [id number] Procedure:                Colonoscopy, with cold snare polypectomy x 1 Indications:              Screening for colorectal malignant neoplasm Medicines:                Monitored Anesthesia Care Procedure:                Pre-Anesthesia Assessment:                           - Prior to the procedure, a History and Physical                            was performed, and patient medications and                            allergies were reviewed. The patient's tolerance of                            previous anesthesia was also reviewed. The risks                            and benefits of the procedure and the sedation                            options and risks were discussed with the patient.                            All questions were answered, and informed consent                            was obtained. Prior Anticoagulants: The patient has                            taken no previous anticoagulant or antiplatelet                            agents. ASA Grade Assessment: II - A patient with                            mild systemic disease. After reviewing the risks                            and benefits, the patient was deemed in                            satisfactory condition to undergo the procedure.                           After obtaining informed consent, the colonoscope  was passed under direct vision. Throughout the                            procedure, the patient's blood pressure, pulse, and                            oxygen saturations were monitored continuously. The                            Colonoscope was introduced through the anus and                            advanced to the the cecum, identified by            appendiceal orifice and ileocecal valve. The                            ileocecal valve, appendiceal orifice, and rectum                            were photographed. The quality of the bowel                            preparation was excellent. The colonoscopy was                            performed without difficulty. The patient tolerated                            the procedure well. The bowel preparation used was                            SUPREP. Scope In: 1:34:55 PM Scope Out: 1:55:10 PM Scope Withdrawal Time: 0 hours 14 minutes 43 seconds  Total Procedure Duration: 0 hours 20 minutes 15 seconds  Findings:                 A 3 mm polyp was found in the ascending colon. The                            polyp was removed with a cold snare. Resection and                            retrieval were complete.                           Scattered small-mouthed diverticula were found in                            the entire colon.                           The exam was otherwise without abnormality on                            direct  and retroflexion views. Complications:            No immediate complications. Estimated blood loss:                            None. Estimated Blood Loss:     Estimated blood loss: none. Impression:               - One 3 mm polyp in the ascending colon, removed                            with a cold snare. Resected and retrieved.                           - Diverticulosis in the entire examined colon.                           - The examination was otherwise normal on direct                            and retroflexion views. Recommendation:           - Repeat colonoscopy in 5-10 years for surveillance.                           - Patient has a contact number available for                            emergencies. The signs and symptoms of potential                            delayed complications were discussed with the                             patient. Return to normal activities tomorrow.                            Written discharge instructions were provided to the                            patient.                           - Resume previous diet.                           - Continue present medications.                           - Await pathology results. Docia Chuck. Henrene Pastor, MD 06/18/2017 2:03:18 PM This report has been signed electronically.

## 2017-06-18 NOTE — Progress Notes (Signed)
Called to room to assist during endoscopic procedure.  Patient ID and intended procedure confirmed with present staff. Received instructions for my participation in the procedure from the performing physician.  

## 2017-06-18 NOTE — Progress Notes (Signed)
No changes in medical or surgical hx since PV per pt 

## 2017-06-18 NOTE — Patient Instructions (Signed)
YOU HAD AN ENDOSCOPIC PROCEDURE TODAY AT [facility]:   Refer to the procedure report that was given to you for any specific questions about what was found during the examination.  If the procedure report does not answer your questions, please call your gastroenterologist to clarify.  If you requested that your care partner not be given the details of your procedure findings, then the procedure report has been included in a sealed envelope for you to review at your convenience later.  YOU SHOULD EXPECT: Some feelings of bloating in the abdomen. Passage of more gas than usual.  Walking can help get rid of the air that was put into your GI tract during the procedure and reduce the bloating. If you had a lower endoscopy (such as a colonoscopy or flexible sigmoidoscopy) you may notice spotting of blood in your stool or on the toilet paper. If you underwent a bowel prep for your procedure, you may not have a normal bowel movement for a few days.  Please Note:  You might notice some irritation and congestion in your nose or some drainage.  This is from the oxygen used during your procedure.  There is no need for concern and it should clear up in a day or so.  SYMPTOMS TO REPORT IMMEDIATELY:   Following lower endoscopy (colonoscopy or flexible sigmoidoscopy):  Excessive amounts of blood in the stool  Significant tenderness or worsening of abdominal pains  Swelling of the abdomen that is new, acute  Fever of 100F or higher  For urgent or emergent issues, a gastroenterologist can be reached at any hour by calling [phone].   DIET:  We do recommend a small meal at first, but then you may proceed to your regular diet.  Drink plenty of fluids but you should avoid alcoholic beverages for 24 hours.  ACTIVITY:  You should plan to take it easy for the rest of today and you should NOT DRIVE or use heavy machinery until tomorrow (because of the sedation medicines used during the test).     FOLLOW UP: Our staff will call the number listed on your records the next business day following your procedure to check on you and address any questions or concerns that you may have regarding the information given to you following your procedure. If we do not reach you, we will leave a message.  However, if you are feeling well and you are not experiencing any problems, there is no need to return our call.  We will assume that you have returned to your regular daily activities without incident.  If any biopsies were taken you will be contacted by phone or by letter within the next 1-3 weeks.  Please call us at [phone] if you have not heard about the biopsies in 3 weeks.   Await for biopsy results to determine next Colonoscopy screening Polyps (handout given) Diverticulosis (handout given)   SIGNATURES/CONFIDENTIALITY: You and/or your care partner have signed paperwork which will be entered into your electronic medical record.  These signatures attest to the fact that that the information above on your After Visit Summary has been reviewed and is understood.  Full responsibility of the confidentiality of this discharge information lies with you and/or your care-partner.

## 2017-06-21 ENCOUNTER — Telehealth: Payer: Self-pay | Admitting: *Deleted

## 2017-06-21 ENCOUNTER — Telehealth: Payer: Self-pay

## 2017-06-21 NOTE — Telephone Encounter (Signed)
2 Follow up Call-  Call Jini Horiuchi number 06/18/2017  Post procedure Call Caidon Foti phone  # [phone]  Permission to leave phone message Yes  Some recent data might be hidden     Patient questions:  Do you have a fever, pain , or abdominal swelling? No. Pain Score  0 *  Have you tolerated food without any problems? Yes.    Have you been able to return to your normal activities? Yes.    Do you have any questions about your discharge instructions: Diet   No. Medications  No. Follow up visit  No.  Do you have questions or concerns about your Care? No.  Actions: * If pain score is 4 or above: No action needed, pain <4.

## 2017-06-21 NOTE — Telephone Encounter (Signed)
No answer, message left for the patient. 

## 2017-06-29 ENCOUNTER — Encounter: Payer: Self-pay | Admitting: Internal Medicine

## 2017-07-21 ENCOUNTER — Ambulatory Visit (INDEPENDENT_AMBULATORY_CARE_PROVIDER_SITE_OTHER): Payer: BLUE CROSS/BLUE SHIELD | Admitting: Family Medicine

## 2017-07-21 ENCOUNTER — Encounter: Payer: Self-pay | Admitting: Family Medicine

## 2017-07-21 ENCOUNTER — Other Ambulatory Visit: Payer: Self-pay

## 2017-07-21 VITALS — BP 116/88 | HR 87 | Temp 98.2°F | Resp 16 | Ht 64.0 in | Wt 210.2 lb

## 2017-07-21 DIAGNOSIS — G245 Blepharospasm: Secondary | ICD-10-CM | POA: Diagnosis not present

## 2017-07-21 DIAGNOSIS — J0120 Acute ethmoidal sinusitis, unspecified: Secondary | ICD-10-CM | POA: Diagnosis not present

## 2017-07-21 DIAGNOSIS — B0089 Other herpesviral infection: Secondary | ICD-10-CM

## 2017-07-21 DIAGNOSIS — M62838 Other muscle spasm: Secondary | ICD-10-CM | POA: Diagnosis not present

## 2017-07-21 DIAGNOSIS — G1220 Motor neuron disease, unspecified: Secondary | ICD-10-CM

## 2017-07-21 DIAGNOSIS — R768 Other specified abnormal immunological findings in serum: Secondary | ICD-10-CM

## 2017-07-21 DIAGNOSIS — R252 Cramp and spasm: Secondary | ICD-10-CM

## 2017-07-21 DIAGNOSIS — H5712 Ocular pain, left eye: Secondary | ICD-10-CM

## 2017-07-21 DIAGNOSIS — M792 Neuralgia and neuritis, unspecified: Secondary | ICD-10-CM | POA: Diagnosis not present

## 2017-07-21 DIAGNOSIS — R200 Anesthesia of skin: Secondary | ICD-10-CM

## 2017-07-21 DIAGNOSIS — R29818 Other symptoms and signs involving the nervous system: Secondary | ICD-10-CM

## 2017-07-21 LAB — POCT CBC
GRANULOCYTE PERCENT: 64.2 % (ref 37–80)
HCT, POC: 39.7 % (ref 37.7–47.9)
HEMOGLOBIN: 13.3 g/dL (ref 12.2–16.2)
Lymph, poc: 2.4 (ref 0.6–3.4)
MCH: 30.4 pg (ref 27–31.2)
MCHC: 33.6 g/dL (ref 31.8–35.4)
MCV: 90.6 fL (ref 80–97)
MID (cbc): 0.3 (ref 0–0.9)
MPV: 7.7 fL (ref 0–99.8)
PLATELET COUNT, POC: 248 10*3/uL (ref 142–424)
POC Granulocyte: 4.7 (ref 2–6.9)
POC LYMPH PERCENT: 32.3 %L (ref 10–50)
POC MID %: 3.5 %M (ref 0–12)
RBC: 4.38 M/uL (ref 4.04–5.48)
RDW, POC: 13 %
WBC: 7.3 10*3/uL (ref 4.6–10.2)

## 2017-07-21 LAB — POCT SEDIMENTATION RATE: POCT SED RATE: 30 mm/h — AB (ref 0–22)

## 2017-07-21 MED ORDER — PREDNISONE 20 MG PO TABS: ORAL_TABLET | ORAL | 0 refills | Status: DC

## 2017-07-21 MED ORDER — AMOXICILLIN-POT CLAVULANATE 875-125 MG PO TABS: 1.0000 | ORAL_TABLET | Freq: Two times a day (BID) | ORAL | 0 refills | Status: DC

## 2017-07-21 MED ORDER — DIAZEPAM 5 MG PO TABS: 5.0000 mg | ORAL_TABLET | Freq: Two times a day (BID) | ORAL | 1 refills | Status: DC | PRN

## 2017-07-21 MED ORDER — VITAMIN D (CHOLECALCIFEROL) 25 MCG (1000 UT) PO TABS: 1000.0000 [IU] | ORAL_TABLET | Freq: Every day | ORAL | Status: DC

## 2017-07-21 MED ORDER — VALACYCLOVIR HCL 1 G PO TABS: 1000.0000 mg | ORAL_TABLET | Freq: Every day | ORAL | 11 refills | Status: DC

## 2017-07-21 NOTE — Progress Notes (Signed)
Subjective:  By signing my name below, I, Sheila Nelson, attest that this documentation has been prepared under the direction and in the presence of Delman Cheadle, MD Electronically Signed: Ladene Artist, ED Scribe 07/21/2017 at 4:07 PM.   Patient ID: Sheila Nelson, female    DOB: [birth year], 56 y.o.   MRN: [id number]  Chief Complaint  Patient presents with  . URI    left eye twitch under left eye before sickness, facial numbness started Sunday of last week, cough, ST, sinus congestion, drainage    HPI Sheila Nelson is a 56 y.o. female who presents to Primary Care at [facility] complaining of persistent L eye twitch just below her L eye x 3 weeks. Pt has had L eye twitching in the past but states it has never lasted this long. She reports an itchy, painful and gritty sensation in her L eye and a "strip" of numbness from the corner of her L eye to the corner of her mouth which she thinks seems to be associated with outbreak of sores on her backside x 10 days ago. Pt took 4 days of Valtrex which resolved outbreak but not twitching. She has been sleeping normally and denies being under increased stress. Also reports spasms on the R occipital head that occur at least once daily, muscle cramps in the R leg and R ankle similar to a [employer], twitching in bilateral forearms and clumsiness. Pt has increased to 400 magnesium qhs which she she recently stopped, 1000 units of vitamin D and also cut back on coffee which did not seem to affect twitching either. Denies HA, falls. Pt is R hand dominant.  URI Pt also presents with symptoms of minimal cough, improving sore throat, postnasal drip, nasal congestion, sneezing for the past few days. She does report temporary hearing loss in the R ear that resolved 3 weeks ago. She has tried Claritin without relief. Denies nausea, vomiting.    Past Medical History:  Diagnosis Date  . Allergy   . Arthritis    feet and ankles  . ASTHMA UNSPECIFIED WITH  EXACERBATION   . Borderline diabetes   . Chest pain    a. 09/2015 felt to be non cardiac after LHC with no CAD   . Gallstones   . GERD (gastroesophageal reflux disease)   . H/O varicella   . Herpes   . HTN (hypertension)   . Hx of viral meningitis   . Obesity   . Pancreatitis    Viral  . PLANTAR FASCIITIS, RIGHT 09/30/2010  . PVC's (premature ventricular contractions)   . REACTIVE AIRWAY DISEASE 10/09/2009  . TINNITUS 10/09/2009   Current Outpatient Medications on File Prior to Visit  Medication Sig Dispense Refill  . amLODipine (NORVASC) 5 MG tablet Take 1 tablet (5 mg total) by mouth daily. 90 tablet 3  . famotidine (PEPCID) 20 MG tablet TAKE 1 TABLET BY MOUTH TWICE A DAY (Patient not taking: Reported on 06/04/2017) 60 tablet 0  . ibuprofen (ADVIL,MOTRIN) 600 MG tablet Take 800 mg by mouth every 6 (six) hours as needed for fever.     . loratadine (CLARITIN) 10 MG tablet Take 10 mg by mouth daily.    . MULTIPLE VITAMIN PO Take 1 tablet by mouth daily.    . Omega-3 Fatty Acids (OMEGA 3 PO) Take 2 capsules by mouth daily.    . ondansetron (ZOFRAN) 8 MG tablet Take 1 tablet (8 mg total) by mouth every 8 (eight) hours as needed for nausea or  vomiting. (Patient not taking: Reported on 07/21/2017) 20 tablet 0  . OVER THE COUNTER MEDICATION Fiber supplement capsules: 3-5 grams/day    . pantoprazole (PROTONIX) 40 MG tablet Take 1 tablet (40 mg total) by mouth daily. (Patient not taking: Reported on 06/04/2017) 30 tablet 2  . TURMERIC PO Take by mouth daily.    . [DISCONTINUED] beclomethasone (QVAR) 80 MCG/ACT inhaler Inhale 2 puffs into the lungs 2 (two) times daily. 1 Inhaler 0   No current facility-administered medications on file prior to visit.    No Known Allergies   Past Surgical History:  Procedure Laterality Date  . APPENDECTOMY  1992  . CARDIAC CATHETERIZATION N/A 09/17/2015   Procedure: Left Heart Cath and Coronary Angiography;  Surgeon: Burnell Blanks, MD;  Location: [facility];  Service: Cardiovascular;  Laterality: N/A;  . CESAREAN SECTION  06/23/2012   Procedure: CESAREAN SECTION;  Surgeon: Lovenia Kim, MD;  Location: [facility];  Service: Obstetrics;  Laterality: N/A;  . CHOLECYSTECTOMY N/A 10/05/2016   Procedure: LAPAROSCOPIC CHOLECYSTECTOMY;  Surgeon: Fanny Skates, MD;  Location: [facility];  Service: General;  Laterality: N/A;  . DILATION AND CURETTAGE OF UTERUS  2001  . INGUINAL HERNIA REPAIR     Infant, bilateral  . MOUTH SURGERY    . Right thigh surgery  1973   Trauma   Family History  Problem Relation Age of Onset  . Prostate cancer Father   . Stroke Father 38  . Heart failure Father   . Hypertension Father   . Cancer Father        prostate  . Valvular heart disease Mother        MVR  . Thrombocytopenia Mother   . Heart disease Mother   . Birth defects Sister        tetralogy of falot  . Hypertension Brother   . Prostate cancer Brother   . Stroke Maternal Grandmother   . Hypertension Brother   . Hypertension Brother   . Colon cancer Neg Hx   . Esophageal cancer Neg Hx   . Stomach cancer Neg Hx   . Rectal cancer Neg Hx    Social History   Socioeconomic History  . Marital status: Married    Spouse name: None  . Number of children: 1  . Years of education: None  . Highest education level: None  Social Needs  . Financial resource strain: None  . Food insecurity - worry: None  . Food insecurity - inability: None  . Transportation needs - medical: None  . Transportation needs - non-medical: None  Occupational History  . Occupation: Programmer, multimedia: [employer]  Tobacco Use  . Smoking status: Never Smoker  . Smokeless tobacco: Never Used  . Tobacco comment: Lives with spouse s/p wedding 07/2010. Employed as RN-pediatric critical care; now [employer]  Substance and Sexual Activity  . Alcohol use: Yes    Comment: wine with dinner every 2 weeks  . Drug use: No  . Sexual activity: Yes    Partners: Male  Other Topics  Concern  . 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   Married.     Depression screen [facility] 2/9 03/16/2017 08/17/2016 04/11/2016 10/20/2015  Decreased Interest 0 0 0 0  Down, Depressed, Hopeless 0 0 0 0  PHQ - 2 Score 0 0 0 0    Review of Systems  HENT: Positive for congestion, postnasal drip, sneezing and sore throat.  Eyes: Positive for pain (L).  Respiratory: Positive for cough (minimal).   Gastrointestinal: Negative for nausea and vomiting.  Musculoskeletal: Positive for myalgias.  Skin: Positive for rash.  Neurological: Positive for numbness (facial). Negative for headaches.      Objective:   Physical Exam  Constitutional: She is oriented to person, place, and time. She appears well-developed and well-nourished. No distress.  HENT:  Head: Normocephalic and atraumatic.  Right Ear: Tympanic membrane normal.  Left Ear: Tympanic membrane normal.  Nose: Nose normal.  Mouth/Throat: Posterior oropharyngeal edema and posterior oropharyngeal erythema (mild) present.  Eyes: Conjunctivae and EOM are normal.  Neck: Neck supple. No tracheal deviation present. No thyroid mass and no thyromegaly present.  Cardiovascular: Normal rate, regular rhythm, S1 normal, S2 normal and normal heart sounds.  Pulmonary/Chest: Effort normal and breath sounds normal. No respiratory distress.  Musculoskeletal: Normal range of motion.  Lymphadenopathy:    She has cervical adenopathy.       Right cervical: Posterior cervical adenopathy present.       Left cervical: Posterior cervical adenopathy present.  Neurological: She is alert and oriented to person, place, and time.  Skin: Skin is warm and dry.  Redness from prior outbreak.  Psychiatric: She has a normal mood and affect. Her behavior is normal.  Nursing note and vitals reviewed.  BP 116/88   Pulse 87   Temp 98.2 F (36.8 C)   Resp 16   Ht 5\' 4"  (1.626 m)   Wt 210 lb 3.2 oz (95.3 kg)   LMP 09/19/2011   SpO2 98%   BMI 36.08 kg/m      Results for orders placed or performed in visit on 07/21/17  POCT CBC  Result Value Ref Range   WBC 7.3 4.6 - 10.2 K/uL   Lymph, poc 2.4 0.6 - 3.4   POC LYMPH PERCENT 32.3 10 - 50 %L   MID (cbc) 0.3 0 - 0.9   POC MID % 3.5 0 - 12 %M   POC Granulocyte 4.7 2 - 6.9   Granulocyte percent 64.2 37 - 80 %G   RBC 4.38 4.04 - 5.48 M/uL   Hemoglobin 13.3 12.2 - 16.2 g/dL   HCT, POC 39.7 37.7 - 47.9 %   MCV 90.6 80 - 97 fL   MCH, POC 30.4 27 - 31.2 pg   MCHC 33.6 31.8 - 35.4 g/dL   RDW, POC 13.0 %   Platelet Count, POC 248 142 - 424 K/uL   MPV 7.7 0 - 99.8 fL   Assessment & Plan:   1. Acute non-recurrent ethmoidal sinusitis   2. Blepharospasm   3. Neuralgia and neuritis, unspecified   4. Muscle cramps   5. Muscle spasm   6. Spasm eyelid   7. Left facial numbness   8. Discomfort of left eye   9. Herpetic dermatitis     Orders Placed This Encounter  Procedures  . C-reactive protein  . Vitamin B12  . Comprehensive metabolic panel  . Thyroid Panel With TSH  . RPR  . ANA w/Reflex if Positive  . Magnesium  . Phosphorus  . CK  . RPR  . Phosphorus  . ANA w/Reflex if Positive  . Magnesium  . Ambulatory referral to Neurology    Referral Priority:   Routine    Referral Type:   Consultation    Referral Reason:   Specialty Services Required    Requested Specialty:   Neurology    Number of Visits Requested:  1  . POCT CBC  . POCT SEDIMENTATION RATE    Meds ordered this encounter  Medications  . Vitamin D, Cholecalciferol, 1000 units TABS    Sig: Take 1,000 Units by mouth daily.    Dispense:  60 tablet  . valACYclovir (VALTREX) 1000 MG tablet    Sig: Take 1 tablet (1,000 mg total) by mouth daily.    Dispense:  30 tablet    Refill:  11  . diazepam (VALIUM) 5 MG tablet    Sig: Take 1 tablet (5 mg total) by mouth every 12 (twelve) hours as needed for anxiety or muscle spasms. May take a second tab at night if needed.    Dispense:  30 tablet    Refill:  1  . predniSONE  (DELTASONE) 20 MG tablet    Sig: Take 3 tabs qd x 3d, then 2 tabs qd x 3d then 1 tab qd x 3d.    Dispense:  18 tablet    Refill:  0  . amoxicillin-clavulanate (AUGMENTIN) 875-125 MG tablet    Sig: Take 1 tablet by mouth 2 (two) times daily.    Dispense:  14 tablet    Refill:  0    I personally performed the services described in this documentation, which was scribed in my presence. The recorded information has been reviewed and considered, and addended by me as needed.   Delman Cheadle, M.D.  Primary Care at [facility] [address] [place], [place] [address] [phone] phone [phone] fax  07/24/17 1:31 AM

## 2017-07-21 NOTE — Patient Instructions (Signed)
     IF you received an x-ray today, you will receive an invoice from [facility]. Please contact [facility] at [phone] with questions or concerns regarding your invoice.   IF you received labwork today, you will receive an invoice from [facility]. Please contact [facility] at [phone] with questions or concerns regarding your invoice.   Our billing staff will not be able to assist you with questions regarding bills from these companies.  You will be contacted with the lab results as soon as they are available. The fastest way to get your results is to activate your My Chart account. Instructions are located on the last page of this paperwork. If you have not heard from us regarding the results in 2 weeks, please contact this office.     

## 2017-07-23 LAB — ANA W/REFLEX IF POSITIVE
ANA: POSITIVE — AB
Anti JO-1: <0.2 AI (ref 0.0–0.9)
Centromere Ab Screen: <0.2 AI (ref 0.0–0.9)
Chromatin Ab SerPl-aCnc: 0.7 AI (ref 0.0–0.9)
Scleroderma SCL-70: <0.2 AI (ref 0.0–0.9)
dsDNA Ab: 11 IU/mL — ABNORMAL HIGH (ref 0–9)

## 2017-07-23 LAB — THYROID PANEL WITH TSH
FREE THYROXINE INDEX: 1.7 (ref 1.2–4.9)
T3 Uptake Ratio: 25 % (ref 24–39)
T4, Total: 6.8 ug/dL (ref 4.5–12.0)
TSH: 2.08 u[IU]/mL (ref 0.450–4.500)

## 2017-07-23 LAB — MAGNESIUM: MAGNESIUM: 2.1 mg/dL (ref 1.6–2.3)

## 2017-07-23 LAB — COMPREHENSIVE METABOLIC PANEL
A/G RATIO: 1.7 (ref 1.2–2.2)
ALBUMIN: 4.7 g/dL (ref 3.5–5.5)
ALT: 25 IU/L (ref 0–32)
AST: 26 IU/L (ref 0–40)
Alkaline Phosphatase: 115 IU/L (ref 39–117)
BILIRUBIN TOTAL: 0.5 mg/dL (ref 0.0–1.2)
BUN / CREAT RATIO: 21 (ref 9–23)
BUN: 17 mg/dL (ref 6–24)
CHLORIDE: 101 mmol/L (ref 96–106)
CO2: 23 mmol/L (ref 20–29)
Calcium: 9.4 mg/dL (ref 8.7–10.2)
Creatinine, Ser: 0.8 mg/dL (ref 0.57–1.00)
GFR calc non Af Amer: 84 mL/min/{1.73_m2} (ref >59)
GFR, EST AFRICAN AMERICAN: 97 mL/min/{1.73_m2} (ref >59)
GLOBULIN, TOTAL: 2.7 g/dL (ref 1.5–4.5)
Glucose: 99 mg/dL (ref 65–99)
POTASSIUM: 4 mmol/L (ref 3.5–5.2)
SODIUM: 140 mmol/L (ref 134–144)
TOTAL PROTEIN: 7.4 g/dL (ref 6.0–8.5)

## 2017-07-23 LAB — PHOSPHORUS: PHOSPHORUS: 3.7 mg/dL (ref 2.5–4.5)

## 2017-07-23 LAB — RPR: RPR Ser Ql: NONREACTIVE

## 2017-07-23 LAB — VITAMIN B12: Vitamin B-12: 775 pg/mL (ref 232–1245)

## 2017-07-23 LAB — C-REACTIVE PROTEIN: CRP: 16.9 mg/L — ABNORMAL HIGH (ref 0.0–4.9)

## 2017-07-26 ENCOUNTER — Ambulatory Visit (INDEPENDENT_AMBULATORY_CARE_PROVIDER_SITE_OTHER): Payer: BLUE CROSS/BLUE SHIELD

## 2017-07-26 ENCOUNTER — Other Ambulatory Visit: Payer: Self-pay

## 2017-07-26 ENCOUNTER — Ambulatory Visit (INDEPENDENT_AMBULATORY_CARE_PROVIDER_SITE_OTHER): Payer: BLUE CROSS/BLUE SHIELD | Admitting: Physician Assistant

## 2017-07-26 ENCOUNTER — Encounter: Payer: Self-pay | Admitting: Physician Assistant

## 2017-07-26 VITALS — BP 130/82 | HR 84 | Temp 97.7°F | Resp 16 | Ht 64.0 in | Wt 212.8 lb

## 2017-07-26 DIAGNOSIS — R059 Cough, unspecified: Secondary | ICD-10-CM

## 2017-07-26 DIAGNOSIS — R05 Cough: Secondary | ICD-10-CM | POA: Diagnosis not present

## 2017-07-26 MED ORDER — AZITHROMYCIN 500 MG PO TABS: 500.0000 mg | ORAL_TABLET | Freq: Every day | ORAL | 0 refills | Status: DC

## 2017-07-26 NOTE — Patient Instructions (Addendum)
  Dg Chest 2 View  Result Date: 07/26/2017 CLINICAL DATA:  Cough EXAM: CHEST  2 VIEW COMPARISON:  09/17/2015 FINDINGS: The heart size and mediastinal contours are within normal limits. Both lungs are clear. The visualized skeletal structures are unremarkable. IMPRESSION: No active cardiopulmonary disease. Electronically Signed   By: Franchot Gallo M.D.   On: 07/26/2017 15:00    Start the [place].    IF you received an x-ray today, you will receive an invoice from [facility]. Please contact [facility] at [phone] with questions or concerns regarding your invoice.   IF you received labwork today, you will receive an invoice from [facility]. Please contact [facility] at [phone] with questions or concerns regarding your invoice.   Our billing staff will not be able to assist you with questions regarding bills from these companies.  You will be contacted with the lab results as soon as they are available. The fastest way to get your results is to activate your My Chart account. Instructions are located on the last page of this paperwork. If you have not heard from [place] regarding the results in 2 weeks, please contact this office.

## 2017-07-26 NOTE — Progress Notes (Signed)
07/26/2017 3:05 PM   DOB: 1961/01/26 / MRN: [id number]  SUBJECTIVE:  Sheila Nelson is a 56 y.o. female presenting for cough worsening with prednisone.  She just saw Dr. Brigitte Pulse for similar symptoms and was also prescribed Augmentin which she tried but stopped due to almost immediate diarrhea.  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  She is on day three of 60 mg pred and is about to taper down to 40 mg.  She does have a history of asthma and just tried some xopenex which is helping her somewhat. She denies SOB, new DOE, leg pain or leg swelling.   She has No Known Allergies.   She  has a past medical history of Allergy, Arthritis, ASTHMA UNSPECIFIED WITH EXACERBATION, Borderline diabetes, Chest pain, Gallstones, GERD (gastroesophageal reflux disease), H/O varicella, Herpes, HTN (hypertension), viral meningitis, Obesity, Pancreatitis, PLANTAR FASCIITIS, RIGHT (09/30/2010), PVC's (premature ventricular contractions), REACTIVE AIRWAY DISEASE (10/09/2009), and TINNITUS (10/09/2009).    She  reports that  has never smoked. she has never used smokeless tobacco. She reports that she drinks alcohol. She reports that she does not use drugs. She  reports that she currently engages in sexual activity and has had partners who are Female. The patient  has a past surgical history that includes Right thigh surgery (1973); Inguinal hernia repair; Dilation and curettage of uterus (2001); Mouth surgery; Cesarean section (06/23/2012); Cardiac catheterization (N/A, 09/17/2015); Cholecystectomy (N/A, 10/05/2016); and Appendectomy (1992).  Her family history includes Birth defects in her sister; Cancer in her father; Heart disease in her mother; Heart failure in her father; Hypertension in her brother, brother, brother, and father; Prostate cancer in her brother and father; Stroke in her maternal grandmother; Stroke (age of onset: 1) in her father; Thrombocytopenia in her mother; Valvular  heart disease in her mother.  Review of Systems  Constitutional: Negative for chills and fever.  Respiratory: Positive for cough and sputum production. Negative for hemoptysis, shortness of breath and wheezing.   Cardiovascular: Negative for chest pain and leg swelling.  Skin: Negative for itching and rash.  Neurological: Negative for dizziness.    The problem list and medications were reviewed and updated by myself where necessary and exist elsewhere in the encounter.   OBJECTIVE:  BP 130/82 (BP Location: Left Arm, Patient Position: Sitting, Cuff Size: Normal)   Pulse 84   Temp 97.7 F (36.5 C) (Oral)   Resp 16   Ht 5\' 4"  (1.626 m)   Wt 212 lb 12.8 oz (96.5 kg)   LMP 09/19/2011   SpO2 96%   PF 350 L/min   BMI 36.53 kg/m   Physical Exam  Constitutional: She is active.  Non-toxic appearance.  Cardiovascular: Normal rate, regular rhythm, S1 normal, S2 normal, normal heart sounds and intact distal pulses. Exam reveals no gallop, no friction rub and no decreased pulses.  No murmur heard. Pulmonary/Chest: Effort normal. No stridor. No tachypnea. No respiratory distress. She has no wheezes. She has no rales.  Abdominal: She exhibits no distension.  Musculoskeletal: She exhibits no edema.  Neurological: She is alert.  Skin: Skin is warm and dry. She is not diaphoretic. No pallor.    No results found for this or any previous visit (from the past 72 hour(s)).  Dg Chest 2 View  Result Date: 07/26/2017 CLINICAL DATA:  Cough EXAM: CHEST  2 VIEW COMPARISON:  09/17/2015 FINDINGS: The heart size and mediastinal contours are within normal limits. Both lungs  are clear. The visualized skeletal structures are unremarkable. IMPRESSION: No active cardiopulmonary disease. Electronically Signed   By: Franchot Gallo M.D.   On: 07/26/2017 15:00    ASSESSMENT AND PLAN:  Mahitha was seen today for uri.  Diagnoses and all orders for this visit:  Cough: She will start the Zpack and finish the  pred.  She has a xopenex inhaler.  Peak flow not great but no terrible either.  She did not want any narcotics. Advised we'd be happy to see her back if not improving.  -     DG Chest 2 View; Future -     Respiratory virus panel    The patient is advised to call or return to clinic if she does not see an improvement in symptoms, or to seek the care of the closest emergency department if she worsens with the above plan.   Philis Fendt, MHS, PA-C Primary Care at [facility] 07/26/2017 3:05 PM

## 2017-07-26 NOTE — Addendum Note (Signed)
Addended by: Shawnee Knapp on: 07/26/2017 11:17 AM   Modules accepted: Orders

## 2017-07-30 ENCOUNTER — Telehealth: Payer: Self-pay | Admitting: Family Medicine

## 2017-07-30 NOTE — Telephone Encounter (Signed)
Called [facility] to see if they could see pt within the 2 week period she stated that were booking out until [place] but will call pt and get something scheduled and will put pt on cancellation list.. Also called [facility] and left a message for someone to call me back.Marland Kitchen

## 2017-07-30 NOTE — Addendum Note (Signed)
Addended by: Shawnee Knapp on: 07/30/2017 08:24 AM   Modules accepted: Orders

## 2017-07-30 NOTE — Telephone Encounter (Signed)
[facility] called and stated they are booking out untl march.Hulen Skains [facility] and left a message for someone to call me back

## 2017-07-31 NOTE — Telephone Encounter (Signed)
Pt told me she got on a cancellation list and got an appt for Wed 12/5! Thanks for all of your work.

## 2017-08-02 ENCOUNTER — Telehealth: Payer: Self-pay | Admitting: Family Medicine

## 2017-08-02 NOTE — Telephone Encounter (Signed)
Pt had two MRI's scheduled for 08/11/17 at [facility]. Dr. Brigitte Pulse had requested pt have MRI's no later than 08/06/17 so I called [facility] to see what they had available and they had an appt on 08/05/17 for 5pm and 7pm. They will need pt to arrive at 4:45. I wanted to speak with the pt to see if she could do this before we canceled the appts for [facility] on 08/11/17 but the scheduler went ahead and canceled them because she did not know this, so she could secure the 08/05/17 spot. I left pt a vm with this information to let her know her appt has changed to now be at [facility] on 08/05/17 at [place] and the second MRI at 7pm and to arrive at 4:45pm. If this did not work for the pt since I was unable to speak with her before changes were made, I will be happy to schedule for a different time. I tried calling the pt back again with this info but the vm was full. I left a detailed message the first time with this. If the pt calls back, please relay this message to her about her new appt. If there are any issues or she has questions, I will be happy to assist.

## 2017-08-03 ENCOUNTER — Telehealth: Payer: Self-pay | Admitting: Family Medicine

## 2017-08-03 NOTE — Telephone Encounter (Signed)
Pt is scheduled to have [facility] 12/6 pt also has MR CERVICAL SPINE W WO CNTRST scheduled for 12/6.Marland Kitchen Its also another order put in 11/24 for a MR BRAIN WO CONTRAST do you still want to do that last mr brain wo contrast?

## 2017-08-04 ENCOUNTER — Encounter: Payer: Self-pay | Admitting: Neurology

## 2017-08-04 ENCOUNTER — Ambulatory Visit (INDEPENDENT_AMBULATORY_CARE_PROVIDER_SITE_OTHER): Payer: BLUE CROSS/BLUE SHIELD | Admitting: Neurology

## 2017-08-04 VITALS — BP 133/83 | HR 76 | Ht 64.0 in | Wt 209.8 lb

## 2017-08-04 DIAGNOSIS — R253 Fasciculation: Secondary | ICD-10-CM | POA: Insufficient documentation

## 2017-08-04 HISTORY — DX: Fasciculation: R25.3

## 2017-08-04 NOTE — Progress Notes (Signed)
PATIENT: Sheila Nelson DOB: [birth year]  Chief Complaint  Patient presents with  . Possible motor neuron    She is to have her neuralgia, muscle cramps, eyelid spasms and left facial numbness further evaluated.  She has pending cervical and brain MRI scans on 08/05/17.  Marland Kitchen PCP    Shawnee Knapp, MD     HISTORICAL  Sheila Nelson is a 56 year old female, works as a Equities trader for [employer], seen in refer by primary care doctor Shawnee Knapp for evaluation of persistent left lower eyelid muscle fasciculation, initial evaluation was on August 04, 2017.  I reviewed and summarized the referring note, she has a history of hypertension, also had a history of chickenpox in the past, over the years, she had recurrent episode of shingles breakout, up to 4 times a year, gradually getting worse over the years, it is usually correlate with her upper respiratory infection, exertion, blisters at sacral/coccygeal region, lasting for 1-2 weeks, oftentimes she had concurrent left eye sandy sensation, numbness tingling around left lateral eye corner, previously was seen by ophthalmologist, there was no abnormality found.  Her most recent shingles outbreak was at the end of November 2018, she was treated with Valtrex 1000 mg daily for 10 days,  But even before that, in October 2018, she noticed initially intermittent left lower eyelid twitching, now has become persistent, at the end of the day, felt almost painful left face, muscle achy, she denies visual change, no muscle weakness, occasionally muscle fasciculation in her limb muscle, there was no muscle atrophy.  She also reported long history of bilateral tinnitus, subjective decreased hearing, she has to face people to hear clearly,  She used to be a vivid cyclist, no complaints of clumsiness, bumping into things easily, difficulty to balance on her bicycle  MRI of the brain with and without contrast, MRI of cervical is scheduled for August 05, 2017.  Extensive laboratory evaluations reviewed, normal magnesium, RPR, thyroid panel, CMP, B12, CK, phosphorus, there was mild elevated ESR 30, positive ANA, mild elevated dsDNA antibody  REVIEW OF SYSTEMS: Full 14 system review of systems performed and notable only for blurred vision, hearing loss, ringing the ears, headache, weakness, joint pain, cramps, allergy  ALLERGIES: No Known Allergies  HOME MEDICATIONS: Current Outpatient Medications  Medication Sig Dispense Refill  . amLODipine (NORVASC) 5 MG tablet Take 1 tablet (5 mg total) by mouth daily. 90 tablet 3  . IBUPROFEN PO Take by mouth as needed.    . loratadine (CLARITIN) 10 MG tablet Take 10 mg by mouth daily.    . MULTIPLE VITAMIN PO Take 1 tablet by mouth daily.    . Omega-3 Fatty Acids (OMEGA 3 PO) Take 2 capsules by mouth daily.    Marland Kitchen OVER THE COUNTER MEDICATION Fiber supplement capsules: 3-5 grams/day    . valACYclovir (VALTREX) 1000 MG tablet Take 1 tablet (1,000 mg total) by mouth daily. 30 tablet 11   No current facility-administered medications for this visit.     PAST MEDICAL HISTORY: Past Medical History:  Diagnosis Date  . Allergy   . Arthritis    feet and ankles  . ASTHMA UNSPECIFIED WITH EXACERBATION   . Borderline diabetes   . Chest pain    a. 09/2015 felt to be non cardiac after LHC with no CAD   . Gallstones   . GERD (gastroesophageal reflux disease)   . H/O varicella   . Herpes   . HTN (hypertension)   . Hx  of viral meningitis   . Obesity   . Pancreatitis    Viral  . PLANTAR FASCIITIS, RIGHT 09/30/2010  . PVC's (premature ventricular contractions)   . REACTIVE AIRWAY DISEASE 10/09/2009  . TINNITUS 10/09/2009    PAST SURGICAL HISTORY: Past Surgical History:  Procedure Laterality Date  . APPENDECTOMY  1992  . CARDIAC CATHETERIZATION N/A 09/17/2015   Procedure: Left Heart Cath and Coronary Angiography;  Surgeon: Burnell Blanks, MD;  Location: [facility];  Service: Cardiovascular;   Laterality: N/A;  . CESAREAN SECTION  06/23/2012   Procedure: CESAREAN SECTION;  Surgeon: Lovenia Kim, MD;  Location: [facility];  Service: Obstetrics;  Laterality: N/A;  . CHOLECYSTECTOMY N/A 10/05/2016   Procedure: LAPAROSCOPIC CHOLECYSTECTOMY;  Surgeon: Fanny Skates, MD;  Location: [facility];  Service: General;  Laterality: N/A;  . DILATION AND CURETTAGE OF UTERUS  2001  . INGUINAL HERNIA REPAIR     Infant, bilateral  . MOUTH SURGERY    . Right thigh surgery  1973   Trauma    FAMILY HISTORY: Family History  Problem Relation Age of Onset  . Prostate cancer Father   . Stroke Father 26  . Heart failure Father   . Hypertension Father   . Cancer Father        prostate  . Valvular heart disease Mother        MVR  . Thrombocytopenia Mother   . Heart disease Mother   . Birth defects Sister        tetralogy of falot  . Hypertension Brother   . Prostate cancer Brother   . Stroke Maternal Grandmother   . Hypertension Brother   . Hypertension Brother   . Colon cancer Neg Hx   . Esophageal cancer Neg Hx   . Stomach cancer Neg Hx   . Rectal cancer Neg Hx     SOCIAL HISTORY:  Social History   Socioeconomic History  . Marital status: Married    Spouse name: Not on file  . Number of children: 1  . Years of education: 32  . Highest education level: Bachelor's degree (e.g., BA, AB, BS)  Social Needs  . Financial resource strain: Not on file  . Food insecurity - worry: Not on file  . Food insecurity - inability: Not on file  . Transportation needs - medical: Not on file  . Transportation needs - non-medical: Not on file  Occupational History  . Occupation: Programmer, multimedia: [employer]  Tobacco Use  . Smoking status: Never Smoker  . Smokeless tobacco: Never Used  . Tobacco comment: Lives with spouse s/p wedding 07/2010. Employed as RN-pediatric critical care; now [employer]  Substance and Sexual Activity  . Alcohol use: Yes    Comment: 3-5 glasses of wine per week  . Drug use:  No  . Sexual activity: Yes    Partners: Male  Other Topics Concern  . Not on file  Social History Narrative   Lives with partner and son.   1-2 cups caffeine per day.   Right-handed.     PHYSICAL EXAM   Vitals:   08/04/17 0819  BP: 133/83  Pulse: 76  Weight: 209 lb 12 oz (95.1 kg)  Height: _0  (1.626 m)    Not recorded      Body mass index is 36 kg/m.  PHYSICAL EXAMNIATION:  Gen: NAD, conversant, well nourised, obese, well groomed  Cardiovascular: Regular rate rhythm, no peripheral edema, warm, nontender. Eyes: Conjunctivae clear without exudates or hemorrhage Neck: Supple, no carotid bruits. Pulmonary: Clear to auscultation bilaterally   NEUROLOGICAL EXAM:  MENTAL STATUS: Speech:    Speech is normal; fluent and spontaneous with normal comprehension.  Cognition:     Orientation to time, place and person     Normal recent and remote memory     Normal Attention span and concentration     Normal Language, naming, repeating,spontaneous speech     Fund of knowledge   CRANIAL NERVES: CN II: Visual fields are full to confrontation. Fundoscopic exam is normal with sharp discs and no vascular changes. Pupils are round equal and briskly reactive to light. CN III, IV, VI: extraocular movement are normal. No ptosis. CN V: Facial sensation is intact to pinprick in all 3 divisions bilaterally. Corneal responses are intact.  CN VII: Face is symmetric with normal eye closure and smile.  She has constant left lower eyelid muscle fasciculation CN VIII: Hearing is normal to rubbing fingers CN IX, X: Palate elevates symmetrically. Phonation is normal. CN XI: Head turning and shoulder shrug are intact CN XII: Tongue is midline with normal movements and no atrophy.  MOTOR: There is no pronator drift of out-stretched arms. Muscle bulk and tone are normal. Muscle strength is normal.  REFLEXES: Reflexes are 2+ and symmetric at the biceps, triceps, knees, and  ankles. Plantar responses are flexor.  SENSORY: Intact to light touch, pinprick, positional sensation and vibratory sensation are intact in fingers and toes.  COORDINATION: Rapid alternating movements and fine finger movements are intact. There is no dysmetria on finger-to-nose and heel-knee-shin.    GAIT/STANCE: Posture is normal. Gait is steady with normal steps, base, arm swing, and turning. Heel and toe walking are normal. Tandem gait is normal.  Romberg is absent.   DIAGNOSTIC DATA (LABS, IMAGING, TESTING) - I reviewed patient records, labs, notes, testing and imaging myself where available.   ASSESSMENT AND PLAN  Sheila Nelson is a 56 y.o. female    Left lower eyelid muscle fasciculations Constellation of complaints, including tinnitus, subjective hearing loss, unsteady gait,  MRI of the brain with and without contrast and MRI of cervical spine was scheduled August 05, 2017  Preauthorization for xeomin 50 units for left lower eye lid muscle fasciculation  Marcial Pacas, M.D. Ph.D.  [facility] [address], [place], [place] [address] Ph: [phone] Fax: [phone]  CC: Shawnee Knapp, MD

## 2017-08-05 ENCOUNTER — Ambulatory Visit (HOSPITAL_COMMUNITY)
Admission: RE | Admit: 2017-08-05 | Discharge: 2017-08-05 | Disposition: A | Discharge status: Home / Self Care | Payer: BLUE CROSS/BLUE SHIELD | Source: Ambulatory Visit | Attending: Family Medicine | Admitting: Family Medicine

## 2017-08-05 DIAGNOSIS — M50221 Other cervical disc displacement at C4-C5 level: Secondary | ICD-10-CM | POA: Diagnosis not present

## 2017-08-05 DIAGNOSIS — G1220 Motor neuron disease, unspecified: Secondary | ICD-10-CM | POA: Insufficient documentation

## 2017-08-05 DIAGNOSIS — R29818 Other symptoms and signs involving the nervous system: Secondary | ICD-10-CM | POA: Diagnosis not present

## 2017-08-05 DIAGNOSIS — M50222 Other cervical disc displacement at C5-C6 level: Secondary | ICD-10-CM | POA: Insufficient documentation

## 2017-08-05 DIAGNOSIS — R200 Anesthesia of skin: Secondary | ICD-10-CM | POA: Diagnosis not present

## 2017-08-05 MED ORDER — GADOBENATE DIMEGLUMINE 529 MG/ML IV SOLN
20.0000 mL | Freq: Once | INTRAVENOUS | Status: AC | PRN
  Administered 2017-08-05: 19 mL via INTRAVENOUS

## 2017-08-05 NOTE — Telephone Encounter (Signed)
Pt needs the brain MRI w and w/o contrast only.  Do NOT need with w/o contrast only study. Thanks for checking.

## 2017-08-11 ENCOUNTER — Other Ambulatory Visit: Payer: BLUE CROSS/BLUE SHIELD

## 2017-08-11 ENCOUNTER — Telehealth: Payer: Self-pay | Admitting: Neurology

## 2017-08-11 NOTE — Telephone Encounter (Signed)
Spoke to patient - she has decided to cancel her appt for Xeomin.  She will reach out to Dr. Brigitte Pulse for her MRI results (ordering MD).

## 2017-08-11 NOTE — Telephone Encounter (Signed)
Patient wanted to cancel her Xeomin apt because she is no longer having the symptoms. She also wanted to speak with someone regarding the results of her MRI. I informed her we have been out of office and she was very understanding but I told her to be expecting a call shortly. Please call and advise.

## 2017-08-12 ENCOUNTER — Ambulatory Visit: Payer: BLUE CROSS/BLUE SHIELD | Admitting: Neurology

## 2017-08-13 ENCOUNTER — Telehealth: Payer: Self-pay | Admitting: Neurology

## 2017-08-13 NOTE — Telephone Encounter (Signed)
Please give her a follow-up appointment for EMG guided xeomin injection for left lower eyelid fasciculations

## 2017-08-16 NOTE — Telephone Encounter (Signed)
Patient spoke with Andee Poles and wanted to cancel her Xeomin appt because she is no longer having symptoms.  Dr. Krista Blue I is aware.

## 2017-08-18 ENCOUNTER — Telehealth: Payer: Self-pay | Admitting: Family Medicine

## 2017-08-18 DIAGNOSIS — M5010 Cervical disc disorder with radiculopathy, unspecified cervical region: Secondary | ICD-10-CM

## 2017-08-18 DIAGNOSIS — M792 Neuralgia and neuritis, unspecified: Secondary | ICD-10-CM

## 2017-08-18 DIAGNOSIS — M48062 Spinal stenosis, lumbar region with neurogenic claudication: Secondary | ICD-10-CM

## 2017-08-18 DIAGNOSIS — M4712 Other spondylosis with myelopathy, cervical region: Secondary | ICD-10-CM

## 2017-08-18 DIAGNOSIS — G9519 Other vascular myelopathies: Secondary | ICD-10-CM

## 2017-08-18 DIAGNOSIS — G959 Disease of spinal cord, unspecified: Secondary | ICD-10-CM

## 2017-08-18 MED ORDER — PREDNISONE 20 MG PO TABS: ORAL_TABLET | ORAL | 0 refills | Status: DC

## 2017-08-18 MED ORDER — CYCLOBENZAPRINE HCL 5 MG PO TABS: 5.0000 mg | ORAL_TABLET | Freq: Three times a day (TID) | ORAL | 1 refills | Status: DC | PRN

## 2017-08-18 NOTE — Telephone Encounter (Signed)
Pt called -having really severe neck/shoulder/upper back pain with intermittent numbness into left thumb/index finger. Have massage scheduled for this afternoon (Wed) and acupuncture as well.  Would like to try another steroid taper if appropriate.   Will write rx for accupunture and therapeutic massage so pt can try to get reimbursed from insurance   Meds ordered this encounter  Medications  . predniSONE (DELTASONE) 20 MG tablet    Sig: Take 3 tabs qd x 3d, then 2 tabs qd x 3d then 1 tab qd x 3d.    Dispense:  18 tablet    Refill:  0  . cyclobenzaprine (FLEXERIL) 5 MG tablet    Sig: Take 1-2 tablets (5-10 mg total) by mouth 3 (three) times daily as needed for muscle spasms. Will cause sedation    Dispense:  30 tablet    Refill:  1

## 2017-08-20 NOTE — Telephone Encounter (Signed)
Pt is now having weakness in left arm - difficult to lift at all, can't grasp. ------- Will put in stat neurosurgery referral. Her accupuncturist recommended Dr. Ellene Route at [facility] but she would be happy to be seen by anyone asap since it is a holiday week, she knows she would be very fortunate and lucky to get any appointment with any office but would still like to try.  Please send off referral with this note, MRI of cspine from 08/05/2017, and 07/21/17 OV.  Octa!!!

## 2017-08-20 NOTE — Addendum Note (Signed)
Addended by: Shawnee Knapp on: 08/20/2017 09:52 AM   Modules accepted: Orders

## 2017-09-02 ENCOUNTER — Telehealth: Payer: Self-pay | Admitting: Family Medicine

## 2017-09-02 MED ORDER — METHOCARBAMOL 500 MG PO TABS: 500.0000 mg | ORAL_TABLET | Freq: Four times a day (QID) | ORAL | 1 refills | Status: DC

## 2017-09-02 MED ORDER — PREDNISONE 20 MG PO TABS: ORAL_TABLET | ORAL | 0 refills | Status: DC

## 2017-09-02 NOTE — Telephone Encounter (Signed)
Copied from [facility] #[id number]. Topic: General - Other >> Sep 02, 2017 10:54 AM Aurelio Brash B wrote: Pt states she has more Numbness in left arm, middle back pain, cant get out of bed -  Dr.  Brigitte Pulse pt-   prefers  mobile number-  pt would like a call,  offered her nurse triage but she does not want to speak to nurse.

## 2017-09-07 ENCOUNTER — Ambulatory Visit: Payer: Self-pay | Admitting: *Deleted

## 2017-09-07 ENCOUNTER — Encounter: Payer: Self-pay | Admitting: Urgent Care

## 2017-09-07 ENCOUNTER — Ambulatory Visit (INDEPENDENT_AMBULATORY_CARE_PROVIDER_SITE_OTHER): Payer: BLUE CROSS/BLUE SHIELD | Admitting: Urgent Care

## 2017-09-07 VITALS — BP 120/81 | HR 83 | Temp 97.8°F | Resp 18 | Ht 64.0 in | Wt 215.4 lb

## 2017-09-07 DIAGNOSIS — I10 Essential (primary) hypertension: Secondary | ICD-10-CM | POA: Diagnosis not present

## 2017-09-07 DIAGNOSIS — R5381 Other malaise: Secondary | ICD-10-CM | POA: Diagnosis not present

## 2017-09-07 DIAGNOSIS — R519 Headache, unspecified: Secondary | ICD-10-CM

## 2017-09-07 DIAGNOSIS — R51 Headache: Secondary | ICD-10-CM | POA: Diagnosis not present

## 2017-09-07 MED ORDER — HYDROCHLOROTHIAZIDE 12.5 MG PO TABS: 12.5000 mg | ORAL_TABLET | Freq: Every day | ORAL | 0 refills | Status: DC

## 2017-09-07 MED FILL — HYDROCHLOROTHIAZIDE 12.5 MG: 12.5 | 30 days supply | Qty: 30 | Fill #0

## 2017-09-07 NOTE — Patient Instructions (Signed)
Hypertension Hypertension is another name for high blood pressure. High blood pressure forces your heart to work harder to pump blood. This can cause problems over time. There are two numbers in a blood pressure reading. There is a top number (systolic) over a bottom number (diastolic). It is best to have a blood pressure below 120/80. Healthy choices can help lower your blood pressure. You may need medicine to help lower your blood pressure if:  Your blood pressure cannot be lowered with healthy choices.  Your blood pressure is higher than 130/80.  Follow these instructions at home: Eating and drinking  If directed, follow the DASH eating plan. This diet includes: ? Filling half of your plate at each meal with fruits and vegetables. ? Filling one quarter of your plate at each meal with whole grains. Whole grains include whole wheat pasta, brown rice, and whole grain bread. ? Eating or drinking low-fat dairy products, such as skim milk or low-fat yogurt. ? Filling one quarter of your plate at each meal with low-fat (lean) proteins. Low-fat proteins include fish, skinless chicken, eggs, beans, and tofu. ? Avoiding fatty meat, cured and processed meat, or chicken with skin. ? Avoiding premade or processed food.  Eat less than 1,500 mg of salt (sodium) a day.  Limit alcohol use to no more than 1 drink a day for nonpregnant women and 2 drinks a day for men. One drink equals 12 oz of beer, 5 oz of wine, or 1 oz of hard liquor. Lifestyle  Work with your doctor to stay at a healthy weight or to lose weight. Ask your doctor what the best weight is for you.  Get at least 30 minutes of exercise that causes your heart to beat faster (aerobic exercise) most days of the week. This may include walking, swimming, or biking.  Get at least 30 minutes of exercise that strengthens your muscles (resistance exercise) at least 3 days a week. This may include lifting weights or pilates.  Do not use any  products that contain nicotine or tobacco. This includes cigarettes and e-cigarettes. If you need help quitting, ask your doctor.  Check your blood pressure at home as told by your doctor.  Keep all follow-up visits as told by your doctor. This is important. Medicines  Take over-the-counter and prescription medicines only as told by your doctor. Follow directions carefully.  Do not skip doses of blood pressure medicine. The medicine does not work as well if you skip doses. Skipping doses also puts you at risk for problems.  Ask your doctor about side effects or reactions to medicines that you should watch for. Contact a doctor if:  You think you are having a reaction to the medicine you are taking.  You have headaches that keep coming back (recurring).  You feel dizzy.  You have swelling in your ankles.  You have trouble with your vision. Get help right away if:  You get a very bad headache.  You start to feel confused.  You feel weak or numb.  You feel faint.  You get very bad pain in your: ? Chest. ? Belly (abdomen).  You throw up (vomit) more than once.  You have trouble breathing. Summary  Hypertension is another name for high blood pressure.  Making healthy choices can help lower blood pressure. If your blood pressure cannot be controlled with healthy choices, you may need to take medicine. This information is not intended to replace advice given to you by your health care   provider. Make sure you discuss any questions you have with your health care provider. Document Released: 02/03/2008 Document Revised: 07/15/2016 Document Reviewed: 07/15/2016 Elsevier Interactive Patient Education  2018 Elsevier Inc.     IF you received an x-ray today, you will receive an invoice from [facility]. Please contact [facility] at [phone] with questions or concerns regarding your invoice.   IF you received labwork today, you will receive an invoice  from [facility]. Please contact [facility] at [phone] with questions or concerns regarding your invoice.   Our billing staff will not be able to assist you with questions regarding bills from these companies.  You will be contacted with the lab results as soon as they are available. The fastest way to get your results is to activate your My Chart account. Instructions are located on the last page of this paperwork. If you have not heard from us regarding the results in 2 weeks, please contact this office.      

## 2017-09-07 NOTE — Telephone Encounter (Signed)
   Answer Assessment - Initial Assessment Questions 1. BLOOD PRESSURE: "What is the blood pressure?" "Did you take at least two measurements 5 minutes apart?"     149/101 2. ONSET: "When did you take your blood pressure?"     0730 this am 3. HOW: "How did you obtain the blood pressure?" (e.g., visiting nurse, automatic home BP monitor)    Home monitor 4. HISTORY: "Do you have a history of high blood pressure?"     Yes, just completed steroid "blast" 5. MEDICATIONS: "Are you taking any medications for blood pressure?" "Have you missed any doses recently?  "Yes, no missed doses      6. OTHER SYMPTOMS: "Do you have any symptoms?" (e.g., headache, chest pain, blurred vision, difficulty breathing, weakness)     Headache and ear ache 5/10  Protocols used: HIGH BLOOD PRESSURE-A-AH

## 2017-09-07 NOTE — Progress Notes (Signed)
  MRN: [id number] DOB: [birth year]  Subjective:   Sheila Nelson is a 57 y.o. female presenting for 1 day history of headache, malaise, foot swelling. Patient reports that her blood pressure has been in 409'W systolic and 119'J diastolic. She took 2 pills of amlodipine this morning which has helped her BP. Denies confusion, speech difficulty, chest pain, heart racing, palpitations, abdominal pain, hematuria. She has undergone steroid course twice this past month, finished the last course on 09/05/2017. She is also working a herniated disc of her neck.  Sheila Nelson has a current medication list which includes the following prescription(s): amlodipine, cyclobenzaprine, ibuprofen, loratadine, methocarbamol, multiple vitamin, omega-3 fatty acids, OVER THE COUNTER MEDICATION, and valacyclovir. Also has No Known Allergies.  Sheila Nelson  has a past medical history of Allergy, Arthritis, ASTHMA UNSPECIFIED WITH EXACERBATION, Borderline diabetes, Chest pain, Gallstones, GERD (gastroesophageal reflux disease), H/O varicella, Herpes, HTN (hypertension), viral meningitis, Obesity, Pancreatitis, PLANTAR FASCIITIS, RIGHT (09/30/2010), PVC's (premature ventricular contractions), REACTIVE AIRWAY DISEASE (10/09/2009), and TINNITUS (10/09/2009). Also  has a past surgical history that includes Right thigh surgery (1973); Inguinal hernia repair; Dilation and curettage of uterus (2001); Mouth surgery; Cesarean section (06/23/2012); Cardiac catheterization (N/A, 09/17/2015); Cholecystectomy (N/A, 10/05/2016); and Appendectomy (1992).  Objective:   Vitals: BP 120/81   Pulse 83   Temp 97.8 F (36.6 C) (Oral)   Resp 18   Ht 5\' 4"  (1.626 m)   Wt 215 lb 6.4 oz (97.7 kg)   LMP 09/19/2011   SpO2 98%   BMI 36.97 kg/m   BP Readings from Last 3 Encounters:  09/07/17 120/81  08/04/17 133/83  07/26/17 130/82   Physical Exam  Constitutional: She is oriented to person, place, and time. She appears well-developed and well-nourished.    HENT:  Mouth/Throat: Oropharynx is clear and moist.  Eyes: EOM are normal. Pupils are equal, round, and reactive to light.  Cardiovascular: Normal rate, regular rhythm and intact distal pulses. Exam reveals no gallop and no friction rub.  No murmur heard. Pulmonary/Chest: No respiratory distress. She has no wheezes. She has no rales.  Musculoskeletal: She exhibits no edema.  Neurological: She is alert and oriented to person, place, and time. She displays normal reflexes. No cranial nerve deficit. Coordination normal.  Skin: Skin is warm and dry.  Psychiatric: She has a normal mood and affect.   Assessment and Plan :   Essential hypertension  Malaise  Nonintractable headache, unspecified chronicity pattern, unspecified headache type  I discussed possibility that patient is having adverse effects from prednisone. She would like to start hydrochlorothiazide for lower leg swelling. I tried to reassure her that she does not have peripheral edema but agreed to give low dose of HCTZ for her symptoms. Blood pressure monitoring parameters were given. Return-to-clinic precautions discussed, patient verbalized understanding. Patient is to follow up with Dr. Brigitte Pulse.  [name], PA-C [facility] [phone] 09/07/2017  11:56 AM

## 2017-09-07 NOTE — Telephone Encounter (Signed)
Pt reports B/P this am 149/101. States diastolic is >[id number] from baseline. Pt is Therapist, sports. Reports headache 4-5/10  "ears pounding" and both legs and hands swollen, onset this am.  Pt states she just completed prednisone taper.  On Norvasc 5 mg qd; reports she "doubled up" on dose this am; note B/P reading was prior to taking medication.  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  Appt made M.Mani, PA-C. Care advice given.  Reason for Disposition . Systolic BP  >= 540 OR Diastolic >= 981  Protocols used: HIGH BLOOD PRESSURE-A-AH

## 2017-09-08 ENCOUNTER — Telehealth: Payer: Self-pay | Admitting: Family Medicine

## 2017-09-08 DIAGNOSIS — R202 Paresthesia of skin: Secondary | ICD-10-CM

## 2017-09-08 DIAGNOSIS — M47812 Spondylosis without myelopathy or radiculopathy, cervical region: Secondary | ICD-10-CM | POA: Diagnosis not present

## 2017-09-08 DIAGNOSIS — I10 Essential (primary) hypertension: Secondary | ICD-10-CM | POA: Diagnosis not present

## 2017-09-08 DIAGNOSIS — G8194 Hemiplegia, unspecified affecting left nondominant side: Secondary | ICD-10-CM | POA: Diagnosis not present

## 2017-09-08 DIAGNOSIS — Z6836 Body mass index (BMI) 36.0-36.9, adult: Secondary | ICD-10-CM | POA: Diagnosis not present

## 2017-09-08 MED ORDER — BACLOFEN 10 MG PO TABS: 10.0000 mg | ORAL_TABLET | Freq: Four times a day (QID) | ORAL | 0 refills | Status: DC

## 2017-09-08 MED ORDER — GABAPENTIN 400 MG PO CAPS: 400.0000 mg | ORAL_CAPSULE | Freq: Every day | ORAL | 0 refills | Status: DC

## 2017-09-08 NOTE — Telephone Encounter (Signed)
Pt was seen at [facility] today for her initial appt who agreed that her c-spine MRI results do not show explanation for numbness and weakness of her left arm, now having numbness of right leg as well. No pain as with sciatica. Has never had any similar sxs prior.

## 2017-09-08 NOTE — Telephone Encounter (Signed)
On Thursday 1/3, pt contacted me and let me know her sxs were much worse that day. Her left arm was very weak/panful with spasms as well as neck/left shoulder/left mid back.  She can't function on flexeril but can't function with symptoms.  Even flexeril 5mg  makes her feel hungover the next morning but last night she took 10mg  due to severity of sxs. Taking ibuprofen 800mg  tid but worried it will increase her BP.  Also arm numbness, tingling pretty much constant. Her appt with [facility] is Wed 1/9. She has a massage sched for tomorrow. Pt was thinking about going to the ED due to the severity of sxs.  She can't turn her head to the left w/o her whole arm getting numb.  Flat on her back is the only comfortable position.  Pt stated she had very few sxs on the highest dose of prednisone but after she got to the second day on 40mg  sxs started again but were tolerable. wanting to do another round of pred to get her to her appt next week.  Sent in rx for prednisone and robaxin.   Meds ordered this encounter  Medications  . predniSONE (DELTASONE) 20 MG tablet    Sig: Take 4 tabs qd x 2d, then 3 tabs qd x 2d then stop    Dispense:  14 tablet    Refill:  0  . methocarbamol (ROBAXIN) 500 MG tablet    Sig: Take 1-2 tablets (500-1,000 mg total) by mouth 4 (four) times daily.    Dispense:  60 tablet    Refill:  1

## 2017-09-13 ENCOUNTER — Telehealth: Payer: Self-pay | Admitting: Family Medicine

## 2017-09-13 NOTE — Telephone Encounter (Signed)
Pt called checking on status of EMG order. I have faxed this order to [facility] on 1/14. Pt is aware.

## 2017-09-15 ENCOUNTER — Other Ambulatory Visit: Payer: Self-pay

## 2017-09-15 ENCOUNTER — Encounter: Payer: Self-pay | Admitting: Physician Assistant

## 2017-09-15 ENCOUNTER — Ambulatory Visit (INDEPENDENT_AMBULATORY_CARE_PROVIDER_SITE_OTHER): Payer: BLUE CROSS/BLUE SHIELD | Admitting: Physician Assistant

## 2017-09-15 VITALS — BP 132/88 | HR 94 | Temp 99.4°F | Resp 18 | Ht 65.75 in | Wt 215.8 lb

## 2017-09-15 DIAGNOSIS — J101 Influenza due to other identified influenza virus with other respiratory manifestations: Secondary | ICD-10-CM | POA: Diagnosis not present

## 2017-09-15 DIAGNOSIS — R52 Pain, unspecified: Secondary | ICD-10-CM

## 2017-09-15 LAB — POCT INFLUENZA A/B
INFLUENZA A, POC: POSITIVE — AB
Influenza B, POC: NEGATIVE

## 2017-09-15 MED ORDER — OSELTAMIVIR PHOSPHATE 75 MG PO CAPS: 75.0000 mg | ORAL_CAPSULE | Freq: Two times a day (BID) | ORAL | 0 refills | Status: DC

## 2017-09-15 MED FILL — OSELTAMIVIR PHOSPHATE 75 MG: 75 | 5 days supply | Qty: 10 | Fill #0

## 2017-09-15 NOTE — Progress Notes (Signed)
MRN: [id number] DOB: 1960-12-02  Subjective:   Sheila Nelson is a 57 y.o. female presenting for chief complaint of Generalized Body Aches (X 1 day); Cough (X today- pt states with headache); and Sore Throat (X 1 day) .  Reports 1 day history of sudden onset body aches, fever,headache, sore throat, dry cough, and runny nose.  Denies  sinus pain, ear pain, wheezing, shortness of breath, chest pain, chills, nausea, vomiting, abdominal pain and diarrhea. Has had sick contact with husband, who has got sick around the same time. Works as Therapist, sports.  Has history of seasonal allergies and asthma. Patient has had flu shot this season. Denies smoking.  Denies any other aggravating or relieving factors, no other questions or concerns.  [name] has a current medication list which includes the following prescription(s): amlodipine, baclofen, gabapentin, ibuprofen, loratadine, methocarbamol, multiple vitamin, OVER THE COUNTER MEDICATION, valacyclovir, cyclobenzaprine, hydrochlorothiazide, and omega-3 fatty acids. Also has No Known Allergies.  [name]  has a past medical history of Allergy, Arthritis, ASTHMA UNSPECIFIED WITH EXACERBATION, Borderline diabetes, Chest pain, Gallstones, GERD (gastroesophageal reflux disease), H/O varicella, Herpes, HTN (hypertension), viral meningitis, Obesity, Pancreatitis, PLANTAR FASCIITIS, RIGHT (09/30/2010), PVC's (premature ventricular contractions), REACTIVE AIRWAY DISEASE (10/09/2009), and TINNITUS (10/09/2009). Also  has a past surgical history that includes Right thigh surgery (1973); Inguinal hernia repair; Dilation and curettage of uterus (2001); Mouth surgery; Cesarean section (06/23/2012); Cardiac catheterization (N/A, 09/17/2015); Cholecystectomy (N/A, 10/05/2016); and Appendectomy (1992).   Objective:   Vitals: BP 132/88 (BP Location: Left Arm, Patient Position: Sitting, Cuff Size: Normal)   Pulse 94   Temp 99.4 F (37.4 C) (Oral)   Resp 18   Ht 5' 5.75" (1.67 m)   Wt  215 lb 12.8 oz (97.9 kg)   LMP 09/19/2011   SpO2 96%   BMI 35.10 kg/m   Physical Exam  Constitutional: She is oriented to person, place, and time. She appears well-developed and well-nourished. She appears distressed (appears like she does not feel well sitting on exam ).  HENT:  Head: Normocephalic and atraumatic.  Nose: Mucosal edema and rhinorrhea present. Right sinus exhibits no maxillary sinus tenderness and no frontal sinus tenderness. Left sinus exhibits no maxillary sinus tenderness and no frontal sinus tenderness.  Eyes: Conjunctivae are normal.  Neck: Normal range of motion.  Pulmonary/Chest: Effort normal and breath sounds normal. She has no wheezes. She has no rhonchi. She has no rales.  Lymphadenopathy:       Head (right side): No submental, no submandibular, no tonsillar, no preauricular, no posterior auricular and no occipital adenopathy present.       Head (left side): No submental, no submandibular, no tonsillar, no preauricular, no posterior auricular and no occipital adenopathy present.    She has no cervical adenopathy.       Right: No supraclavicular adenopathy present.       Left: No supraclavicular adenopathy present.  Neurological: She is alert and oriented to person, place, and time.  Skin: Skin is warm and dry.  Skin is very warm to the touch.   Psychiatric: She has a normal mood and affect.  Vitals reviewed.   Results for orders placed or performed in visit on 09/15/17 (from the past 24 hour(s))  POCT Influenza A/B     Status: Abnormal   Collection Time: 09/15/17  2:52 PM  Result Value Ref Range   Influenza A, POC Positive (A) Negative   Influenza B, POC Negative Negative    Assessment and Plan :  1. Generalized body aches - POCT Influenza A/B 2. Influenza A POCT positive for influenza A. Given educational material for influenza. Recommend OTC antipyretic, rest, oral hydration, and eat light meals. Given strict return precautions.  - oseltamivir  (TAMIFLU) 75 MG capsule; Take 1 capsule (75 mg total) by mouth 2 (two) times daily.  Dispense: 10 capsule; Refill: 0  Tenna Delaine, PA-C  [facility] 09/15/2017 3:09 PM

## 2017-09-15 NOTE — Progress Notes (Signed)
10-4

## 2017-09-15 NOTE — Patient Instructions (Addendum)
You have tested positive for the flu, you are contagious until you are fever free for 24 hours without using tylenol or ibuprofen.Please stay out of work until you are no longer contagious. Two major complications after the flu are pneumonia and sinus infections. Please be aware of this and if you are not any better in 7-10 days or you develop worsening cough or sinus pressure, seek care at our clinic or the ED. Continue to wash your hands and wear a mask daily especially around other people.   Influenza, Adult Influenza ("the flu") is an infection in the lungs, nose, and throat (respiratory tract). It is caused by a virus. The flu causes many common cold symptoms, as well as a high fever and body aches. It can make you feel very sick. The flu spreads easily from person to person (is contagious). Getting a flu shot (influenza vaccination) every year is the best way to prevent the flu. Follow these instructions at home:  Take over-the-counter and prescription medicines only as told by your doctor.  Use a cool mist humidifier to add moisture (humidity) to the air in your home. This can make it easier to breathe.  Rest as needed.  Drink enough fluid to keep your pee (urine) clear or pale yellow.  Cover your mouth and nose when you cough or sneeze.  Wash your hands with soap and water often, especially after you cough or sneeze. If you cannot use soap and water, use hand sanitizer.  Stay home from work or school as told by your doctor. Unless you are visiting your doctor, try to avoid leaving home until your fever has been gone for 24 hours without the use of medicine.  Keep all follow-up visits as told by your doctor. This is important. How is this prevented?  Getting a yearly (annual) flu shot is the best way to avoid getting the flu. You may get the flu shot in late summer, fall, or winter. Ask your doctor when you should get your flu shot.  Wash your hands often or use hand sanitizer  often.  Avoid contact with people who are sick during cold and flu season.  Eat healthy foods.  Drink plenty of fluids.  Get enough sleep.  Exercise regularly. Contact a doctor if:  You get new symptoms.  You have: ? Chest pain. ? Watery poop (diarrhea). ? A fever.  Your cough gets worse.  You start to have more mucus.  You feel sick to your stomach (nauseous).  You throw up (vomit). Get help right away if:  You start to be short of breath or have trouble breathing.  Your skin or nails turn a bluish color.  You have very bad pain or stiffness in your neck.  You get a sudden headache.  You get sudden pain in your face or ear.  You cannot stop throwing up. This information is not intended to replace advice given to you by your health care provider. Make sure you discuss any questions you have with your health care provider. Document Released: 05/26/2008 Document Revised: 01/23/2016 Document Reviewed: 06/11/2015 Elsevier Interactive Patient Education  2017 [employer].  IF you received an x-ray today, you will receive an invoice from [facility]. Please contact [facility] at [phone] with questions or concerns regarding your invoice.   IF you received labwork today, you will receive an invoice from [place]. Please contact [facility] at [phone] with questions or concerns regarding your invoice.   Our billing staff will not  be able to assist you with questions regarding bills from these companies.  You will be contacted with the lab results as soon as they are available. The fastest way to get your results is to activate your My Chart account. Instructions are located on the last page of this paperwork. If you have not heard from [place] regarding the results in 2 weeks, please contact this office.

## 2017-10-01 ENCOUNTER — Encounter: Payer: BLUE CROSS/BLUE SHIELD | Admitting: Neurology

## 2017-10-14 DIAGNOSIS — F4322 Adjustment disorder with anxiety: Secondary | ICD-10-CM | POA: Diagnosis not present

## 2017-10-20 DIAGNOSIS — Z319 Encounter for procreative management, unspecified: Secondary | ICD-10-CM | POA: Diagnosis not present

## 2017-10-22 ENCOUNTER — Encounter: Payer: BLUE CROSS/BLUE SHIELD | Admitting: Neurology

## 2017-10-27 DIAGNOSIS — F4322 Adjustment disorder with anxiety: Secondary | ICD-10-CM | POA: Diagnosis not present

## 2017-11-04 DIAGNOSIS — F4322 Adjustment disorder with anxiety: Secondary | ICD-10-CM | POA: Diagnosis not present

## 2017-11-25 DIAGNOSIS — F4322 Adjustment disorder with anxiety: Secondary | ICD-10-CM | POA: Diagnosis not present

## 2017-12-09 DIAGNOSIS — F4322 Adjustment disorder with anxiety: Secondary | ICD-10-CM | POA: Diagnosis not present

## 2017-12-15 ENCOUNTER — Telehealth: Payer: Self-pay | Admitting: Family Medicine

## 2017-12-15 MED ORDER — AMLODIPINE BESYLATE 5 MG PO TABS: 5.0000 mg | ORAL_TABLET | Freq: Every day | ORAL | 0 refills | Status: DC

## 2017-12-15 MED FILL — AMLODIPINE BESYLATE 5 MG TA: 5 | 30 days supply | Qty: 30 | Fill #0

## 2017-12-15 NOTE — Telephone Encounter (Signed)
Copied from [place]. Topic: Quick Communication - Rx Refill/Question >> Dec 15, 2017 11:20 AM Margot Ables wrote: Medication: amLODipine (NORVASC) 5 MG tablet  - pt has 6 days left - called to schedule appt for 12/31/17 Has the patient contacted their pharmacy? No bc she was calling to schedule appt with Dr. Brigitte Pulse Preferred Pharmacy (with phone number or street name): [place]

## 2017-12-15 NOTE — Telephone Encounter (Signed)
Amlodipine request; last refill 12/19/16; # 90; no refills Last office visit 09/07/17; was to return in 1 wk. for hypertension; did not f/u. OV: 09/15/17; acute PCP: Dr. Brigitte Pulse  Pharmacy: [facility] 30 day refill per protocol.

## 2017-12-16 DIAGNOSIS — F4322 Adjustment disorder with anxiety: Secondary | ICD-10-CM | POA: Diagnosis not present

## 2017-12-28 MED ORDER — AMLODIPINE BESYLATE 5 MG PO TABS: 5.0000 mg | ORAL_TABLET | Freq: Every day | ORAL | 1 refills | Status: DC

## 2017-12-28 NOTE — Addendum Note (Signed)
Addended by: Shawnee Knapp on: 12/28/2017 10:28 PM   Modules accepted: Orders

## 2017-12-28 NOTE — Telephone Encounter (Signed)
Pt is checking BP outside of office - ok to refill w/o OV - please call pt and let her know that I sent in 6 mo refill so if she would prefer to reschedule her visit for later that is fine. ALso, she is likely due for a CPE - she can reschedule her current appt for a CPE at a different date/time that is more convenient for her OR we can do that at the time of her scheduled appt on 5/3 at 3:40 pm - if prefers the latter, then she should come in early that morning for fasting labs or she can come back anytime in the next few weeks for lab only visit for fasting labs - let me know if she wants them drawn that morning so that I can order them.

## 2017-12-30 DIAGNOSIS — F4322 Adjustment disorder with anxiety: Secondary | ICD-10-CM | POA: Diagnosis not present

## 2017-12-31 ENCOUNTER — Ambulatory Visit: Payer: BLUE CROSS/BLUE SHIELD | Admitting: Family Medicine

## 2018-01-05 ENCOUNTER — Telehealth: Payer: Self-pay | Admitting: Family Medicine

## 2018-01-05 DIAGNOSIS — R7303 Prediabetes: Secondary | ICD-10-CM

## 2018-01-05 DIAGNOSIS — Z832 Family history of diseases of the blood and blood-forming organs and certain disorders involving the immune mechanism: Secondary | ICD-10-CM

## 2018-01-05 DIAGNOSIS — I10 Essential (primary) hypertension: Secondary | ICD-10-CM

## 2018-01-05 DIAGNOSIS — R600 Localized edema: Secondary | ICD-10-CM

## 2018-01-05 DIAGNOSIS — R233 Spontaneous ecchymoses: Secondary | ICD-10-CM

## 2018-01-05 DIAGNOSIS — E6609 Other obesity due to excess calories: Secondary | ICD-10-CM

## 2018-01-05 DIAGNOSIS — Z6835 Body mass index (BMI) 35.0-35.9, adult: Secondary | ICD-10-CM

## 2018-01-05 DIAGNOSIS — R5381 Other malaise: Secondary | ICD-10-CM

## 2018-01-05 MED ORDER — CLONIDINE HCL 0.1 MG PO TABS: 0.1000 mg | ORAL_TABLET | Freq: Every day | ORAL | 0 refills | Status: DC

## 2018-01-05 NOTE — Telephone Encounter (Signed)
Received call from pt. She has had a really stressful week. Lots of home drama and was also ill but make very sure to only take coracidine HBP very sparingly and no other otc cough/cold meds. Has been very compliant w/ BP meds. Noticed her legs were a little puffy yesterday and BP 130s/80s so sched appt to see me on 5/22 to consider adding in another BP med in addition to the amlodipine 5 - interested in clonidine 0.1 qhs as hoping it will also help her sleep and no concerns about compliance.  She was sitting in a workshop most of the day yesterday. Then o/n she woke up at 2 am feeling horrible with a HA, BP was 150s/90s, then went up to 160/100 in the a.m.  Then this afternoon she noticed a fine petechial rash over both lower legs from ankle to mid-calf - feet spared, both still puffy, left tender. No erythema or warmth. She took an hctz 12.5 which didn't really seem to help and mowed the lawn which was not physically stressful or strenuous for her at all. Severe bilateral varicose veins but no prior h/o petechaie. Mother had some crazy clotting d/o with recurrent DVTs but never diagnosed. Father w/ CVA. Sister with sudden death - poss PE so would like to check a d-dimer - understands that emergent [place] may be necessary despite her conflicting sched if lab is +.   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    Pt will come in for lab only visit fasting tomorrrow a.m. (thurs 5/9) (requests routine pre-DM, HTN labs w/ lipid and a1c be checked at same time). Future orders placed. Start clonidine qhs.  SCHEDULERS - PLEASE BOOK PT OFFICE VISIT W/ ME ON Saturday 5/11 AT 8:40 FOR "HTN, EDEMA, RASH"- OK TO USE SAME DAY APPOINTMENT SLOT  - ONLY NEED TO CALL PT IF WE ARE UNABLE TO GIVE HER THIS SLOT. [place]!!!

## 2018-01-06 ENCOUNTER — Telehealth: Payer: Self-pay | Admitting: Family Medicine

## 2018-01-06 ENCOUNTER — Ambulatory Visit (INDEPENDENT_AMBULATORY_CARE_PROVIDER_SITE_OTHER): Payer: BLUE CROSS/BLUE SHIELD | Admitting: Family Medicine

## 2018-01-06 DIAGNOSIS — Z6835 Body mass index (BMI) 35.0-35.9, adult: Secondary | ICD-10-CM | POA: Diagnosis not present

## 2018-01-06 DIAGNOSIS — I10 Essential (primary) hypertension: Secondary | ICD-10-CM | POA: Diagnosis not present

## 2018-01-06 DIAGNOSIS — Z1159 Encounter for screening for other viral diseases: Secondary | ICD-10-CM

## 2018-01-06 DIAGNOSIS — R5381 Other malaise: Secondary | ICD-10-CM

## 2018-01-06 DIAGNOSIS — R600 Localized edema: Secondary | ICD-10-CM | POA: Diagnosis not present

## 2018-01-06 DIAGNOSIS — F4322 Adjustment disorder with anxiety: Secondary | ICD-10-CM | POA: Diagnosis not present

## 2018-01-06 DIAGNOSIS — E66812 Obesity, class 2: Secondary | ICD-10-CM

## 2018-01-06 DIAGNOSIS — R233 Spontaneous ecchymoses: Secondary | ICD-10-CM

## 2018-01-06 DIAGNOSIS — Z113 Encounter for screening for infections with a predominantly sexual mode of transmission: Secondary | ICD-10-CM

## 2018-01-06 DIAGNOSIS — Z832 Family history of diseases of the blood and blood-forming organs and certain disorders involving the immune mechanism: Secondary | ICD-10-CM

## 2018-01-06 DIAGNOSIS — E6609 Other obesity due to excess calories: Secondary | ICD-10-CM

## 2018-01-06 DIAGNOSIS — R7303 Prediabetes: Secondary | ICD-10-CM | POA: Diagnosis not present

## 2018-01-06 LAB — HEMOGLOBIN A1C

## 2018-01-06 LAB — CBC WITH DIFFERENTIAL/PLATELET

## 2018-01-06 LAB — LIPID PANEL

## 2018-01-06 LAB — PROTIME-INR

## 2018-01-06 LAB — COMPREHENSIVE METABOLIC PANEL

## 2018-01-06 LAB — APTT

## 2018-01-06 LAB — D-DIMER, QUANTITATIVE (NOT AT ARMC): D-DIMER: 0.31 mg{FEU}/L (ref 0.00–0.49)

## 2018-01-06 LAB — SEDIMENTATION RATE

## 2018-01-06 LAB — C-REACTIVE PROTEIN

## 2018-01-06 LAB — SPECIMEN STATUS REPORT

## 2018-01-06 NOTE — Telephone Encounter (Signed)
FYI - normal result.

## 2018-01-06 NOTE — Telephone Encounter (Signed)
Stat   Dimer: 0.31- WNL

## 2018-01-07 LAB — CBC WITH DIFFERENTIAL/PLATELET
Basophils Absolute: 0 10*3/uL (ref 0.0–0.2)
Basos: 0 %
EOS (ABSOLUTE): 0.2 10*3/uL (ref 0.0–0.4)
Eos: 4 %
HEMATOCRIT: 39.2 % (ref 34.0–46.6)
HEMOGLOBIN: 13.2 g/dL (ref 11.1–15.9)
IMMATURE GRANULOCYTES: 0 %
Immature Grans (Abs): 0 10*3/uL (ref 0.0–0.1)
Lymphocytes Absolute: 2 10*3/uL (ref 0.7–3.1)
Lymphs: 44 %
MCH: 30.6 pg (ref 26.6–33.0)
MCHC: 33.7 g/dL (ref 31.5–35.7)
MCV: 91 fL (ref 79–97)
MONOS ABS: 0.4 10*3/uL (ref 0.1–0.9)
Monocytes: 8 %
Neutrophils Absolute: 2.1 10*3/uL (ref 1.4–7.0)
Neutrophils: 44 %
Platelets: 275 10*3/uL (ref 150–379)
RBC: 4.32 x10E6/uL (ref 3.77–5.28)
RDW: 12.5 % (ref 12.3–15.4)
WBC: 4.7 10*3/uL (ref 3.4–10.8)

## 2018-01-07 LAB — COMPREHENSIVE METABOLIC PANEL
A/G RATIO: 2 (ref 1.2–2.2)
ALBUMIN: 4.8 g/dL (ref 3.5–5.5)
ALT: 28 IU/L (ref 0–32)
AST: 26 IU/L (ref 0–40)
Alkaline Phosphatase: 102 IU/L (ref 39–117)
BUN / CREAT RATIO: 15 (ref 9–23)
BUN: 13 mg/dL (ref 6–24)
Bilirubin Total: 0.8 mg/dL (ref 0.0–1.2)
CO2: 23 mmol/L (ref 20–29)
CREATININE: 0.89 mg/dL (ref 0.57–1.00)
Calcium: 9.7 mg/dL (ref 8.7–10.2)
Chloride: 101 mmol/L (ref 96–106)
GFR calc Af Amer: 85 mL/min/{1.73_m2} (ref >59)
GFR calc non Af Amer: 74 mL/min/{1.73_m2} (ref >59)
GLOBULIN, TOTAL: 2.4 g/dL (ref 1.5–4.5)
Glucose: 113 mg/dL — ABNORMAL HIGH (ref 65–99)
POTASSIUM: 4 mmol/L (ref 3.5–5.2)
SODIUM: 140 mmol/L (ref 134–144)
Total Protein: 7.2 g/dL (ref 6.0–8.5)

## 2018-01-07 LAB — PROTIME-INR
INR: 1 (ref 0.8–1.2)
PROTHROMBIN TIME: 10 s (ref 9.1–12.0)

## 2018-01-07 LAB — LIPID PANEL
CHOL/HDL RATIO: 2.5 ratio (ref 0.0–4.4)
Cholesterol, Total: 206 mg/dL — ABNORMAL HIGH (ref 100–199)
HDL: 83 mg/dL (ref >39)
LDL CALC: 102 mg/dL — AB (ref 0–99)
Triglycerides: 104 mg/dL (ref 0–149)
VLDL Cholesterol Cal: 21 mg/dL (ref 5–40)

## 2018-01-07 LAB — SEDIMENTATION RATE: Sed Rate: 20 mm/hr (ref 0–40)

## 2018-01-07 LAB — D-DIMER, QUANTITATIVE: D-DIMER: 0.3 mg/L FEU (ref 0.00–0.49)

## 2018-01-07 LAB — HEMOGLOBIN A1C
Est. average glucose Bld gHb Est-mCnc: 120 mg/dL
HEMOGLOBIN A1C: 5.8 % — AB (ref 4.8–5.6)

## 2018-01-07 LAB — APTT: aPTT: 28 s (ref 24–33)

## 2018-01-07 LAB — C-REACTIVE PROTEIN: CRP: 5.1 mg/L — ABNORMAL HIGH (ref 0.0–4.9)

## 2018-01-08 ENCOUNTER — Ambulatory Visit (INDEPENDENT_AMBULATORY_CARE_PROVIDER_SITE_OTHER): Payer: BLUE CROSS/BLUE SHIELD | Admitting: Family Medicine

## 2018-01-08 ENCOUNTER — Encounter: Payer: Self-pay | Admitting: Family Medicine

## 2018-01-08 ENCOUNTER — Other Ambulatory Visit: Payer: Self-pay

## 2018-01-08 VITALS — BP 118/86 | HR 92 | Temp 98.0°F | Resp 20 | Ht 64.21 in | Wt 215.8 lb

## 2018-01-08 DIAGNOSIS — R252 Cramp and spasm: Secondary | ICD-10-CM

## 2018-01-08 DIAGNOSIS — Z832 Family history of diseases of the blood and blood-forming organs and certain disorders involving the immune mechanism: Secondary | ICD-10-CM | POA: Diagnosis not present

## 2018-01-08 DIAGNOSIS — R5381 Other malaise: Secondary | ICD-10-CM | POA: Diagnosis not present

## 2018-01-08 DIAGNOSIS — Z6835 Body mass index (BMI) 35.0-35.9, adult: Secondary | ICD-10-CM | POA: Diagnosis not present

## 2018-01-08 DIAGNOSIS — R7303 Prediabetes: Secondary | ICD-10-CM

## 2018-01-08 DIAGNOSIS — Z124 Encounter for screening for malignant neoplasm of cervix: Secondary | ICD-10-CM

## 2018-01-08 DIAGNOSIS — Z1231 Encounter for screening mammogram for malignant neoplasm of breast: Secondary | ICD-10-CM | POA: Diagnosis not present

## 2018-01-08 DIAGNOSIS — R233 Spontaneous ecchymoses: Secondary | ICD-10-CM

## 2018-01-08 DIAGNOSIS — Z1211 Encounter for screening for malignant neoplasm of colon: Secondary | ICD-10-CM

## 2018-01-08 DIAGNOSIS — R600 Localized edema: Secondary | ICD-10-CM

## 2018-01-08 DIAGNOSIS — I10 Essential (primary) hypertension: Secondary | ICD-10-CM

## 2018-01-08 DIAGNOSIS — Z0000 Encounter for general adult medical examination without abnormal findings: Secondary | ICD-10-CM | POA: Diagnosis not present

## 2018-01-08 DIAGNOSIS — E6609 Other obesity due to excess calories: Secondary | ICD-10-CM | POA: Diagnosis not present

## 2018-01-08 DIAGNOSIS — Z1212 Encounter for screening for malignant neoplasm of rectum: Secondary | ICD-10-CM

## 2018-01-08 MED ORDER — VERAPAMIL HCL ER 180 MG PO TBCR: 180.0000 mg | EXTENDED_RELEASE_TABLET | Freq: Every day | ORAL | 0 refills | Status: DC

## 2018-01-08 NOTE — Patient Instructions (Addendum)
Don't forget to schedule your Mammogram this week!   IF you received an x-ray today, you will receive an invoice from [facility]. Please contact [facility] at [phone] with questions or concerns regarding your invoice.   IF you received labwork today, you will receive an invoice from [place]. Please contact [facility] at [phone] with questions or concerns regarding your invoice.   Our billing staff will not be able to assist you with questions regarding bills from these companies.  You will be contacted with the lab results as soon as they are available. The fastest way to get your results is to activate your My Chart account. Instructions are located on the last page of this paperwork. If you have not heard from [place] regarding the results in 2 weeks, please contact this office.      DASH Eating Plan DASH stands for "Dietary Approaches to Stop Hypertension." The DASH eating plan is a healthy eating plan that has been shown to reduce high blood pressure (hypertension). It may also reduce your risk for type 2 diabetes, heart disease, and stroke. The DASH eating plan may also help with weight loss. What are tips for following this plan? General guidelines  Avoid eating more than 2,300 mg (milligrams) of salt (sodium) a day. If you have hypertension, you may need to reduce your sodium intake to 1,500 mg a day.  Limit alcohol intake to no more than 1 drink a day for nonpregnant women and 2 drinks a day for men. One drink equals 12 oz of beer, 5 oz of wine, or 1 oz of hard liquor.  Work with your health care provider to maintain a healthy body weight or to lose weight. Ask what an ideal weight is for you.  Get at least 30 minutes of exercise that causes your heart to beat faster (aerobic exercise) most days of the week. Activities may include walking, swimming, or biking.  Work with your health care provider or diet and nutrition specialist (dietitian) to adjust your  eating plan to your individual calorie needs. Reading food labels  Check food labels for the amount of sodium per serving. Choose foods with less than 5 percent of the Daily Value of sodium. Generally, foods with less than 300 mg of sodium per serving fit into this eating plan.  To find whole grains, look for the word "whole" as the first word in the ingredient list. Shopping  Buy products labeled as "low-sodium" or "no salt added."  Buy fresh foods. Avoid canned foods and premade or frozen meals. Cooking  Avoid adding salt when cooking. Use salt-free seasonings or herbs instead of table salt or sea salt. Check with your health care provider or pharmacist before using salt substitutes.  Do not fry foods. Cook foods using healthy methods such as baking, boiling, grilling, and broiling instead.  Cook with heart-healthy oils, such as olive, canola, soybean, or sunflower oil. Meal planning   Eat a balanced diet that includes: ? 5 or more servings of fruits and vegetables each day. At each meal, try to fill half of your plate with fruits and vegetables. ? Up to 6-8 servings of whole grains each day. ? Less than 6 oz of lean meat, poultry, or fish each day. A 3-oz serving of meat is about the same size as a deck of cards. One egg equals 1 oz. ? 2 servings of low-fat dairy each day. ? A serving of nuts, seeds, or beans 5 times each week. ? Heart-healthy fats.  Healthy fats called Omega-3 fatty acids are found in foods such as flaxseeds and coldwater fish, like sardines, salmon, and mackerel.  Limit how much you eat of the following: ? Canned or prepackaged foods. ? Food that is high in trans fat, such as fried foods. ? Food that is high in saturated fat, such as fatty meat. ? Sweets, desserts, sugary drinks, and other foods with added sugar. ? Full-fat dairy products.  Do not salt foods before eating.  Try to eat at least 2 vegetarian meals each week.  Eat more home-cooked food and  less restaurant, buffet, and fast food.  When eating at a restaurant, ask that your food be prepared with less salt or no salt, if possible. What foods are recommended? The items listed may not be a complete list. Talk with your dietitian about what dietary choices are best for you. Grains Whole-grain or whole-wheat bread. Whole-grain or whole-wheat pasta. Brown rice. [place]. Bulgur. Whole-grain and low-sodium cereals. Pita bread. Low-fat, low-sodium crackers. Whole-wheat flour tortillas. Vegetables Fresh or frozen vegetables (raw, steamed, roasted, or grilled). Low-sodium or reduced-sodium tomato and vegetable juice. Low-sodium or reduced-sodium tomato sauce and tomato paste. Low-sodium or reduced-sodium canned vegetables. Fruits All fresh, dried, or frozen fruit. Canned fruit in natural juice (without added sugar). Meat and other protein foods Skinless chicken or [place]. Ground chicken or [place]. Pork with fat trimmed off. Fish and seafood. Egg whites. Dried beans, peas, or lentils. Unsalted nuts, nut butters, and seeds. Unsalted canned beans. Lean cuts of beef with fat trimmed off. Low-sodium, lean deli meat. Dairy Low-fat (1%) or fat-free (skim) milk. Fat-free, low-fat, or reduced-fat cheeses. Nonfat, low-sodium ricotta or cottage cheese. Low-fat or nonfat yogurt. Low-fat, low-sodium cheese. Fats and oils Soft margarine without trans fats. Vegetable oil. Low-fat, reduced-fat, or light mayonnaise and salad dressings (reduced-sodium). Canola, safflower, olive, soybean, and sunflower oils. Avocado. Seasoning and other foods Herbs. Spices. Seasoning mixes without salt. Unsalted popcorn and pretzels. Fat-free sweets. What foods are not recommended? The items listed may not be a complete list. Talk with your dietitian about what dietary choices are best for you. Grains Baked goods made with fat, such as croissants, muffins, or some breads. Dry pasta or rice meal  packs. Vegetables Creamed or fried vegetables. Vegetables in a cheese sauce. Regular canned vegetables (not low-sodium or reduced-sodium). Regular canned tomato sauce and paste (not low-sodium or reduced-sodium). Regular tomato and vegetable juice (not low-sodium or reduced-sodium). Angie Fava. Olives. Fruits Canned fruit in a light or heavy syrup. Fried fruit. Fruit in cream or butter sauce. Meat and other protein foods Fatty cuts of meat. Ribs. Fried meat. Berniece Salines. Sausage. Bologna and other processed lunch meats. Salami. Fatback. Hotdogs. Bratwurst. Salted nuts and seeds. Canned beans with added salt. Canned or smoked fish. Whole eggs or egg yolks. Chicken or [place] with skin. Dairy Whole or 2% milk, cream, and half-and-half. Whole or full-fat cream cheese. Whole-fat or sweetened yogurt. Full-fat cheese. Nondairy creamers. Whipped toppings. Processed cheese and cheese spreads. Fats and oils Butter. Stick margarine. Lard. Shortening. Ghee. Bacon fat. Tropical oils, such as coconut, palm kernel, or palm oil. Seasoning and other foods Salted popcorn and pretzels. Onion salt, garlic salt, seasoned salt, table salt, and sea salt. Worcestershire sauce. Tartar sauce. Barbecue sauce. Teriyaki sauce. Soy sauce, including reduced-sodium. Steak sauce. Canned and packaged gravies. Fish sauce. Oyster sauce. Cocktail sauce. Horseradish that you find on the shelf. Ketchup. Mustard. Meat flavorings and tenderizers. Bouillon cubes. Hot sauce and Tabasco sauce. Premade or  packaged marinades. Premade or packaged taco seasonings. Relishes. Regular salad dressings. Where to find more information:  National Heart, Lung, and [place]: https://wilson-eaton.com/  American Heart Association: www.heart.org Summary  The DASH eating plan is a healthy eating plan that has been shown to reduce high blood pressure (hypertension). It may also reduce your risk for type 2 diabetes, heart disease, and stroke.  With the DASH eating  plan, you should limit salt (sodium) intake to 2,300 mg a day. If you have hypertension, you may need to reduce your sodium intake to 1,500 mg a day.  When on the DASH eating plan, aim to eat more fresh fruits and vegetables, whole grains, lean proteins, low-fat dairy, and heart-healthy fats.  Work with your health care provider or diet and nutrition specialist (dietitian) to adjust your eating plan to your individual calorie needs. This information is not intended to replace advice given to you by your health care provider. Make sure you discuss any questions you have with your health care provider. Document Released: 08/06/2011 Document Revised: 08/10/2016 Document Reviewed: 08/10/2016 Elsevier Interactive Patient Education  2018 [employer].  Exercising to [employer] Exercising can help you to lose weight. In order to lose weight through exercise, you need to do vigorous-intensity exercise. You can tell that you are exercising with vigorous intensity if you are breathing very hard and fast and cannot hold a conversation while exercising. Moderate-intensity exercise helps to maintain your current weight. You can tell that you are exercising at a moderate level if you have a higher heart rate and faster breathing, but you are still able to hold a conversation. How often should I exercise? Choose an activity that you enjoy and set realistic goals. Your health care provider can help you to make an activity plan that works for you. Exercise regularly as directed by your health care provider. This may include:  Doing resistance training twice each week, such as: ? Push-ups. ? Sit-ups. ? Lifting weights. ? Using resistance bands.  Doing a given intensity of exercise for a given amount of time. Choose from these options: ? 150 minutes of moderate-intensity exercise every week. ? 75 minutes of vigorous-intensity exercise every week. ? A mix of moderate-intensity and vigorous-intensity exercise  every week.  Children, pregnant women, people who are out of shape, people who are overweight, and older adults may need to consult a health care provider for individual recommendations. If you have any sort of medical condition, be sure to consult your health care provider before starting a new exercise program. What are some activities that can help me to lose weight?  Walking at a rate of at least 4.5 miles an hour.  Jogging or running at a rate of 5 miles per hour.  Biking at a rate of at least 10 miles per hour.  Lap swimming.  Roller-skating or in-line skating.  Cross-country skiing.  Vigorous competitive sports, such as football, basketball, and soccer.  Jumping rope.  Aerobic dancing. How can I be more active in my day-to-day activities?  Use the stairs instead of the elevator.  Take a walk during your lunch break.  If you drive, park your car farther away from work or school.  If you take public transportation, get off one stop early and walk the rest of the way.  Make all of your phone calls while standing up and walking around.  Get up, stretch, and walk around every 30 minutes throughout the day. What guidelines should I follow while  exercising?  Do not exercise so much that you hurt yourself, feel dizzy, or get very short of breath.  Consult your health care provider prior to starting a new exercise program.  Wear comfortable clothes and shoes with good support.  Drink plenty of water while you exercise to prevent dehydration or heat stroke. Body water is lost during exercise and must be replaced.  Work out until you breathe faster and your heart beats faster. This information is not intended to replace advice given to you by your health care provider. Make sure you discuss any questions you have with your health care provider. Document Released: 09/19/2010 Document Revised: 01/23/2016 Document Reviewed: 01/18/2014 Elsevier Interactive Patient Education   [employer].

## 2018-01-08 NOTE — Progress Notes (Addendum)
Subjective:  By signing my name below, I, [name], attest that this documentation has been prepared under the direction and in the presence of Delman Cheadle, MD Electronically Signed: Ladene Artist, ED Scribe 01/08/2018 at 9:33 AM.   Patient ID: Sheila Nelson, female    DOB: 1960-12-15, 57 y.o.   MRN: [id number]  Chief Complaint  Patient presents with  . Annual Exam   HPI Sheila Nelson is a 57 y.o. female who presents to Primary Care at [facility] for an annual exam.  Primary Preventative Screenings: Cervical Cancer: Family Planning: STI screening: Breast Cancer: Colorectal Cancer: Tobacco use/EtOH/substances: Cardiac: Weight/Blood sugar/Diet/Exercise: Pt has noticed that she is a stress eater; has been taking CBD oil x 2 months which has helped. Also seeing a therapist. She plans to have the lap band placed temporarily to help her lose ~ 40 lbs. BMI Readings from Last 3 Encounters:  01/08/18 36.80 kg/m  09/15/17 35.10 kg/m  09/07/17 36.97 kg/m   Lab Results  Component Value Date   HGBA1C 5.8 (H) 01/06/2018   OTC/Vit/Supp/Herbal: B complex; has noticed numbness/tingling has improved but reports she still has chronic neck pain that she attributes to posture Dentist/Optho: Immunizations:  Immunization History  Administered Date(s) Administered  . Influenza Whole 05/31/2009  . Influenza,inj,Quad PF,6+ Mos 05/10/2017  . Influenza-Unspecified 06/27/2015, 06/14/2016  . Rho (D) Immune Globulin 06/24/2012  . Td 08/31/2008   Varicose Veins: Pt plans to also have varicose veins treated after weight loss as well.   Skin: Family h/o basal cell. She is followed by dermatology for an enlarging area on the chest.  Night Sweats: Pt is postmenopausal. She has recently noticed night sweats which causes her to lose sleep ~ 1 day every other wk. She plans to restart primrose as she is not interested in hormone therapy.  Chronic Medical Conditions: HTN - Pt has been checking her  BP outside of the office with readings of 130/80 with a resting HR of low 60s. Denies HAs or leg swelling but states she does have daily "sock rings".She has taken Lisinopril in the past which caused cough. She has tried labetalol during pregnancy with HR in the 50s but asymptomatic.  Past Medical History:  Diagnosis Date  . Allergy   . Arthritis    feet and ankles  . ASTHMA UNSPECIFIED WITH EXACERBATION   . Borderline diabetes   . Chest pain    a. 09/2015 felt to be non cardiac after LHC with no CAD   . Gallstones   . GERD (gastroesophageal reflux disease)   . H/O varicella   . Herpes   . HTN (hypertension)   . Hx of viral meningitis   . Obesity   . Pancreatitis    Viral  . PLANTAR FASCIITIS, RIGHT 09/30/2010  . PVC's (premature ventricular contractions)   . REACTIVE AIRWAY DISEASE 10/09/2009  . TINNITUS 10/09/2009   Past Surgical History:  Procedure Laterality Date  . APPENDECTOMY  1992  . CARDIAC CATHETERIZATION N/A 09/17/2015   Procedure: Left Heart Cath and Coronary Angiography;  Surgeon: Burnell Blanks, MD;  Location: [place] [facility];  Service: Cardiovascular;  Laterality: N/A;  . CESAREAN SECTION  06/23/2012   Procedure: CESAREAN SECTION;  Surgeon: Lovenia Kim, MD;  Location: [place] [facility];  Service: Obstetrics;  Laterality: N/A;  . CHOLECYSTECTOMY N/A 10/05/2016   Procedure: LAPAROSCOPIC CHOLECYSTECTOMY;  Surgeon: Fanny Skates, MD;  Location: [facility];  Service: General;  Laterality: N/A;  . DILATION AND CURETTAGE OF UTERUS  2001  . INGUINAL HERNIA REPAIR     Infant, bilateral  . MOUTH SURGERY    . Right thigh surgery  1973   Trauma   Current Outpatient Medications on File Prior to Visit  Medication Sig Dispense Refill  . amLODipine (NORVASC) 5 MG tablet Take 1 tablet (5 mg total) by mouth daily. 90 tablet 1  . B Complex Vitamins (VITAMIN B COMPLEX PO) Take by mouth daily.    . fluticasone (FLONASE) 50 MCG/ACT nasal spray Place 1 spray into both nostrils daily.      Marland Kitchen gabapentin (NEURONTIN) 400 MG capsule Take 1 capsule (400 mg total) by mouth at bedtime. X 1 wk, then bid x 1 wk, then tid 90 capsule 0  . hydrochlorothiazide (HYDRODIURIL) 12.5 MG tablet Take 1 tablet (12.5 mg total) by mouth daily. 30 tablet 0  . IBUPROFEN PO Take 600 mg by mouth as needed.     . loratadine (CLARITIN) 10 MG tablet Take 10 mg by mouth daily.    . MULTIPLE VITAMIN PO Take 1 tablet by mouth daily.    . Omega-3 Fatty Acids (OMEGA 3 PO) Take 2 capsules by mouth daily.    . valACYclovir (VALTREX) 1000 MG tablet Take 1 tablet (1,000 mg total) by mouth daily. 30 tablet 11  . cloNIDine (CATAPRES) 0.1 MG tablet Take 1 tablet (0.1 mg total) by mouth at bedtime. (Patient not taking: Reported on 01/08/2018) 90 tablet 0  . [DISCONTINUED] beclomethasone (QVAR) 80 MCG/ACT inhaler Inhale 2 puffs into the lungs 2 (two) times daily. 1 Inhaler 0   No current facility-administered medications on file prior to visit.    No Known Allergies Family History  Problem Relation Age of Onset  . Prostate cancer Father   . Stroke Father 53  . Heart failure Father   . Hypertension Father   . Cancer Father        prostate  . Valvular heart disease Mother        MVR  . Thrombocytopenia Mother   . Heart disease Mother   . Birth defects Sister        tetralogy of falot  . Hypertension Brother   . Prostate cancer Brother   . Stroke Maternal Grandmother   . Hypertension Brother   . Hypertension Brother   . Colon cancer Neg Hx   . Esophageal cancer Neg Hx   . Stomach cancer Neg Hx   . Rectal cancer Neg Hx    Social History   Socioeconomic History  . Marital status: Married    Spouse name: Lambert Mody  . Number of children: 1  . Years of education: 65  . Highest education level: Bachelor's degree (e.g., BA, AB, BS)  Occupational History  . Occupation: Programmer, multimedia: [employer]  . Financial resource strain: Not on file  . Food insecurity:    Worry: Not on file    Inability:  Not on file  . Transportation needs:    Medical: Not on file    Non-medical: Not on file  Tobacco Use  . Smoking status: Never Smoker  . Smokeless tobacco: Never Used  . Tobacco comment: Lives with spouse s/p wedding 07/2010. Employed as RN-pediatric critical care; now [employer]  Substance and Sexual Activity  . Alcohol use: Yes    Comment: 3-5 glasses of wine per week  . Drug use: No  . Sexual activity: Yes    Partners: Male  Lifestyle  . Physical activity:  Days per week: Not on file    Minutes per session: Not on file  . Stress: Not on file  Relationships  . Social connections:    Talks on phone: Not on file    Gets together: Not on file    Attends religious service: Not on file    Active member of club or organization: Not on file    Attends meetings of clubs or organizations: Not on file    Relationship status: Not on file  Other Topics Concern  . Not on file  Social History Narrative   Lives with partner and son.   1-2 cups caffeine per day.   Right-handed.   Depression screen [facility] 2/9 01/08/2018 09/15/2017 07/26/2017 03/16/2017 08/17/2016  Decreased Interest 0 0 0 0 0  Down, Depressed, Hopeless 0 0 - 0 0  PHQ - 2 Score 0 0 0 0 0   Review of Systems  Constitutional: Positive for diaphoresis.  Cardiovascular: Negative for leg swelling.  Musculoskeletal: Positive for neck pain (chronic).  Neurological: Negative for headaches.  Psychiatric/Behavioral: Positive for sleep disturbance (rare).      Objective:   Physical Exam  Constitutional: She is oriented to person, place, and time. She appears well-developed and well-nourished. No distress.  HENT:  Head: Normocephalic and atraumatic.  Eyes: Conjunctivae and EOM are normal.  Neck: Neck supple. No tracheal deviation present.  Cardiovascular: Normal rate.  Pulmonary/Chest: Effort normal. No respiratory distress.  Musculoskeletal: Normal range of motion.  Neurological: She is alert and oriented to person, place, and  time.  Skin: Skin is warm and dry.  Psychiatric: She has a normal mood and affect. Her behavior is normal.  Nursing note and vitals reviewed.  BP 118/86   Pulse 92   Temp 98 F (36.7 C)   Resp 20   Ht 5' 4.21" (1.631 m)   Wt 215 lb 12.8 oz (97.9 kg)   LMP 09/19/2011   SpO2 98%   BMI 36.80 kg/m     Assessment & Plan:   1. Annual physical exam   2. Class 2 obesity due to excess calories without serious comorbidity with body mass index (BMI) of 35.0 to 35.9 in adult - frustrated that no results to efforts - would like some intensive management with diet and exercise - refer to dr. Leafy Ro and team  3. Essential hypertension - pedal edema likely exacerbated by all day sitting at conference, heat of summer starting, and amlodipine 5 - change amlodipine to non-dihydropyridine ccb like verapamil and ok to cont to use prn hctz.  ADDENDUM 7/3: Pt called - on verapamil, systolic BP improved to 220U but DBP in 90s.  One day she took on of the hctz 12.5 (was rx'd 30 6 mos ago) and lost 3.5 lbs in fluid weight as weighing self daily as part of new plan - feels like she is very sensitive to sodium/fluid retention though no sig edema or petechial problems since.   START hctz 12.5 qd in addition to the verapamil. If DBP doesn't decrease to 70s/low80s -> increase to 25 qd.  4. Bilateral lower extremity edema - very reassuring that d-dimer negative  5. Prediabetes - slowly improving - keep up the efforts Lab Results  Component Value Date   HGBA1C 5.8 (H) 01/06/2018   HGBA1C WILL FOLLOW 01/06/2018   HGBA1C 5.9 (H) 06/01/2017   HGBA1C 6.0 08/17/2016    6. Malaise   7. Petechial eruption - resolving - no more new after day of initial  edema which was likely from sitting in conference all day which is unusual for her - likely occurred just from sudden onset edema  8. Family history of clotting disorder - initial cbc, cmp, inr/pt, aptt all normal and no further sxs/signs, no further eval needed at this  time.  9. Muscle cramps   10. Encounter for screening mammogram for breast cancer   11. Screening for cervical cancer   12. Screening for colorectal cancer    Had labs drawn 2 d ago that we reviewed together in detail.   Orders Placed This Encounter  Procedures  . Amb Ref to Medical Weight Management    Referral Priority:   Routine    Referral Type:   Consultation    Referred to Provider:   Starlyn Skeans, MD    Number of Visits Requested:   1    Meds ordered this encounter  Medications  . verapamil (CALAN-SR) 180 MG CR tablet    Sig: Take 1 tablet (180 mg total) by mouth at bedtime.    Dispense:  90 tablet    Refill:  0    I personally performed the services described in this documentation, which was scribed in my presence. The recorded information has been reviewed and considered, and addended by me as needed.   Delman Cheadle, M.D.  Primary Care at [facility] [address] [place], [place] [address] [phone] phone [phone] fax  03/02/18 11:24 AM

## 2018-01-08 NOTE — Progress Notes (Signed)
LAB ONLY VISIT. NOT SEEN BY PROVIDER.

## 2018-01-10 DIAGNOSIS — Z1159 Encounter for screening for other viral diseases: Secondary | ICD-10-CM | POA: Diagnosis not present

## 2018-01-10 DIAGNOSIS — Z113 Encounter for screening for infections with a predominantly sexual mode of transmission: Secondary | ICD-10-CM | POA: Diagnosis not present

## 2018-01-10 MED FILL — VERAPAMIL ER 180 MG TABLET: 180 | 90 days supply | Qty: 90 | Fill #0

## 2018-01-11 LAB — HEPATITIS C ANTIBODY: Hep C Virus Ab: <0.1 s/co ratio (ref 0.0–0.9)

## 2018-01-19 ENCOUNTER — Ambulatory Visit: Payer: BLUE CROSS/BLUE SHIELD | Admitting: Family Medicine

## 2018-02-10 DIAGNOSIS — F4322 Adjustment disorder with anxiety: Secondary | ICD-10-CM | POA: Diagnosis not present

## 2018-03-02 ENCOUNTER — Encounter: Payer: Self-pay | Admitting: Family Medicine

## 2018-03-02 MED ORDER — HYDROCHLOROTHIAZIDE 12.5 MG PO TABS: 12.5000 mg | ORAL_TABLET | Freq: Every day | ORAL | 1 refills | Status: DC

## 2018-03-02 MED ORDER — VERAPAMIL HCL ER 180 MG PO TBCR: 180.0000 mg | EXTENDED_RELEASE_TABLET | Freq: Every day | ORAL | 1 refills | Status: DC

## 2018-03-10 DIAGNOSIS — F4322 Adjustment disorder with anxiety: Secondary | ICD-10-CM | POA: Diagnosis not present

## 2018-03-14 MED FILL — HYDROCHLOROTHIAZIDE 12.5 MG: 12.5 | 90 days supply | Qty: 90 | Fill #0

## 2018-04-07 DIAGNOSIS — F4322 Adjustment disorder with anxiety: Secondary | ICD-10-CM | POA: Diagnosis not present

## 2018-04-11 ENCOUNTER — Other Ambulatory Visit: Payer: Self-pay | Admitting: Family Medicine

## 2018-04-11 NOTE — Telephone Encounter (Signed)
Copied from [place] [id number]. Topic: Quick Communication - See Telephone Encounter >> Apr 11, 2018 11:21 AM Loma Boston wrote: CRM for notification. See Telephone encounter for: 04/11/18.

## 2018-04-11 NOTE — Telephone Encounter (Signed)
Contacted pt regarding her request; see telephone encounter; she states that her DBP is > 90 since she has been taking Calan-SR 180 mg; she says that she and Dr Brigitte Pulse. She would like to know if she is going to change medication to something else or should she continue the Calan along with HCTZ; the pt says she has 3 doses of Calan left; the pt uses [facility]; the pt is also having episodes of constipation and left lower quadrant pain (she has a history of diverticulitis), could this be related to her blood pressure issues; she would like for Dr Brigitte Pulse to call her at [phone] to discuss these issues; will route office for notification of this encounter; pt was seen in office 01/08/18.

## 2018-04-14 MED ORDER — VERAPAMIL HCL ER 240 MG PO TBCR: 240.0000 mg | EXTENDED_RELEASE_TABLET | Freq: Every day | ORAL | 0 refills | Status: DC

## 2018-04-14 MED FILL — VERAPAMIL ER 240 MG TABLET: 240 | 30 days supply | Qty: 30 | Fill #0

## 2018-04-14 NOTE — Telephone Encounter (Signed)
Spoke with pt advised sent over new dose of calan from 180 mg to 240 mg for 30 days only with no refills and dr Brigitte Pulse would like for her to come in for an ov same day next week to address llq pain due to diverticulitis hx and will discuss calan dosage change at ov also.  Pt agreeable and placed on *[phone] for Felicia to schedule same day with shaw next week.   Dgaddy, CMA

## 2018-04-14 NOTE — Addendum Note (Signed)
Addended by: Leim Fabry on: 04/14/2018 03:24 PM   Modules accepted: Orders

## 2018-04-18 ENCOUNTER — Other Ambulatory Visit: Payer: Self-pay

## 2018-04-18 ENCOUNTER — Encounter: Payer: Self-pay | Admitting: Family Medicine

## 2018-04-18 ENCOUNTER — Ambulatory Visit (INDEPENDENT_AMBULATORY_CARE_PROVIDER_SITE_OTHER): Payer: BLUE CROSS/BLUE SHIELD | Admitting: Family Medicine

## 2018-04-18 VITALS — BP 118/82 | HR 82 | Temp 98.9°F | Resp 16 | Ht 64.37 in | Wt 187.8 lb

## 2018-04-18 DIAGNOSIS — Z1231 Encounter for screening mammogram for malignant neoplasm of breast: Secondary | ICD-10-CM | POA: Diagnosis not present

## 2018-04-18 DIAGNOSIS — R1032 Left lower quadrant pain: Secondary | ICD-10-CM

## 2018-04-18 DIAGNOSIS — R7303 Prediabetes: Secondary | ICD-10-CM | POA: Diagnosis not present

## 2018-04-18 DIAGNOSIS — Z6831 Body mass index (BMI) 31.0-31.9, adult: Secondary | ICD-10-CM

## 2018-04-18 DIAGNOSIS — E6609 Other obesity due to excess calories: Secondary | ICD-10-CM

## 2018-04-18 DIAGNOSIS — I10 Essential (primary) hypertension: Secondary | ICD-10-CM

## 2018-04-18 LAB — POCT CBC
Granulocyte percent: 59.1 %G (ref 37–80)
HCT, POC: 43.5 % (ref 37.7–47.9)
Hemoglobin: 14.9 g/dL (ref 12.2–16.2)
LYMPH, POC: 1.7 (ref 0.6–3.4)
MCH, POC: 30.3 pg (ref 27–31.2)
MCHC: 34.2 g/dL (ref 31.8–35.4)
MCV: 88.5 fL (ref 80–97)
MID (cbc): 0.3 (ref 0–0.9)
MPV: 8.2 fL (ref 0–99.8)
POC GRANULOCYTE: 3 (ref 2–6.9)
POC LYMPH %: 34.9 % (ref 10–50)
POC MID %: 6 % (ref 0–12)
Platelet Count, POC: 272 10*3/uL (ref 142–424)
RBC: 4.92 M/uL (ref 4.04–5.48)
RDW, POC: 13 %
WBC: 5 10*3/uL (ref 4.6–10.2)

## 2018-04-18 LAB — POCT GLYCOSYLATED HEMOGLOBIN (HGB A1C): Hemoglobin A1C: 5.6 % (ref 4.0–5.6)

## 2018-04-18 LAB — POCT URINALYSIS DIP (MANUAL ENTRY)
Bilirubin, UA: NEGATIVE
Blood, UA: NEGATIVE
Glucose, UA: NEGATIVE mg/dL
Ketones, POC UA: NEGATIVE mg/dL
Nitrite, UA: NEGATIVE
PH UA: 7 (ref 5.0–8.0)
Protein Ur, POC: NEGATIVE mg/dL
Spec Grav, UA: 1.015 (ref 1.010–1.025)
UROBILINOGEN UA: 0.2 U/dL

## 2018-04-18 MED ORDER — OLMESARTAN MEDOXOMIL-HCTZ 20-12.5 MG PO TABS: 1.0000 | ORAL_TABLET | Freq: Every day | ORAL | 3 refills | Status: DC

## 2018-04-18 MED FILL — OLMESARTAN-HCTZ 20-12.5 MG: 20-12.5 | 30 days supply | Qty: 30 | Fill #0

## 2018-04-18 NOTE — Patient Instructions (Signed)
° ° ° °  If you have lab work done today you will be contacted with your lab results within the next 2 weeks.  If you have not heard from us then please contact us. The fastest way to get your results is to register for My Chart. ° ° °IF you received an x-ray today, you will receive an invoice from [facility]. Please contact [facility] at [phone] with questions or concerns regarding your invoice.  ° °IF you received labwork today, you will receive an invoice from [facility]. Please contact [facility] at [phone] with questions or concerns regarding your invoice.  ° °Our billing staff will not be able to assist you with questions regarding bills from these companies. ° °You will be contacted with the lab results as soon as they are available. The fastest way to get your results is to activate your My Chart account. Instructions are located on the last page of this paperwork. If you have not heard from us regarding the results in 2 weeks, please contact this office. °  ° ° ° °

## 2018-04-18 NOTE — Progress Notes (Signed)
Subjective:    Patient ID: Sheila Nelson, female    DOB: 1960-11-25, 57 y.o.   MRN: [id number] Chief Complaint  Patient presents with  . Abdominal Pain    pt states for the last few days she has been having some constipation and disconfort in her abdomin. Pt states she is concerned about Diverticulitis bc she has a history   . Hypertension    HPI HTN - Pt has been checking her BP outside of the office with readings of 130/80 with a resting HR of low 60s on amlodipine 5 but was causing a little pedal edema at times and sock rings when she was more sedentary for work so changed to verapamil and cont to use prn hctz 12.5.  6 wks ago pt noted on verapamil her BP was running 100s/90s so we started hctz 12.5 with plans to increase this is DBP wasn't sig lower.  However, pt called in last wk and was still noting the high DBP but having much more constipation since starting the hctz regularly so we instead increased verapamil from 180 to 240.  Used to be fairly regularly with BM 2x/d but no if goes >2d feels uncomfortable. And gets LLQ cramping.   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 HR has gone down to 48 at [facility] and after a near miss accident she was worked up and HR was only 69  She has taken Lisinopril in the past which caused cough. She has tried labetalol during pregnancy with HR in the 50s but asymptomatic.  Obesity: working on weight loss with noom and has been very successful - has lost 28 lbs since her last visit 3 mos ago!!!!! BMI Readings from Last 5 Encounters:  04/18/18 31.87 kg/m  01/08/18 36.80 kg/m  09/15/17 35.10 kg/m  09/07/17 36.97 kg/m  08/04/17 36.00 kg/m   Past Medical History:  Diagnosis Date  . Allergy   . Arthritis    feet and ankles  . ASTHMA UNSPECIFIED WITH EXACERBATION   . Borderline diabetes   . Chest pain    a. 09/2015 felt to be non cardiac after LHC  with no CAD   . Fasciculation 08/04/2017  . Gallstones   . GERD (gastroesophageal reflux disease)   . H/O varicella   . Herpes   . HTN (hypertension)   . Hx of viral meningitis   . Obesity   . Pancreatitis    Viral  . PLANTAR FASCIITIS, RIGHT 09/30/2010  . PVC's (premature ventricular contractions)   . REACTIVE AIRWAY DISEASE 10/09/2009  . TINNITUS 10/09/2009   Past Surgical History:  Procedure Laterality Date  . APPENDECTOMY  1992  . CARDIAC CATHETERIZATION N/A 09/17/2015   Procedure: Left Heart Cath and Coronary Angiography;  Surgeon: Burnell Blanks, MD;  Location: [place] [facility];  Service: Cardiovascular;  Laterality: N/A;  . CESAREAN SECTION  06/23/2012   Procedure: CESAREAN SECTION;  Surgeon: Lovenia Kim, MD;  Location: [facility];  Service: Obstetrics;  Laterality: N/A;  . CHOLECYSTECTOMY N/A 10/05/2016   Procedure: LAPAROSCOPIC CHOLECYSTECTOMY;  Surgeon: Fanny Skates, MD;  Location: [facility];  Service: General;  Laterality: N/A;  . DILATION AND CURETTAGE OF UTERUS  2001  . INGUINAL HERNIA REPAIR     Infant, bilateral  . MOUTH SURGERY    . Right thigh surgery  1973   Trauma   Current Outpatient Medications on File  Prior to Visit  Medication Sig Dispense Refill  . B Complex Vitamins (VITAMIN B COMPLEX PO) Take by mouth daily.    . IBUPROFEN PO Take 600 mg by mouth as needed.     . MULTIPLE VITAMIN PO Take 1 tablet by mouth daily.    . Omega-3 Fatty Acids (OMEGA 3 PO) Take 2 capsules by mouth daily.    . valACYclovir (VALTREX) 1000 MG tablet Take 1 tablet (1,000 mg total) by mouth daily. 30 tablet 11  . [DISCONTINUED] beclomethasone (QVAR) 80 MCG/ACT inhaler Inhale 2 puffs into the lungs 2 (two) times daily. 1 Inhaler 0   No current facility-administered medications on file prior to visit.    No Known Allergies Family History  Problem Relation Age of Onset  . Prostate cancer Father   . Stroke Father 48  . Heart failure Father   . Hypertension Father   .  Cancer Father        prostate  . Valvular heart disease Mother        MVR  . Thrombocytopenia Mother   . Heart disease Mother   . Birth defects Sister        tetralogy of falot  . Hypertension Brother   . Prostate cancer Brother   . Stroke Maternal Grandmother   . Hypertension Brother   . Hypertension Brother   . Colon cancer Neg Hx   . Esophageal cancer Neg Hx   . Stomach cancer Neg Hx   . Rectal cancer Neg Hx    Social History   Socioeconomic History  . Marital status: Married    Spouse name: Lambert Mody  . Number of children: 1  . Years of education: 71  . Highest education level: Bachelor's degree (e.g., BA, AB, BS)  Occupational History  . Occupation: Programmer, multimedia: [employer]  . Financial resource strain: Not on file  . Food insecurity:    Worry: Not on file    Inability: Not on file  . Transportation needs:    Medical: Not on file    Non-medical: Not on file  Tobacco Use  . Smoking status: Never Smoker  . Smokeless tobacco: Never Used  . Tobacco comment: Lives with spouse s/p wedding 07/2010. Employed as RN-pediatric critical care; now [employer]  Substance and Sexual Activity  . Alcohol use: Yes    Comment: 3-5 glasses of wine per week  . Drug use: No  . Sexual activity: Yes    Partners: Male  Lifestyle  . Physical activity:    Days per week: Not on file    Minutes per session: Not on file  . Stress: Not on file  Relationships  . Social connections:    Talks on phone: Not on file    Gets together: Not on file    Attends religious service: Not on file    Active member of club or organization: Not on file    Attends meetings of clubs or organizations: Not on file    Relationship status: Not on file  Other Topics Concern  . Not on file  Social History Narrative   Lives with partner and son.   1-2 cups caffeine per day.   Right-handed.   Depression screen [facility] 2/9 04/18/2018 01/08/2018 09/15/2017 07/26/2017 03/16/2017  Decreased Interest 0 0 0 0  0  Down, Depressed, Hopeless 0 0 0 - 0  PHQ - 2 Score 0 0 0 0 0     Review of Systems  See hpi    Objective:   Physical Exam  Constitutional: She is oriented to person, place, and time. She appears well-developed and well-nourished. No distress.  HENT:  Head: Normocephalic and atraumatic.  Neck: Normal range of motion. Neck supple. No thyromegaly present.  Cardiovascular: Normal rate, regular rhythm, normal heart sounds and intact distal pulses.  Pulmonary/Chest: Effort normal and breath sounds normal. No respiratory distress.  Abdominal: Soft. Bowel sounds are normal. There is no hepatosplenomegaly. There is no tenderness. There is no rebound, no guarding and no CVA tenderness.  Musculoskeletal: She exhibits no edema.  Lymphadenopathy:    She has no cervical adenopathy.  Neurological: She is alert and oriented to person, place, and time.  Skin: Skin is warm and dry. She is not diaphoretic. No erythema.  Psychiatric: She has a normal mood and affect. Her behavior is normal.   BP 118/82   Pulse 82   Temp 98.9 F (37.2 C) (Oral)   Resp 16   Ht 5' 4.37" (1.635 m)   Wt 187 lb 12.8 oz (85.2 kg)   LMP 09/19/2011   SpO2 97%   BMI 31.87 kg/m      Assessment & Plan:   1. Abdominal pain, left lower quadrant - suspect constipation from BP meds  - esp since starting the verapamil as never had any prior prob when on the hctz- try smooth move tea and or miralax  2. Essential hypertension - d/c ccb as occ pedal edema and constipation, also lowered HR from verapamil is exacerbating fatigue - try arb instead combined with hctz - olmesartan-hctz sent in.  3. Prediabetes - resolved with pt's excellent work at weight loss!!!    4. Encounter for screening mammogram for breast cancer   5.      Class 1 obesity due to excess calories with serious comorbidity and body mass index (BMI) of 31.0 to 31.9 in adult - Has done amazing decreasing BMI from 37+ down to 31 and planning to keep going!!! Using  Noom  Orders Placed This Encounter  Procedures  . MM DIGITAL SCREENING BILATERAL    INS ?? ORDER IN PROF PF ??    Standing Status:   Future    Standing Expiration Date:   06/19/2019    Order Specific Question:   Reason for Exam (SYMPTOM  OR DIAGNOSIS REQUIRED)    Answer:   screening for breast cancer    Order Specific Question:   Is the patient pregnant?    Answer:   No    Order Specific Question:   Preferred imaging location?    Answer:   [facility]  . Comprehensive metabolic panel  . POCT urinalysis dipstick  . POCT CBC  . POCT glycosylated hemoglobin (Hb A1C)    Meds ordered this encounter  Medications  . olmesartan-hydrochlorothiazide (BENICAR HCT) 20-12.5 MG tablet    Sig: Take 1 tablet by mouth daily.    Dispense:  30 tablet    Refill:  3    Delman Cheadle, M.D.  Primary Care at [facility] [address] [place], [place] [address] [phone] phone [phone] fax  05/10/18 4:37 PM

## 2018-04-19 LAB — COMPREHENSIVE METABOLIC PANEL
A/G RATIO: 2 (ref 1.2–2.2)
ALBUMIN: 4.9 g/dL (ref 3.5–5.5)
ALT: 23 IU/L (ref 0–32)
AST: 25 IU/L (ref 0–40)
Alkaline Phosphatase: 116 IU/L (ref 39–117)
BILIRUBIN TOTAL: 0.8 mg/dL (ref 0.0–1.2)
BUN / CREAT RATIO: 19 (ref 9–23)
BUN: 19 mg/dL (ref 6–24)
CHLORIDE: 99 mmol/L (ref 96–106)
CO2: 26 mmol/L (ref 20–29)
Calcium: 10.1 mg/dL (ref 8.7–10.2)
Creatinine, Ser: 1 mg/dL (ref 0.57–1.00)
GFR calc non Af Amer: 64 mL/min/{1.73_m2} (ref >59)
GFR, EST AFRICAN AMERICAN: 74 mL/min/{1.73_m2} (ref >59)
GLOBULIN, TOTAL: 2.4 g/dL (ref 1.5–4.5)
Glucose: 121 mg/dL — ABNORMAL HIGH (ref 65–99)
POTASSIUM: 4.3 mmol/L (ref 3.5–5.2)
Sodium: 142 mmol/L (ref 134–144)
TOTAL PROTEIN: 7.3 g/dL (ref 6.0–8.5)

## 2018-05-09 ENCOUNTER — Telehealth: Payer: Self-pay | Admitting: Family Medicine

## 2018-05-09 NOTE — Telephone Encounter (Signed)
Please advise 

## 2018-05-09 NOTE — Telephone Encounter (Signed)
Copied from [place] [id number]. Topic: Quick Communication - See Telephone Encounter >> May 09, 2018  1:25 PM Burchel, Abbi R wrote: CRM for notification. See Telephone encounter for: 05/09/18.  Pt states she is nearing the end of a 30 day change in BP rx.  Pt would like to let Dr Brigitte Pulse know that her BP has been in the low 100's/80's and her BP was 98/79 this afternoon. She also states that she is having episodes of dizziness 2-4 hrs after taking medication. Pt also reports low energy. Please advise pt on next steps.    [phone]

## 2018-05-12 DIAGNOSIS — F4322 Adjustment disorder with anxiety: Secondary | ICD-10-CM | POA: Diagnosis not present

## 2018-05-13 NOTE — Telephone Encounter (Signed)
I called pt - she had stopped the BP medication and the following morning BP was 111/88 after exercise and a cup of coffee. She planned to not take it for a couple of days but was concerned that her DBP was often more elevated and wondered if there was a BP med that might target that best. Hoping that now that her son has started Kindergarten her stress will decrease. Wondering if she should change to taking hctz only or take her BP med qod.    I rec pt try hctz only - as far as I am aware thiazide diuretics work as well as anything else for DBP - no other class better indicated for that. Other options if hctz alone was not controlling med was to cut current pill in half.  Will be happy to send in refill of hctz alone if needed.

## 2018-06-16 DIAGNOSIS — F4322 Adjustment disorder with anxiety: Secondary | ICD-10-CM | POA: Diagnosis not present

## 2018-06-27 MED FILL — HYDROCHLOROTHIAZIDE 12.5 MG: 12.5 | 90 days supply | Qty: 90 | Fill #1

## 2018-07-21 DIAGNOSIS — F4322 Adjustment disorder with anxiety: Secondary | ICD-10-CM | POA: Diagnosis not present

## 2018-09-17 IMAGING — MR MR HEAD WO/W CM
11 of 20 series · 30 of 48 positions shown · IV contrast (multihance)
Comparison: None.

CLINICAL DATA: Left inferior orbital muscle spasm for 7 weeks.
Left-sided facial numbness. Motor neuron disease. Numbness or
tingling, paresthesia.

EXAM:
MRI HEAD WITHOUT AND WITH CONTRAST
MRI CERVICAL SPINE WITHOUT AND WITH CONTRAST
TECHNIQUE: Multiplanar, multiecho pulse sequences of the brain and surrounding
structures, and cervical spine, to include the craniocervical
junction and cervicothoracic junction, were obtained without and
with intravenous contrast.
CONTRAST:  19mL MULTIHANCE GADOBENATE DIMEGLUMINE 529 MG/ML IV SOLN

[Series 3: DWI · axial · 3.0mm · 1.09mm/px · z∈[-19,+137]mm · 6 of 106 slices shown (1 of 4)]
[im 1/106]
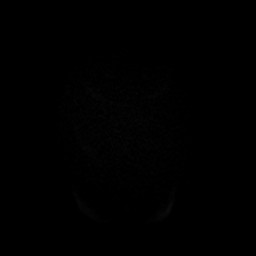
[im 22/106]
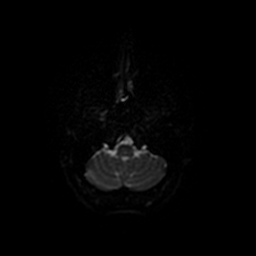
[im 43/106]
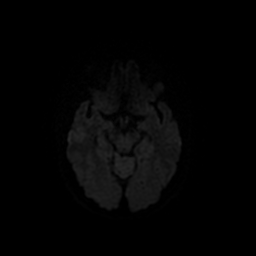
[im 64/106]
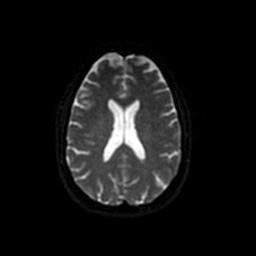
[im 85/106]
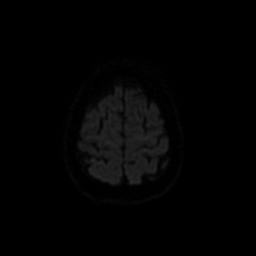
[im 106/106]
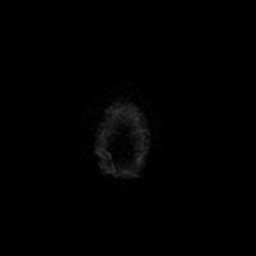

[Series 5: DWI · coronal · 5.0mm · 1.09mm/px · 5 of 72 slices shown (2 of 4)]
[im 1/72]
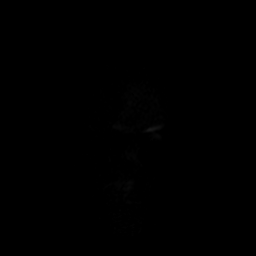
[im 18/72]
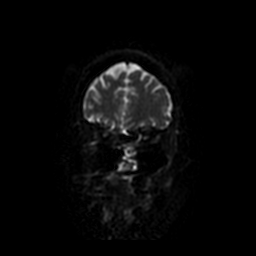
[im 36/72]
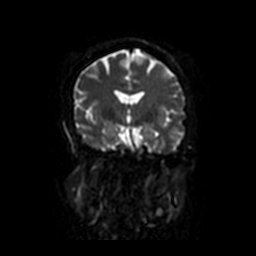
[im 54/72]
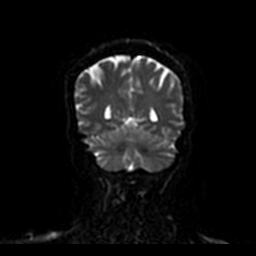
[im 72/72]
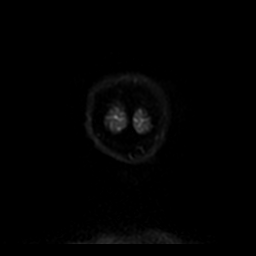

[Series 6: T2 · axial · 5.0mm · 0.43mm/px · z∈[-29,+139]mm · 2 of 25 slices shown (1 of 3)]
[im 1/25]
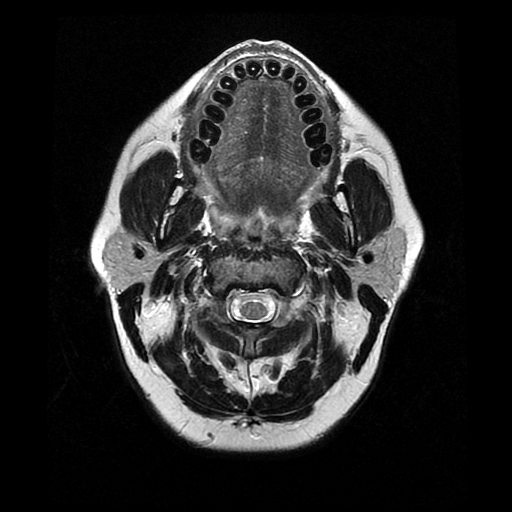
[im 25/25]
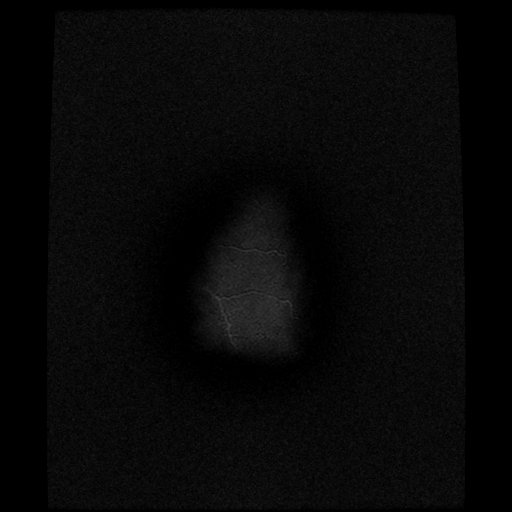

[Series 7: FLAIR · axial · 3.0mm · 0.43mm/px · z∈[-18,+132]mm · 2 of 26 slices shown]
[im 1/26]
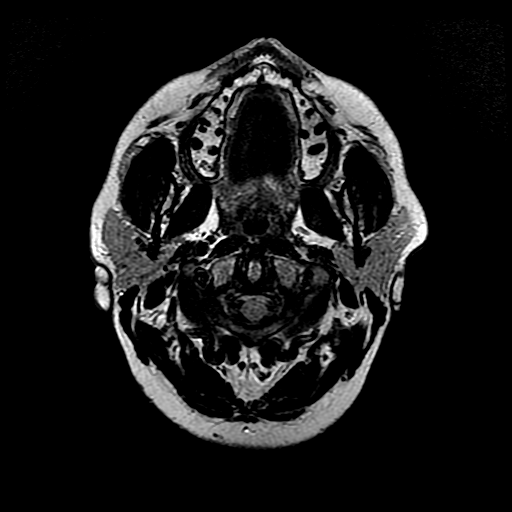
[im 26/26]
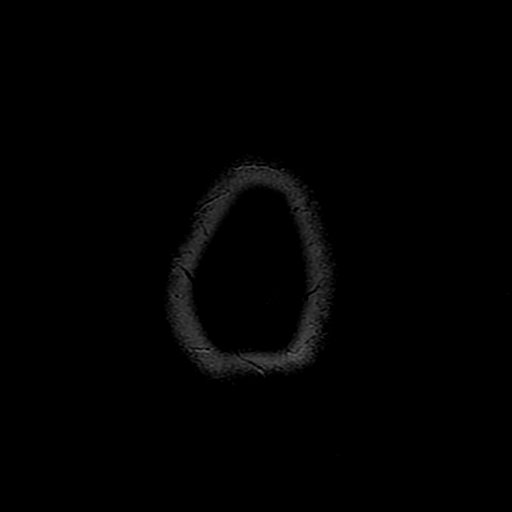

[Series 10: T2 · coronal · 5.0mm · 0.45mm/px · 2 of 27 slices shown (2 of 3)]
[im 1/27]
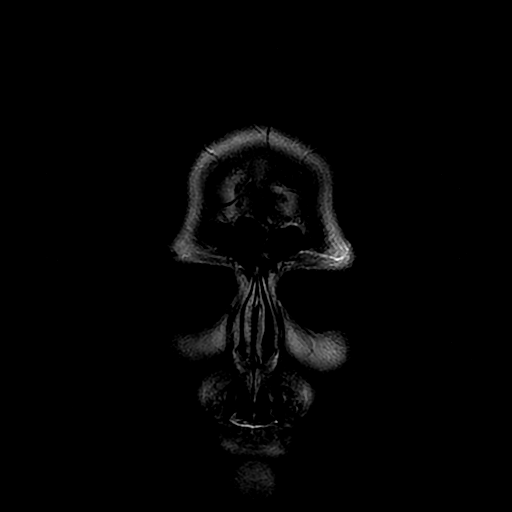
[im 27/27]
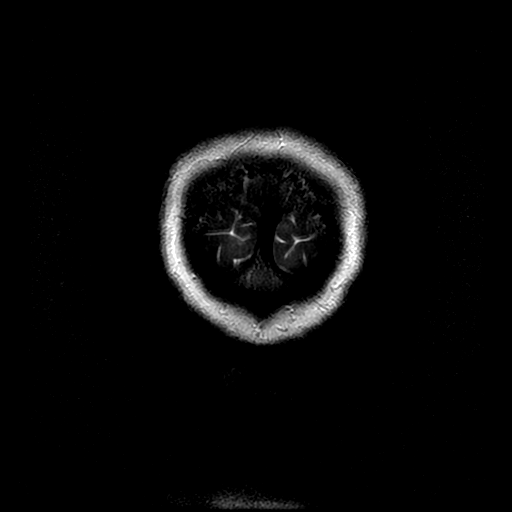

[Series 15: T2 · axial · 3.1mm · 0.35mm/px · z∈[-162,-66]mm · 2 of 29 slices shown (3 of 3)]
[im 1/29]
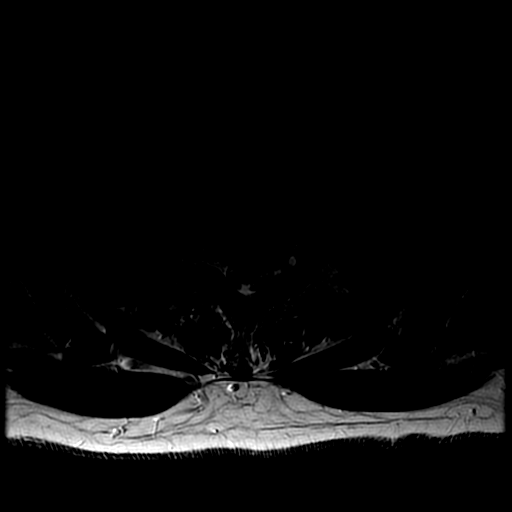
[im 29/29]
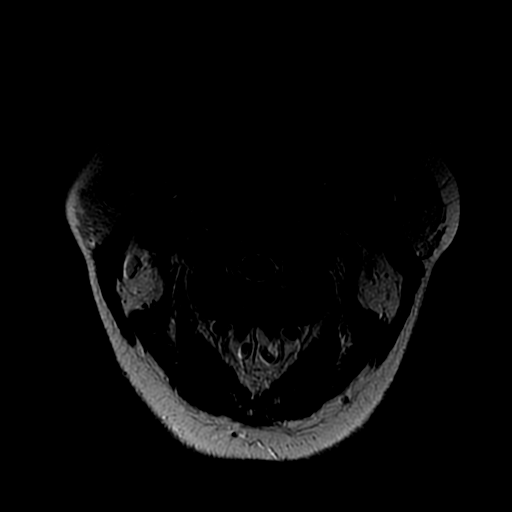

[Series 17: T2 post-contrast · sagittal · 3.0mm · 0.41mm/px · 1 of 12 slices shown]
[im 1/12]
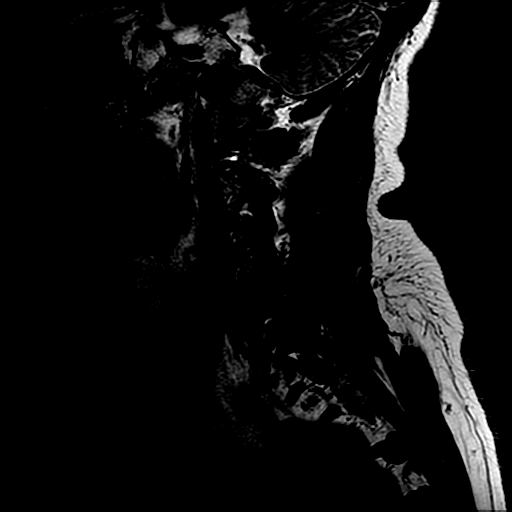

[Series 19: T1 post-contrast · axial · 3.1mm · 0.35mm/px · z∈[-162,-66]mm · 2 of 29 slices shown (1 of 2)]
[im 1/29]
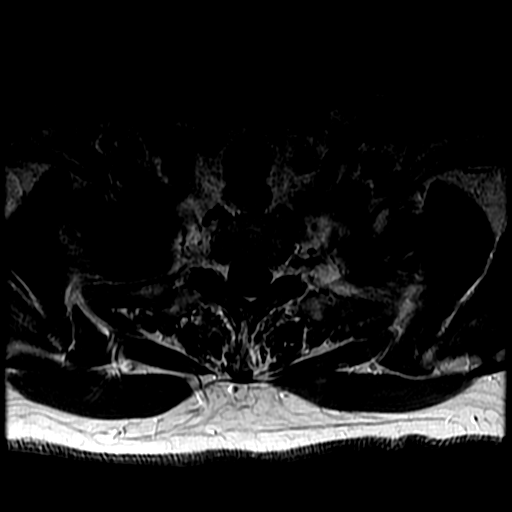
[im 29/29]
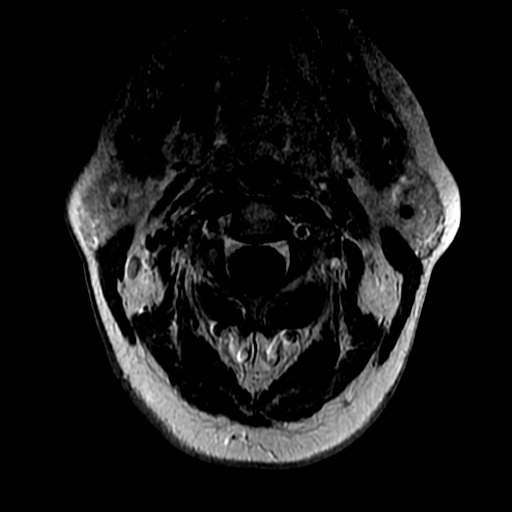

[Series 21: T1 post-contrast · coronal · 5.0mm · 0.45mm/px · 2 of 27 slices shown (2 of 2)]
[im 1/27]
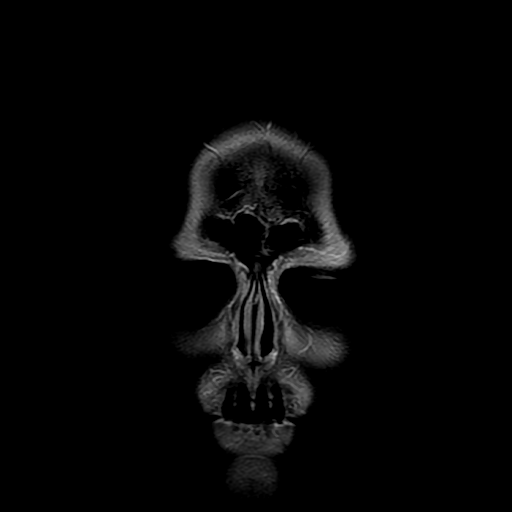
[im 27/27]
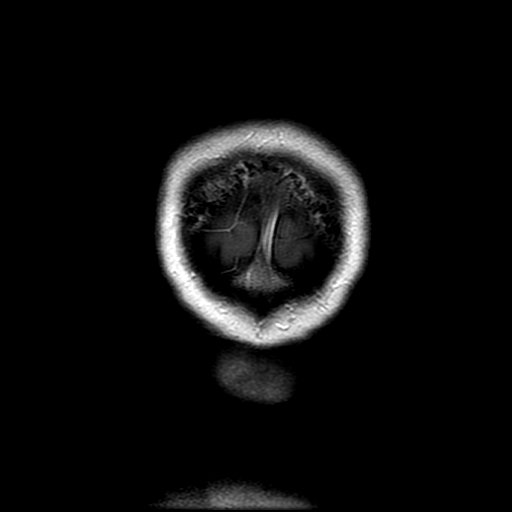

[Series 300: DWI · axial · 3.0mm · 1.09mm/px · z∈[-19,+137]mm · 4 of 53 slices shown (3 of 4)]
[im 1/53]
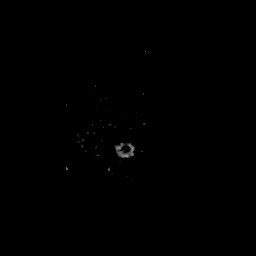
[im 18/53]
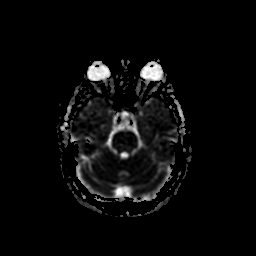
[im 35/53]
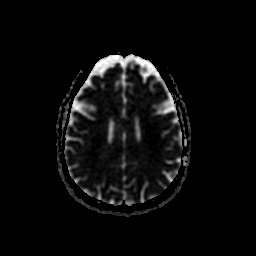
[im 53/53]
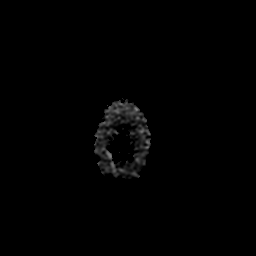

[Series 500: DWI · coronal · 5.0mm · 1.09mm/px · 2 of 36 slices shown (4 of 4)]
[im 1/36]
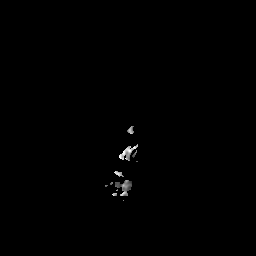
[im 36/36]
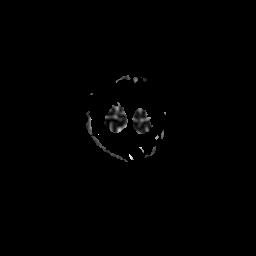

[30 of 48 positions shown; findings below may reference images not displayed]

FINDINGS: MRI HEAD FINDINGS

Brain: No infarction, hemorrhage, hydrocephalus, extra-axial
collection or mass lesion. No white matter disease or atrophy. No
specific finding in the brainstem, cisterns, or along the course of
the trigeminal and facial nerves. On postcontrast axial imaging
there is equivocal vascular loop in the left IAC, nonspecific.

Vascular: Major flow voids are preserved.

Skull and upper cervical spine: Negative for marrow lesion

Sinuses/Orbits: Negative

MRI CERVICAL SPINE FINDINGS

Alignment: Physiologic.

Vertebrae: No fracture, evidence of discitis, or bone lesion.

Cord: Normal signal and morphology.

Posterior Fossa, vertebral arteries, paraspinal tissues: Negative.

Disc levels:

C2-3: Unremarkable.

C3-4: Unremarkable.

C4-5: Small central disc protrusion without impingement.

C5-6: Central and right eccentric disc protrusion contacting the
ventral cord with mild ventral cord flattening towards the right.
Patent foramina.

C6-7: Unremarkable.

C7-T1:Unremarkable.
IMPRESSION: Brain MRI:

Normal appearance of the brain. No specific explanation for
symptoms.

Cervical MRI:

1. Small disc protrusions at C4-5 and C5-6. The C5-6 herniation
contacts the ventral cord with minimal ventral flattening.
2. Diffusely patent foramina.

## 2018-11-07 ENCOUNTER — Telehealth: Payer: Self-pay | Admitting: Family Medicine

## 2018-11-07 MED ORDER — AMLODIPINE BESYLATE 2.5 MG PO TABS: 2.5000 mg | ORAL_TABLET | Freq: Every day | ORAL | 3 refills | Status: DC

## 2018-11-07 MED ORDER — HYDROCHLOROTHIAZIDE 25 MG PO TABS: 25.0000 mg | ORAL_TABLET | Freq: Every day | ORAL | 3 refills | Status: DC

## 2018-11-07 MED FILL — HYDROCHLOROTHIAZIDE 25 MG T: 25 | 90 days supply | Qty: 90 | Fill #0

## 2018-11-07 MED FILL — AMLODIPINE 2.5 MG TABLET: 2.5 | 90 days supply | Qty: 90 | Fill #0

## 2018-11-07 NOTE — Telephone Encounter (Signed)
Talked to pt. Her diastolic BP is running [id number] off of meds. Is being super paranoid about her salt intake  - keeping to <3g daily - can never eat out.  Doing fabulous keeping the weight off. She would be open to restarting amlodipine.  Only BP med that has never made her feel lightheaded/dizzy. Ran out of hctz - would need to restart that with the amlodipine to help with any pedal edema it causes - can use prn edema - or take when she goes out to eat or has a higher salt in take than normal - along with plenty of water.

## 2018-12-29 DIAGNOSIS — F4322 Adjustment disorder with anxiety: Secondary | ICD-10-CM | POA: Diagnosis not present

## 2019-01-04 ENCOUNTER — Other Ambulatory Visit: Payer: Self-pay

## 2019-01-04 ENCOUNTER — Emergency Department (HOSPITAL_COMMUNITY): Payer: 59

## 2019-01-04 ENCOUNTER — Encounter (HOSPITAL_COMMUNITY): Payer: Self-pay | Admitting: Emergency Medicine

## 2019-01-04 ENCOUNTER — Emergency Department (HOSPITAL_COMMUNITY)
Admission: EM | Admit: 2019-01-04 | Discharge: 2019-01-04 | Disposition: A | Discharge status: Home / Self Care | Payer: 59 | Attending: Emergency Medicine | Admitting: Emergency Medicine

## 2019-01-04 DIAGNOSIS — R51 Headache: Secondary | ICD-10-CM | POA: Diagnosis not present

## 2019-01-04 DIAGNOSIS — M542 Cervicalgia: Secondary | ICD-10-CM | POA: Insufficient documentation

## 2019-01-04 DIAGNOSIS — Z8709 Personal history of other diseases of the respiratory system: Secondary | ICD-10-CM | POA: Diagnosis not present

## 2019-01-04 DIAGNOSIS — R112 Nausea with vomiting, unspecified: Secondary | ICD-10-CM | POA: Insufficient documentation

## 2019-01-04 DIAGNOSIS — I10 Essential (primary) hypertension: Secondary | ICD-10-CM | POA: Insufficient documentation

## 2019-01-04 DIAGNOSIS — G44309 Post-traumatic headache, unspecified, not intractable: Secondary | ICD-10-CM | POA: Diagnosis not present

## 2019-01-04 DIAGNOSIS — F0781 Postconcussional syndrome: Secondary | ICD-10-CM | POA: Insufficient documentation

## 2019-01-04 DIAGNOSIS — S199XXA Unspecified injury of neck, initial encounter: Secondary | ICD-10-CM | POA: Diagnosis not present

## 2019-01-04 DIAGNOSIS — F4322 Adjustment disorder with anxiety: Secondary | ICD-10-CM | POA: Diagnosis not present

## 2019-01-04 DIAGNOSIS — S0990XA Unspecified injury of head, initial encounter: Secondary | ICD-10-CM | POA: Diagnosis not present

## 2019-01-04 MED ORDER — ONDANSETRON HCL 4 MG PO TABS: 4.0000 mg | ORAL_TABLET | Freq: Four times a day (QID) | ORAL | 0 refills | Status: DC

## 2019-01-04 MED ORDER — ONDANSETRON 4 MG PO TBDP
4.0000 mg | ORAL_TABLET | Freq: Once | ORAL | Status: AC
  Administered 2019-01-04: 09:00:00 4 mg via ORAL
  Filled 2019-01-04: qty 1

## 2019-01-04 MED ORDER — KETOROLAC TROMETHAMINE 30 MG/ML IJ SOLN
30.0000 mg | Freq: Once | INTRAMUSCULAR | Status: AC
  Administered 2019-01-04: 10:00:00 30 mg via INTRAMUSCULAR
  Filled 2019-01-04: qty 1

## 2019-01-04 NOTE — ED Notes (Signed)
Patient verbalizes understanding of discharge instructions. Opportunity for questioning and answers were provided. Armband removed by staff, pt discharged from ED. Ambulated out to lobby  
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
## 2019-01-04 NOTE — ED Notes (Signed)
Patient transported to CT 
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
## 2019-01-04 NOTE — Discharge Instructions (Signed)
Please read attached information. If you experience any new or worsening signs or symptoms please return to the emergency room for evaluation. Please follow-up with your primary care provider or specialist as discussed. Please use medication prescribed only as directed and discontinue taking if you have any concerning signs or symptoms.   °

## 2019-01-17 DIAGNOSIS — F4322 Adjustment disorder with anxiety: Secondary | ICD-10-CM | POA: Diagnosis not present

## 2019-02-01 MED FILL — AMLODIPINE 2.5 MG TABLET: 2.5 | 90 days supply | Qty: 90 | Fill #1

## 2019-02-01 MED FILL — HYDROCHLOROTHIAZIDE 25 MG T: 25 | 90 days supply | Qty: 90 | Fill #1

## 2019-03-23 ENCOUNTER — Other Ambulatory Visit: Payer: Self-pay

## 2019-03-23 DIAGNOSIS — Z20822 Contact with and (suspected) exposure to covid-19: Secondary | ICD-10-CM

## 2019-03-23 NOTE — Addendum Note (Signed)
Addended by: Jonelle Sidle E on: 03/23/2019 11:22 AM   Modules accepted: Orders

## 2019-03-31 ENCOUNTER — Other Ambulatory Visit: Payer: Self-pay

## 2019-03-31 ENCOUNTER — Encounter (HOSPITAL_COMMUNITY): Payer: Self-pay | Admitting: Emergency Medicine

## 2019-03-31 ENCOUNTER — Emergency Department (HOSPITAL_COMMUNITY)
Admission: EM | Admit: 2019-03-31 | Discharge: 2019-03-31 | Disposition: A | Discharge status: Home / Self Care | Payer: 59 | Attending: Emergency Medicine | Admitting: Emergency Medicine

## 2019-03-31 DIAGNOSIS — J45909 Unspecified asthma, uncomplicated: Secondary | ICD-10-CM | POA: Insufficient documentation

## 2019-03-31 DIAGNOSIS — S0181XA Laceration without foreign body of other part of head, initial encounter: Secondary | ICD-10-CM | POA: Diagnosis not present

## 2019-03-31 DIAGNOSIS — S0993XA Unspecified injury of face, initial encounter: Secondary | ICD-10-CM | POA: Diagnosis not present

## 2019-03-31 DIAGNOSIS — Y9289 Other specified places as the place of occurrence of the external cause: Secondary | ICD-10-CM | POA: Diagnosis not present

## 2019-03-31 DIAGNOSIS — Z79899 Other long term (current) drug therapy: Secondary | ICD-10-CM | POA: Insufficient documentation

## 2019-03-31 DIAGNOSIS — I10 Essential (primary) hypertension: Secondary | ICD-10-CM | POA: Diagnosis not present

## 2019-03-31 DIAGNOSIS — R7303 Prediabetes: Secondary | ICD-10-CM | POA: Diagnosis not present

## 2019-03-31 DIAGNOSIS — W208XXA Other cause of strike by thrown, projected or falling object, initial encounter: Secondary | ICD-10-CM | POA: Diagnosis not present

## 2019-03-31 DIAGNOSIS — Y999 Unspecified external cause status: Secondary | ICD-10-CM | POA: Diagnosis not present

## 2019-03-31 DIAGNOSIS — Y9389 Activity, other specified: Secondary | ICD-10-CM | POA: Diagnosis not present
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
## 2019-03-31 NOTE — ED Triage Notes (Signed)
Pt states she took 800mg  ibuprofen immediately after injury.

## 2019-03-31 NOTE — ED Notes (Signed)
Pt left prior to official discharge.
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
## 2019-04-21 MED FILL — AMLODIPINE 2.5 MG TABLET: 2.5 | 90 days supply | Qty: 90 | Fill #2

## 2019-04-24 MED FILL — AMLODIPINE BESYLATE 5 MG TA: 5 | 90 days supply | Qty: 90 | Fill #0

## 2019-04-24 MED FILL — HYDROCHLOROTHIAZIDE 25 MG T: 25 | 90 days supply | Qty: 90 | Fill #0

## 2019-05-25 DIAGNOSIS — F4322 Adjustment disorder with anxiety: Secondary | ICD-10-CM | POA: Diagnosis not present

## 2019-05-25 MED FILL — HYDROCHLOROTHIAZIDE 25 MG T: 25 | 90 days supply | Qty: 90 | Fill #2

## 2019-05-31 MED FILL — AMLODIPINE BESYLATE 5 MG TA: 5 | 90 days supply | Qty: 90 | Fill #0

## 2019-06-05 DIAGNOSIS — F4322 Adjustment disorder with anxiety: Secondary | ICD-10-CM | POA: Diagnosis not present

## 2019-06-19 DIAGNOSIS — F4322 Adjustment disorder with anxiety: Secondary | ICD-10-CM | POA: Diagnosis not present

## 2019-08-30 MED FILL — AMLODIPINE BESYLATE 5 MG TA: 5 | 90 days supply | Qty: 90 | Fill #1

## 2019-08-30 MED FILL — HYDROCHLOROTHIAZIDE 25 MG T: 25 | 90 days supply | Qty: 90 | Fill #3

## 2019-09-28 MED FILL — NITROFURANTOIN MONO-MCR 100: 100 | 5 days supply | Qty: 10 | Fill #0

## 2019-10-16 DIAGNOSIS — M25512 Pain in left shoulder: Secondary | ICD-10-CM | POA: Diagnosis not present

## 2019-10-16 DIAGNOSIS — M7502 Adhesive capsulitis of left shoulder: Secondary | ICD-10-CM | POA: Diagnosis not present

## 2019-10-23 ENCOUNTER — Ambulatory Visit: Payer: 59 | Admitting: Physical Therapy

## 2019-11-15 ENCOUNTER — Other Ambulatory Visit: Payer: Self-pay

## 2019-11-15 ENCOUNTER — Ambulatory Visit: Payer: 59 | Attending: Orthopedic Surgery

## 2019-11-15 DIAGNOSIS — M25512 Pain in left shoulder: Secondary | ICD-10-CM | POA: Insufficient documentation

## 2019-11-15 DIAGNOSIS — G8929 Other chronic pain: Secondary | ICD-10-CM | POA: Diagnosis not present

## 2019-11-15 DIAGNOSIS — M25612 Stiffness of left shoulder, not elsewhere classified: Secondary | ICD-10-CM | POA: Diagnosis not present

## 2019-11-15 NOTE — Patient Instructions (Signed)
Access Code: [email or web address]: [email or web address]: 03/17/2021Prepared by: [email or web address]  Doorway Pec Stretch at 60 Elevation - 2 x daily - 7 x weekly - 1 sets - 3 reps - 20-30 sec hold  Standing Shoulder External Rotation Stretch in Doorway - 2 x daily - 7 x weekly - 1 sets - 3 reps - 20-30 sec hold  Standing Shoulder Flexion Stretch on Wall - 2 x daily - 7 x weekly - 1 sets - 3 reps - 20-30 sec hold  Seated Shoulder Rolls - 2 x daily - 7 x weekly - 1 sets - 10-20 reps

## 2019-11-15 NOTE — Therapy (Signed)
[place], [place], [address] Phone: [phone]   Fax:  [phone]  Physical Therapy Evaluation  Patient Details  Name: Sheila Nelson MRN: [id number] Date of Birth: 1961/03/13 Referring Provider (PT): Jenetta Loges   Encounter Date: 11/15/2019  PT End of Session - 11/15/19 0924    Visit Number  1    Number of Visits  13    Date for PT Re-Evaluation  12/27/19    Authorization Type  [facility]    PT Start Time  0833    PT Stop Time  0915    PT Time Calculation (min)  42 min    Activity Tolerance  Patient tolerated treatment well    Behavior During Therapy  [facility] for tasks assessed/performed       Past Medical History:  Diagnosis Date  . Allergy   . Arthritis    feet and ankles  . ASTHMA UNSPECIFIED WITH EXACERBATION   . Borderline diabetes   . Chest pain    a. 09/2015 felt to be non cardiac after LHC with no CAD   . Fasciculation 08/04/2017  . Gallstones   . GERD (gastroesophageal reflux disease)   . H/O varicella   . Herpes   . HTN (hypertension)   . Hx of viral meningitis   . Obesity   . Pancreatitis    Viral  . PLANTAR FASCIITIS, RIGHT 09/30/2010  . PVC's (premature ventricular contractions)   . REACTIVE AIRWAY DISEASE 10/09/2009  . TINNITUS 10/09/2009    Past Surgical History:  Procedure Laterality Date  . APPENDECTOMY  1992  . CARDIAC CATHETERIZATION N/A 09/17/2015   Procedure: Left Heart Cath and Coronary Angiography;  Surgeon: Burnell Blanks, MD;  Location: [facility];  Service: Cardiovascular;  Laterality: N/A;  . CESAREAN SECTION  06/23/2012   Procedure: CESAREAN SECTION;  Surgeon: Lovenia Kim, MD;  Location: [facility];  Service: Obstetrics;  Laterality: N/A;  . CHOLECYSTECTOMY N/A 10/05/2016   Procedure: LAPAROSCOPIC CHOLECYSTECTOMY;  Surgeon: Fanny Skates, MD;  Location: [facility];  Service: General;  Laterality: N/A;  . DILATION AND CURETTAGE OF UTERUS  2001  .  INGUINAL HERNIA REPAIR     Infant, bilateral  . MOUTH SURGERY    . Right thigh surgery  1973   Trauma    There were no vitals filed for this visit.   Subjective Assessment - 11/15/19 0835    Subjective  Patient presents with shoulder pain starting about 18 months ago, but fell hiking in Sept on L side while carrying 40# pack.  Had injection 4 weeks ago and sees MD again on 3/29.    Limitations  Other (comment)   cycling   How long can you sit comfortably?  pushing things straight out, reaching to get seat belt, and bra, doing yoga.  Pain if wakes up at night can't go back to sleep    Currently in Pain?  Yes    Pain Score  3     Pain Location  Shoulder    Pain Orientation  Left    Pain Descriptors / Indicators  Aching    Pain Type  Chronic pain    Pain Radiating Towards  down into armpit and to upper trap    Pain Onset  More than a month ago    Pain Frequency  Constant    Aggravating Factors   pushing things, reaching for seatbelt, weather, bra fastening, difficulty going back to sleep, reaching to put  up things above head.  Cycling, wearing hiking pack, fly fishing    Pain Relieving Factors  tylenol, ibuprofen         [facility] PT Assessment - 11/15/19 0001      Assessment   Medical Diagnosis  adhesive capsulitis    Referring Provider (PT)  Olivia Mackie Shuford    Onset Date/Surgical Date  03/31/18    Hand Dominance  Right    Next MD Visit  11/27/2019    Prior Therapy  HEP from MD and body work      Precautions   Precautions  None      Restrictions   Weight Bearing Restrictions  No      Balance Screen   Has the patient fallen in the past 6 months  Yes    How many times?  1    Has the patient had a decrease in activity level because of a fear of falling?   No    Is the patient reluctant to leave their home because of a fear of falling?   No      Home Film/video editor residence      Prior Function   Level of Independence  Independent      Cognition    Overall Cognitive Status  Within Functional Limits for tasks assessed      Observation/Other Assessments   Focus on Therapeutic Outcomes (FOTO)   47% limited      Sensation   Light Touch  Appears Intact    Hot/Cold  Appears Intact      Coordination   Gross Motor Movements are Fluid and Coordinated  Yes    Fine Motor Movements are Fluid and Coordinated  Yes      Posture/Postural Control   Posture/Postural Control  Postural limitations    Postural Limitations  Increased lumbar lordosis;Rounded Shoulders      ROM / Strength   AROM / PROM / Strength  AROM;PROM;Strength      AROM   Overall AROM   Deficits    AROM Assessment Site  Shoulder    Right/Left Shoulder  Right;Left    Right Shoulder Extension  90 Degrees    Right Shoulder Flexion  165 Degrees    Right Shoulder ABduction  155 Degrees    Right Shoulder Internal Rotation  73 Degrees    Right Shoulder External Rotation  90 Degrees    Left Shoulder Extension  70 Degrees    Left Shoulder Flexion  140 Degrees    Left Shoulder ABduction  135 Degrees    Left Shoulder Internal Rotation  76 Degrees    Left Shoulder External Rotation  70 Degrees      Strength   Overall Strength  Deficits    Strength Assessment Site  Shoulder    Right/Left Shoulder  Left;Right    Right Shoulder Flexion  5/5    Right Shoulder ABduction  4+/5    Left Shoulder Flexion  4+/5    Left Shoulder Extension  4/5      Transfers   Transfers  Sit to Stand    Sit to Stand  7: Independent      Ambulation/Gait   Ambulation/Gait  Yes    Ambulation/Gait Assistance  7: Independent    Ambulation Distance (Feet)  100 Feet   no issues               Objective measurements completed on examination: See above findings.  [facility] PT Treatment/Exercise - 11/15/19 0001      Shoulder Exercises: Seated   Other Seated Exercises  seated shoulder rolls x 10      Shoulder Exercises: Stretch   Corner Stretch  2 reps;20 seconds    External  Rotation Stretch  2 reps;20 seconds   elbow at side holding towel with door facing    Wall Stretch - Flexion  2 reps;20 seconds   at door facing            PT Education - 11/15/19 0923    Education Details  HEP, POC, pathophysiology    Person(s) Educated  Patient    Methods  Explanation;Demonstration    Comprehension  Need further instruction;Verbalized understanding          PT Long Term Goals - 11/15/19 0931      PT LONG TERM GOAL #1   Title  Patient will be independent with HEP for flexibility and strength    Time  6    Period  Weeks    Status  New    Target Date  12/27/19      PT LONG TERM GOAL #2   Title  Patient to report pain no greater than 3/10 with daily activities.    Time  6    Period  Weeks    Status  New    Target Date  12/27/19      PT LONG TERM GOAL #3   Title  Patient to demonstrate AROM L shoulder at least 160 flexion, 150 abduction and 80 ER    Time  6    Period  Weeks    Status  New    Target Date  12/27/19      PT LONG TERM GOAL #4   Title  Patient to reports ability to perform leisure activities including cycling and fishing with 50% less pain.    Time  6    Period  Weeks    Status  New    Target Date  12/27/19      PT LONG TERM GOAL #5   Title  Patient to report no greater than 35% limitation on FOTO outcome survey.    Time  6    Period  Weeks    Status  New             Plan - 11/15/19 0925    Clinical Impression Statement  Patient presents with L shoulder pain, decreased AROM and strength affecting her daily activities and leisure activities of fastening bra, reaching for seatbelt, hiking, fishing and cycling.  She reports pain improved wtih steriod injection 4 weeks ago, but now increasing pain.  She hopes to improve pain, ROM and be able to return to activities painfree.    Personal Factors and Comorbidities  Time since onset of injury/illness/exacerbation    Examination-Activity Limitations  Sleep;Carry;Lift;Reach  Overhead    Examination-Participation Restrictions  Laundry;Yard Work;Cleaning    Stability/Clinical Decision Making  Stable/Uncomplicated    Clinical Decision Making  Low    Rehab Potential  Excellent    PT Frequency  1x / week    PT Duration  6 weeks    PT Treatment/Interventions  ADLs/Self Care Home Management;Cryotherapy;Electrical Stimulation;Iontophoresis 4mg /ml Dexamethasone;Moist Heat;Ultrasound;DME Instruction;Functional mobility training;Therapeutic activities;Therapeutic exercise;Neuromuscular re-education;Patient/family education;Manual techniques;Passive range of motion;Dry needling;Taping;Joint Manipulations    PT Next Visit Plan  review HEP, PROM/joint mobs/capsular stretch, cont body mech education/sleep positions/sitting posture    PT Home Exercise Plan  flexion stretch  up wall, ER stretch at door, doorway stretch, shoulder rolls    Recommended Other Services  none    Consulted and Agree with Plan of Care  Patient       Patient will benefit from skilled therapeutic intervention in order to improve the following deficits and impairments:  Decreased mobility, Decreased range of motion, Decreased strength, Hypomobility, Increased muscle spasms, Impaired flexibility, Impaired UE functional use, Pain  Visit Diagnosis: Chronic left shoulder pain - Plan: PT plan of care cert/re-cert  Stiffness of left shoulder, not elsewhere classified - Plan: PT plan of care cert/re-cert     Problem List Patient Active Problem List   Diagnosis Date Noted  . Essential hypertension   . GERD (gastroesophageal reflux disease)   . Obesity   . PVC's (premature ventricular contractions)   . HTN (hypertension)   . PLANTAR FASCIITIS, RIGHT 09/30/2010  . ALLERGIC RHINITIS, SEASONAL 10/09/2009    Reginia Naas, PT 11/15/2019, 9:48 AM  [facility] [address] [place], [place], [address] Phone: [phone]   Fax:  [phone]  Name:  Marielouise Pajares MRN: [id number] Date of Birth: 1960/12/08

## 2019-11-27 ENCOUNTER — Other Ambulatory Visit (HOSPITAL_COMMUNITY): Payer: Self-pay | Admitting: Family Medicine

## 2019-11-27 DIAGNOSIS — M7502 Adhesive capsulitis of left shoulder: Secondary | ICD-10-CM | POA: Diagnosis not present

## 2019-11-27 DIAGNOSIS — M25512 Pain in left shoulder: Secondary | ICD-10-CM | POA: Diagnosis not present

## 2019-11-27 MED FILL — HYDROCHLOROTHIAZIDE 25 MG T: 25 | 90 days supply | Qty: 90 | Fill #0

## 2019-11-27 MED FILL — AMLODIPINE BESYLATE 5 MG TA: 5 | 90 days supply | Qty: 90 | Fill #0

## 2019-11-27 MED FILL — ESTRADIOL 0.1 MG/GM CREA: 0.1 | 42 days supply | Qty: 43 | Fill #0

## 2019-11-29 ENCOUNTER — Ambulatory Visit: Payer: 59 | Admitting: Physical Therapy

## 2019-11-29 ENCOUNTER — Encounter: Payer: Self-pay | Admitting: Physical Therapy

## 2019-11-29 ENCOUNTER — Other Ambulatory Visit: Payer: Self-pay

## 2019-11-29 DIAGNOSIS — M25512 Pain in left shoulder: Secondary | ICD-10-CM

## 2019-11-29 DIAGNOSIS — G8929 Other chronic pain: Secondary | ICD-10-CM

## 2019-11-29 DIAGNOSIS — M25612 Stiffness of left shoulder, not elsewhere classified: Secondary | ICD-10-CM | POA: Diagnosis not present

## 2019-11-29 NOTE — Therapy (Signed)
[place], [place], [address] Phone: [phone]   Fax:  [phone]  Physical Therapy Treatment  Patient Details  Name: Sheila Nelson MRN: [id number] Date of Birth: [birth year] Referring Provider (PT): Jenetta Loges   Encounter Date: 11/29/2019  PT End of Session - 11/29/19 0927    Visit Number  2    Number of Visits  13    Date for PT Re-Evaluation  12/27/19    Authorization Type  [facility]    PT Start Time  0851    PT Stop Time  0931    PT Time Calculation (min)  40 min    Activity Tolerance  Patient tolerated treatment well    Behavior During Therapy  [facility] for tasks assessed/performed       Past Medical History:  Diagnosis Date  . Allergy   . Arthritis    feet and ankles  . ASTHMA UNSPECIFIED WITH EXACERBATION   . Borderline diabetes   . Chest pain    a. 09/2015 felt to be non cardiac after LHC with no CAD   . Fasciculation 08/04/2017  . Gallstones   . GERD (gastroesophageal reflux disease)   . H/O varicella   . Herpes   . HTN (hypertension)   . Hx of viral meningitis   . Obesity   . Pancreatitis    Viral  . PLANTAR FASCIITIS, RIGHT 09/30/2010  . PVC's (premature ventricular contractions)   . REACTIVE AIRWAY DISEASE 10/09/2009  . TINNITUS 10/09/2009    Past Surgical History:  Procedure Laterality Date  . APPENDECTOMY  1992  . CARDIAC CATHETERIZATION N/A 09/17/2015   Procedure: Left Heart Cath and Coronary Angiography;  Surgeon: Burnell Blanks, MD;  Location: [facility];  Service: Cardiovascular;  Laterality: N/A;  . CESAREAN SECTION  06/23/2012   Procedure: CESAREAN SECTION;  Surgeon: Lovenia Kim, MD;  Location: [facility];  Service: Obstetrics;  Laterality: N/A;  . CHOLECYSTECTOMY N/A 10/05/2016   Procedure: LAPAROSCOPIC CHOLECYSTECTOMY;  Surgeon: Fanny Skates, MD;  Location: [facility];  Service: General;  Laterality: N/A;  . DILATION AND CURETTAGE OF UTERUS  2001  .  INGUINAL HERNIA REPAIR     Infant, bilateral  . MOUTH SURGERY    . Right thigh surgery  1973   Trauma    There were no vitals filed for this visit.  Subjective Assessment - 11/29/19 0909    Subjective  Good days and bad days but not too bad today. Stiffness noted most with reaching behind back with left UE.    Currently in Pain?  Yes   1-2/10   Pain Location  Shoulder    Pain Orientation  Left    Pain Descriptors / Indicators  Aching    Pain Type  Chronic pain    Pain Onset  More than a month ago    Pain Frequency  Constant    Aggravating Factors   activity, reaching ADLs    Pain Relieving Factors  medication         [facility] PT Assessment - 11/29/19 0001      PROM   Overall PROM Comments  left shoulder ER 90 deg after subscapularis release                   [facility] Adult PT Treatment/Exercise - 11/29/19 0001      Shoulder Exercises: Stretch   Other Shoulder Stretches  Brief instruction/review cross body horiz. abduction stretch and sleeper stretch  Manual Therapy   Manual Therapy  Joint mobilization;Soft tissue mobilization;Passive ROM    Joint Mobilization  Grade I-III AP and caudal GH mobilization    Soft tissue mobilization  subscapularis relase and posterior scapular STM    Passive ROM  Left shoulder 4-way       Trigger Point Dry Needling - 11/29/19 0001    Consent Given?  Yes    Education Handout Provided  --   verbal education-has had procedure before   Muscles Treated Upper Quadrant  Infraspinatus;Subscapularis;Deltoid;Teres minor    Dry Needling Comments  no estim for subscapularis which was needled in supine with 32 gauge 50 mm needles, other muscles needled in sidelying with 32 gauge 32 mm needles    Electrical Stimulation Performed with Dry Needling  Yes    E-stim with Dry Needling Details  TENS 20 pps x 10 minutes           PT Education - 11/29/19 0927    Education Details  HEP/stretches, dry needling    Person(s) Educated  Patient     Methods  Explanation;Demonstration;Verbal cues    Comprehension  Returned demonstration;Verbalized understanding          PT Long Term Goals - 11/15/19 0931      PT LONG TERM GOAL #1   Title  Patient will be independent with HEP for flexibility and strength    Time  6    Period  Weeks    Status  New    Target Date  12/27/19      PT LONG TERM GOAL #2   Title  Patient to report pain no greater than 3/10 with daily activities.    Time  6    Period  Weeks    Status  New    Target Date  12/27/19      PT LONG TERM GOAL #3   Title  Patient to demonstrate AROM L shoulder at least 160 flexion, 150 abduction and 80 ER    Time  6    Period  Weeks    Status  New    Target Date  12/27/19      PT LONG TERM GOAL #4   Title  Patient to reports ability to perform leisure activities including cycling and fishing with 50% less pain.    Time  6    Period  Weeks    Status  New    Target Date  12/27/19      PT LONG TERM GOAL #5   Title  Patient to report no greater than 35% limitation on FOTO outcome survey.    Time  6    Period  Weeks    Status  New            Plan - 11/29/19 [id number]    Clinical Impression Statement  Focus manual with PROM, STM and joint mobs.Also briefly reviewed sleeper and cross body horizontal adduction stretches to decrease posterior shoulder tightness and assist ROM reaching behind back. Trial dry needling as well to decrease myofascial contribution to tightness. Tx. well-tolerated and showing improved ROM for ER from baseline status.    Personal Factors and Comorbidities  Time since onset of injury/illness/exacerbation    Examination-Activity Limitations  Sleep;Carry;Lift;Reach Overhead    Examination-Participation Restrictions  Laundry;Yard Work;Cleaning    Stability/Clinical Decision Making  Stable/Uncomplicated    Clinical Decision Making  Low    Rehab Potential  Excellent    PT Frequency  1x / week  PT Duration  6 weeks    PT  Treatment/Interventions  ADLs/Self Care Home Management;Cryotherapy;Electrical Stimulation;Iontophoresis 4mg /ml Dexamethasone;Moist Heat;Ultrasound;DME Instruction;Functional mobility training;Therapeutic activities;Therapeutic exercise;Neuromuscular re-education;Patient/family education;Manual techniques;Passive range of motion;Dry needling;Taping;Joint Manipulations    PT Next Visit Plan  Check response dry needling, PROM/joint mobs/capsular stretch, cont body mech education/sleep positions/sitting posture    PT Home Exercise Plan  flexion stretch up wall, ER stretch at door, doorway stretch, shoulder rolls, sleeo vs. cross body horiz, adduction, towel stretch    Consulted and Agree with Plan of Care  Patient       Patient will benefit from skilled therapeutic intervention in order to improve the following deficits and impairments:  Decreased mobility, Decreased range of motion, Decreased strength, Hypomobility, Increased muscle spasms, Impaired flexibility, Impaired UE functional use, Pain  Visit Diagnosis: Chronic left shoulder pain  Stiffness of left shoulder, not elsewhere classified     Problem List Patient Active Problem List   Diagnosis Date Noted  . Essential hypertension   . GERD (gastroesophageal reflux disease)   . Obesity   . PVC's (premature ventricular contractions)   . HTN (hypertension)   . PLANTAR FASCIITIS, RIGHT 09/30/2010  . ALLERGIC RHINITIS, SEASONAL 10/09/2009    Beaulah Dinning, PT, DPT 11/29/19 9:48 AM  [facility] [address] [place], [place], [address] Phone: [phone]   Fax:  [phone]  Name: Sheila Nelson MRN: [id number] Date of Birth: 01-07-1961

## 2019-12-06 ENCOUNTER — Ambulatory Visit: Payer: 59 | Attending: Orthopedic Surgery | Admitting: Physical Therapy

## 2019-12-06 ENCOUNTER — Other Ambulatory Visit: Payer: Self-pay

## 2019-12-06 ENCOUNTER — Encounter: Payer: Self-pay | Admitting: Physical Therapy

## 2019-12-06 DIAGNOSIS — G8929 Other chronic pain: Secondary | ICD-10-CM | POA: Diagnosis not present

## 2019-12-06 DIAGNOSIS — M25612 Stiffness of left shoulder, not elsewhere classified: Secondary | ICD-10-CM | POA: Insufficient documentation

## 2019-12-06 DIAGNOSIS — M25512 Pain in left shoulder: Secondary | ICD-10-CM | POA: Diagnosis not present

## 2019-12-06 NOTE — Therapy (Signed)
[place], [place], [address] Phone: [phone]   Fax:  [phone]  Physical Therapy Treatment  Patient Details  Name: Sheila Nelson MRN: [id number] Date of Birth: 15-Oct-1960 Referring Provider (PT): Jenetta Loges   Encounter Date: 12/06/2019  PT End of Session - 12/06/19 0811    Visit Number  3    Number of Visits  13    Date for PT Re-Evaluation  12/27/19    Authorization Type  [facility]    PT Start Time  0805    PT Stop Time  0848    PT Time Calculation (min)  43 min    Activity Tolerance  Patient tolerated treatment well    Behavior During Therapy  [facility] for tasks assessed/performed       Past Medical History:  Diagnosis Date  . Allergy   . Arthritis    feet and ankles  . ASTHMA UNSPECIFIED WITH EXACERBATION   . Borderline diabetes   . Chest pain    a. 09/2015 felt to be non cardiac after LHC with no CAD   . Fasciculation 08/04/2017  . Gallstones   . GERD (gastroesophageal reflux disease)   . H/O varicella   . Herpes   . HTN (hypertension)   . Hx of viral meningitis   . Obesity   . Pancreatitis    Viral  . PLANTAR FASCIITIS, RIGHT 09/30/2010  . PVC's (premature ventricular contractions)   . REACTIVE AIRWAY DISEASE 10/09/2009  . TINNITUS 10/09/2009    Past Surgical History:  Procedure Laterality Date  . APPENDECTOMY  1992  . CARDIAC CATHETERIZATION N/A 09/17/2015   Procedure: Left Heart Cath and Coronary Angiography;  Surgeon: Burnell Blanks, MD;  Location: [place] [facility];  Service: Cardiovascular;  Laterality: N/A;  . CESAREAN SECTION  06/23/2012   Procedure: CESAREAN SECTION;  Surgeon: Lovenia Kim, MD;  Location: [place] [facility];  Service: Obstetrics;  Laterality: N/A;  . CHOLECYSTECTOMY N/A 10/05/2016   Procedure: LAPAROSCOPIC CHOLECYSTECTOMY;  Surgeon: Fanny Skates, MD;  Location: [facility];  Service: General;  Laterality: N/A;  . DILATION AND CURETTAGE OF UTERUS  2001  .  INGUINAL HERNIA REPAIR     Infant, bilateral  . MOUTH SURGERY    . Right thigh surgery  1973   Trauma    There were no vitals filed for this visit.  Subjective Assessment - 12/06/19 0806    Subjective  Pt. reports tx. last session was helpful for both motion and decreased pain. Soreness was better until around yesterday. Pain 3/10 this AM mostly in axillary region and tricep.    Currently in Pain?  Yes    Pain Score  3     Pain Location  Shoulder    Pain Orientation  Left    Pain Descriptors / Indicators  Aching    Pain Type  Chronic pain    Pain Onset  More than a month ago    Pain Frequency  Constant    Aggravating Factors   actvity, reaching ADLs    Pain Relieving Factors  medication    Effect of Pain on Daily Activities  impacts ability reaching ADLs         [facility] PT Assessment - 12/06/19 0001      AROM   Left Shoulder External Rotation  90 Degrees                   [facility] Adult PT Treatment/Exercise - 12/06/19 0001  Exercises   Exercises  Shoulder      Shoulder Exercises: Pulleys   Flexion  2 minutes    Scaption  2 minutes      Shoulder Exercises: Stretch   Other Shoulder Stretches  posterior capsulae stretch 20 sec x 3    Other Shoulder Stretches  sleeper stretch 20 sec x 3      Manual Therapy   Joint Mobilization  Grade I-III AP and caudal GH mobilization    Soft tissue mobilization  posterior scapular STM    Passive ROM  Left shoulder 4-way       Trigger Point Dry Needling - 12/06/19 0001    Consent Given?  Yes    Muscles Treated Head and Neck  Upper trapezius    Muscles Treated Upper Quadrant  Supraspinatus;Infraspinatus;Deltoid;Triceps    Dry Needling Comments  needling in right sidelying with 32 gauge 30 mm needles    Electrical Stimulation Performed with Dry Needling  Yes    E-stim with Dry Needling Details  TNES 20 pps x 10 minutes                PT Long Term Goals - 11/15/19 0931      PT LONG TERM GOAL #1   Title   Patient will be independent with HEP for flexibility and strength    Time  6    Period  Weeks    Status  New    Target Date  12/27/19      PT LONG TERM GOAL #2   Title  Patient to report pain no greater than 3/10 with daily activities.    Time  6    Period  Weeks    Status  New    Target Date  12/27/19      PT LONG TERM GOAL #3   Title  Patient to demonstrate AROM L shoulder at least 160 flexion, 150 abduction and 80 ER    Time  6    Period  Weeks    Status  New    Target Date  12/27/19      PT LONG TERM GOAL #4   Title  Patient to reports ability to perform leisure activities including cycling and fishing with 50% less pain.    Time  6    Period  Weeks    Status  New    Target Date  12/27/19      PT LONG TERM GOAL #5   Title  Patient to report no greater than 35% limitation on FOTO outcome survey.    Time  6    Period  Weeks    Status  New            Plan - 12/06/19 [id number]    Clinical Impression Statement  Still with tightness limiting IR ROM in particular but showing progress from baseline with ROM gains most notably for ER as well as improvement with improved tolerance reaching activities.    Personal Factors and Comorbidities  Time since onset of injury/illness/exacerbation    Examination-Activity Limitations  Sleep;Carry;Lift;Reach Overhead    Examination-Participation Restrictions  Laundry;Yard Work;Cleaning    Stability/Clinical Decision Making  Stable/Uncomplicated    Clinical Decision Making  Low    Rehab Potential  Excellent    PT Frequency  1x / week    PT Duration  6 weeks    PT Treatment/Interventions  ADLs/Self Care Home Management;Cryotherapy;Electrical Stimulation;Iontophoresis 4mg /ml Dexamethasone;Moist Heat;Ultrasound;DME Instruction;Functional mobility training;Therapeutic activities;Therapeutic exercise;Neuromuscular re-education;Patient/family education;Manual techniques;Passive range  of motion;Dry needling;Taping;Joint Manipulations    PT Next  Visit Plan  dry needling, PROM/joint mobs/capsular stretch, cont body mech education/sleep positions/sitting posture    PT Home Exercise Plan  flexion stretch up wall, ER stretch at door, doorway stretch, shoulder rolls, sleeo vs. cross body horiz, adduction, towel stretch    Consulted and Agree with Plan of Care  Patient       Patient will benefit from skilled therapeutic intervention in order to improve the following deficits and impairments:  Decreased mobility, Decreased range of motion, Decreased strength, Hypomobility, Increased muscle spasms, Impaired flexibility, Impaired UE functional use, Pain  Visit Diagnosis: Chronic left shoulder pain  Stiffness of left shoulder, not elsewhere classified     Problem List Patient Active Problem List   Diagnosis Date Noted  . Essential hypertension   . GERD (gastroesophageal reflux disease)   . Obesity   . PVC's (premature ventricular contractions)   . HTN (hypertension)   . PLANTAR FASCIITIS, RIGHT 09/30/2010  . ALLERGIC RHINITIS, SEASONAL 10/09/2009    Beaulah Dinning, PT, DPT 12/06/19 9:01 AM  [facility] [address] [place], [place], [address] Phone: [phone]   Fax:  [phone]  Name: Sheila Nelson MRN: [id number] Date of Birth: 1961-04-01

## 2019-12-06 NOTE — Therapy (Signed)
[facility], [place], [address] Phone: [phone]   Fax:  [phone]  Physical Therapy Treatment  Patient Details  Name: Sheila Nelson MRN: [id number] Date of Birth: 02/24/1961 Referring Provider (PT): Jenetta Loges   Encounter Date: 12/06/2019  PT End of Session - 12/06/19 0811    Visit Number  3    Number of Visits  13    Date for PT Re-Evaluation  12/27/19    Authorization Type  [facility]    PT Start Time  0805    PT Stop Time  0848    PT Time Calculation (min)  43 min    Activity Tolerance  Patient tolerated treatment well    Behavior During Therapy  [facility] for tasks assessed/performed       Past Medical History:  Diagnosis Date  . Allergy   . Arthritis    feet and ankles  . ASTHMA UNSPECIFIED WITH EXACERBATION   . Borderline diabetes   . Chest pain    a. 09/2015 felt to be non cardiac after LHC with no CAD   . Fasciculation 08/04/2017  . Gallstones   . GERD (gastroesophageal reflux disease)   . H/O varicella   . Herpes   . HTN (hypertension)   . Hx of viral meningitis   . Obesity   . Pancreatitis    Viral  . PLANTAR FASCIITIS, RIGHT 09/30/2010  . PVC's (premature ventricular contractions)   . REACTIVE AIRWAY DISEASE 10/09/2009  . TINNITUS 10/09/2009    Past Surgical History:  Procedure Laterality Date  . APPENDECTOMY  1992  . CARDIAC CATHETERIZATION N/A 09/17/2015   Procedure: Left Heart Cath and Coronary Angiography;  Surgeon: Burnell Blanks, MD;  Location: [place] [facility];  Service: Cardiovascular;  Laterality: N/A;  . CESAREAN SECTION  06/23/2012   Procedure: CESAREAN SECTION;  Surgeon: Lovenia Kim, MD;  Location: [place] [facility];  Service: Obstetrics;  Laterality: N/A;  . CHOLECYSTECTOMY N/A 10/05/2016   Procedure: LAPAROSCOPIC CHOLECYSTECTOMY;  Surgeon: Fanny Skates, MD;  Location: [facility];  Service: General;  Laterality: N/A;  . DILATION AND CURETTAGE OF UTERUS  2001  .  INGUINAL HERNIA REPAIR     Infant, bilateral  . MOUTH SURGERY    . Right thigh surgery  1973   Trauma    There were no vitals filed for this visit.  Subjective Assessment - 12/06/19 0806    Subjective  Pt. reports tx. last session was helpful for both motion and decreased pain. Soreness was better until around yesterday. Pain 3/10 this AM mostly in axillary region and tricep.    Currently in Pain?  Yes    Pain Score  3     Pain Location  Shoulder    Pain Orientation  Left    Pain Descriptors / Indicators  Aching    Pain Type  Chronic pain    Pain Onset  More than a month ago    Pain Frequency  Constant    Aggravating Factors   actvity, reaching ADLs    Pain Relieving Factors  medication    Effect of Pain on Daily Activities  impacts ability reaching ADLs         [facility] PT Assessment - 12/06/19 0001      AROM   Left Shoulder External Rotation  90 Degrees                   [facility] Adult PT Treatment/Exercise - 12/06/19 0001  Exercises   Exercises  Shoulder      Shoulder Exercises: Pulleys   Flexion  2 minutes    Scaption  2 minutes      Shoulder Exercises: Stretch   Other Shoulder Stretches  posterior capsulae stretch 20 sec x 3    Other Shoulder Stretches  sleeper stretch 20 sec x 3      Manual Therapy   Joint Mobilization  Grade I-III AP and caudal GH mobilization    Soft tissue mobilization  posterior scapular STM    Passive ROM  Left shoulder 4-way       Trigger Point Dry Needling - 12/06/19 0001    Consent Given?  Yes    Muscles Treated Head and Neck  Upper trapezius    Muscles Treated Upper Quadrant  Supraspinatus;Infraspinatus;Deltoid    Dry Needling Comments  needling in right sidelying with 32 gauge 30 mm needles    Electrical Stimulation Performed with Dry Needling  Yes    E-stim with Dry Needling Details  TNES 20 pps x 10 minutes                PT Long Term Goals - 11/15/19 0931      PT LONG TERM GOAL #1   Title  Patient  will be independent with HEP for flexibility and strength    Time  6    Period  Weeks    Status  New    Target Date  12/27/19      PT LONG TERM GOAL #2   Title  Patient to report pain no greater than 3/10 with daily activities.    Time  6    Period  Weeks    Status  New    Target Date  12/27/19      PT LONG TERM GOAL #3   Title  Patient to demonstrate AROM L shoulder at least 160 flexion, 150 abduction and 80 ER    Time  6    Period  Weeks    Status  New    Target Date  12/27/19      PT LONG TERM GOAL #4   Title  Patient to reports ability to perform leisure activities including cycling and fishing with 50% less pain.    Time  6    Period  Weeks    Status  New    Target Date  12/27/19      PT LONG TERM GOAL #5   Title  Patient to report no greater than 35% limitation on FOTO outcome survey.    Time  6    Period  Weeks    Status  New            Plan - 12/06/19 [id number]    Clinical Impression Statement  Still with tightness limiting IR ROM in particular but showing progress from baseline with ROM gains most notably for ER as well as improvement with improved tolerance reaching activities.    Personal Factors and Comorbidities  Time since onset of injury/illness/exacerbation    Examination-Activity Limitations  Sleep;Carry;Lift;Reach Overhead    Examination-Participation Restrictions  Laundry;Yard Work;Cleaning    Stability/Clinical Decision Making  Stable/Uncomplicated    Clinical Decision Making  Low    Rehab Potential  Excellent    PT Frequency  1x / week    PT Duration  6 weeks    PT Treatment/Interventions  ADLs/Self Care Home Management;Cryotherapy;Electrical Stimulation;Iontophoresis 4mg /ml Dexamethasone;Moist Heat;Ultrasound;DME Instruction;Functional mobility training;Therapeutic activities;Therapeutic exercise;Neuromuscular re-education;Patient/family education;Manual techniques;Passive range  of motion;Dry needling;Taping;Joint Manipulations    PT Next Visit Plan   dry needling, PROM/joint mobs/capsular stretch, cont body mech education/sleep positions/sitting posture    PT Home Exercise Plan  flexion stretch up wall, ER stretch at door, doorway stretch, shoulder rolls, sleeo vs. cross body horiz, adduction, towel stretch    Consulted and Agree with Plan of Care  Patient       Patient will benefit from skilled therapeutic intervention in order to improve the following deficits and impairments:  Decreased mobility, Decreased range of motion, Decreased strength, Hypomobility, Increased muscle spasms, Impaired flexibility, Impaired UE functional use, Pain  Visit Diagnosis: Chronic left shoulder pain  Stiffness of left shoulder, not elsewhere classified     Problem List Patient Active Problem List   Diagnosis Date Noted  . Essential hypertension   . GERD (gastroesophageal reflux disease)   . Obesity   . PVC's (premature ventricular contractions)   . HTN (hypertension)   . PLANTAR FASCIITIS, RIGHT 09/30/2010  . ALLERGIC RHINITIS, SEASONAL 10/09/2009    Beaulah Dinning, PT, DPT 12/06/19 8:44 AM  [facility] [address] [place], [place], [address] Phone: [phone]   Fax:  [phone]  Name: Sheila Nelson MRN: [id number] Date of Birth: 1961/06/29

## 2019-12-13 ENCOUNTER — Ambulatory Visit: Payer: 59 | Admitting: Physical Therapy

## 2019-12-18 DIAGNOSIS — Z1151 Encounter for screening for human papillomavirus (HPV): Secondary | ICD-10-CM | POA: Diagnosis not present

## 2019-12-18 DIAGNOSIS — I8311 Varicose veins of right lower extremity with inflammation: Secondary | ICD-10-CM | POA: Diagnosis not present

## 2019-12-18 DIAGNOSIS — J301 Allergic rhinitis due to pollen: Secondary | ICD-10-CM | POA: Diagnosis not present

## 2019-12-18 DIAGNOSIS — I10 Essential (primary) hypertension: Secondary | ICD-10-CM | POA: Diagnosis not present

## 2019-12-18 DIAGNOSIS — I493 Ventricular premature depolarization: Secondary | ICD-10-CM | POA: Diagnosis not present

## 2019-12-18 DIAGNOSIS — R5383 Other fatigue: Secondary | ICD-10-CM | POA: Diagnosis not present

## 2019-12-18 DIAGNOSIS — I8312 Varicose veins of left lower extremity with inflammation: Secondary | ICD-10-CM | POA: Diagnosis not present

## 2019-12-18 DIAGNOSIS — C44301 Unspecified malignant neoplasm of skin of nose: Secondary | ICD-10-CM | POA: Diagnosis not present

## 2019-12-18 DIAGNOSIS — R7303 Prediabetes: Secondary | ICD-10-CM | POA: Diagnosis not present

## 2019-12-18 DIAGNOSIS — Z1279 Encounter for screening for malignant neoplasm of other genitourinary organs: Secondary | ICD-10-CM | POA: Diagnosis not present

## 2019-12-18 DIAGNOSIS — Z113 Encounter for screening for infections with a predominantly sexual mode of transmission: Secondary | ICD-10-CM | POA: Diagnosis not present

## 2019-12-18 DIAGNOSIS — K219 Gastro-esophageal reflux disease without esophagitis: Secondary | ICD-10-CM | POA: Diagnosis not present

## 2019-12-20 ENCOUNTER — Ambulatory Visit: Payer: 59 | Admitting: Physical Therapy

## 2019-12-20 ENCOUNTER — Encounter: Payer: Self-pay | Admitting: Physical Therapy

## 2019-12-20 ENCOUNTER — Other Ambulatory Visit: Payer: Self-pay

## 2019-12-20 DIAGNOSIS — G8929 Other chronic pain: Secondary | ICD-10-CM

## 2019-12-20 DIAGNOSIS — M25512 Pain in left shoulder: Secondary | ICD-10-CM | POA: Diagnosis not present

## 2019-12-20 DIAGNOSIS — M25612 Stiffness of left shoulder, not elsewhere classified: Secondary | ICD-10-CM | POA: Diagnosis not present

## 2019-12-20 NOTE — Patient Instructions (Signed)
Access Code: [email or web address] URL: [email or web address] Date: 12/20/2019 Prepared by: Hessie Diener  Exercises Shoulder External Rotation with Anchored Resistance - 2 x daily - 7 x weekly - 2 sets - 10 reps Shoulder Internal Rotation with Resistance - 2 x daily - 7 x weekly - 2 sets - 10 reps Standing Bilateral Low Shoulder Row with Anchored Resistance - 2 x daily - 7 x weekly - 2 sets - 10 reps Shoulder Extension with Resistance - Palms Forward - 2 x daily - 7 x weekly - 2 sets - 10 reps

## 2019-12-20 NOTE — Therapy (Signed)
[place], [place], [address] Phone: [phone]   Fax:  [phone]  Physical Therapy Treatment  Patient Details  Name: Sheila Nelson MRN: [id number] Date of Birth: June 17, 1961 Referring Provider (PT): Jenetta Loges   Encounter Date: 12/20/2019  PT End of Session - 12/20/19 0810    Visit Number  4    Number of Visits  13    Date for PT Re-Evaluation  12/27/19    Authorization Type  [facility]    PT Start Time  0805    PT Stop Time  0843    PT Time Calculation (min)  38 min       Past Medical History:  Diagnosis Date  . Allergy   . Arthritis    feet and ankles  . ASTHMA UNSPECIFIED WITH EXACERBATION   . Borderline diabetes   . Chest pain    a. 09/2015 felt to be non cardiac after LHC with no CAD   . Fasciculation 08/04/2017  . Gallstones   . GERD (gastroesophageal reflux disease)   . H/O varicella   . Herpes   . HTN (hypertension)   . Hx of viral meningitis   . Obesity   . Pancreatitis    Viral  . PLANTAR FASCIITIS, RIGHT 09/30/2010  . PVC's (premature ventricular contractions)   . REACTIVE AIRWAY DISEASE 10/09/2009  . TINNITUS 10/09/2009    Past Surgical History:  Procedure Laterality Date  . APPENDECTOMY  1992  . CARDIAC CATHETERIZATION N/A 09/17/2015   Procedure: Left Heart Cath and Coronary Angiography;  Surgeon: Burnell Blanks, MD;  Location: [place] [facility];  Service: Cardiovascular;  Laterality: N/A;  . CESAREAN SECTION  06/23/2012   Procedure: CESAREAN SECTION;  Surgeon: Lovenia Kim, MD;  Location: [place] [facility];  Service: Obstetrics;  Laterality: N/A;  . CHOLECYSTECTOMY N/A 10/05/2016   Procedure: LAPAROSCOPIC CHOLECYSTECTOMY;  Surgeon: Fanny Skates, MD;  Location: [facility];  Service: General;  Laterality: N/A;  . DILATION AND CURETTAGE OF UTERUS  2001  . INGUINAL HERNIA REPAIR     Infant, bilateral  . MOUTH SURGERY    . Right thigh surgery  1973   Trauma    There were  no vitals filed for this visit.  Subjective Assessment - 12/20/19 0809    Subjective  No pain now.    Currently in Pain?  No/denies    Aggravating Factors   generally after a bike ride and at end of day    Pain Relieving Factors  rest, meds         [facility] PT Assessment - 12/20/19 0001      AROM   Left Shoulder Flexion  165 Degrees    Left Shoulder ABduction  153 Degrees                   [facility] Adult PT Treatment/Exercise - 12/20/19 0001      Shoulder Exercises: Standing   External Rotation  10 reps    Theraband Level (Shoulder External Rotation)  Level 2 (Red)    Internal Rotation  10 reps    Theraband Level (Shoulder Internal Rotation)  Level 2 (Red)    Extension  15 reps    Theraband Level (Shoulder Extension)  Level 2 (Red)    Row  15 reps    Theraband Level (Shoulder Row)  Level 2 (Red)      Shoulder Exercises: Pulleys   Flexion  2 minutes    Scaption  2  minutes      Shoulder Exercises: Stretch   Internal Rotation Stretch  30 seconds    Internal Rotation Stretch Limitations  30 sec , also self mob with towel roll in axilla x 3 x 30 sec , followed by towel stretch and UE ranger IR AAROM     Other Shoulder Stretches  posterior capsulae stretch 20 sec x 3    Other Shoulder Stretches  sleeper stretch 20 sec x 3      Manual Therapy   Joint Mobilization  Grade I-III AP and caudal [place] mobilization    Passive ROM  Left shoulder 4-way             PT Education - 12/20/19 0843    Education Details  HEP    Person(s) Educated  Patient    Methods  Explanation;Handout    Comprehension  Verbalized understanding          PT Long Term Goals - 11/15/19 0931      PT LONG TERM GOAL #1   Title  Patient will be independent with HEP for flexibility and strength    Time  6    Period  Weeks    Status  New    Target Date  12/27/19      PT LONG TERM GOAL #2   Title  Patient to report pain no greater than 3/10 with daily activities.    Time  6    Period   Weeks    Status  New    Target Date  12/27/19      PT LONG TERM GOAL #3   Title  Patient to demonstrate AROM L shoulder at least 160 flexion, 150 abduction and 80 ER    Time  6    Period  Weeks    Status  New    Target Date  12/27/19      PT LONG TERM GOAL #4   Title  Patient to reports ability to perform leisure activities including cycling and fishing with 50% less pain.    Time  6    Period  Weeks    Status  New    Target Date  12/27/19      PT LONG TERM GOAL #5   Title  Patient to report no greater than 35% limitation on FOTO outcome survey.    Time  6    Period  Weeks    Status  New            Plan - 12/20/19 [id number]    Clinical Impression Statement  Pt reports improvement in pain with pain mostly at end of day or after exercise like bike riding. TPDN has been helpful. Slight decrease in IR compared to right UE. Instructed pt in self mobs, strethcing and AAROM and she deomstrated equal IR AROM afterward. Began RTC strengthening and scap stab. Session tolerated well.    PT Treatment/Interventions  ADLs/Self Care Home Management;Cryotherapy;Electrical Stimulation;Iontophoresis 4mg /ml Dexamethasone;Moist Heat;Ultrasound;DME Instruction;Functional mobility training;Therapeutic activities;Therapeutic exercise;Neuromuscular re-education;Patient/family education;Manual techniques;Passive range of motion;Dry needling;Taping;Joint Manipulations    PT Next Visit Plan  FOTO; ERO; check strengthening HEP; dry needling, PROM/joint mobs/capsular stretch, cont body mech education/sleep positions/sitting posture    PT Home Exercise Plan  flexion stretch up wall, ER stretch at door, doorway stretch, shoulder rolls, sleeo vs. cross body horiz, adduction, towel stretch       Patient will benefit from skilled therapeutic intervention in order to improve the following deficits and impairments:  Decreased mobility,  Decreased range of motion, Decreased strength, Hypomobility, Increased muscle  spasms, Impaired flexibility, Impaired UE functional use, Pain  Visit Diagnosis: Chronic left shoulder pain  Stiffness of left shoulder, not elsewhere classified     Problem List Patient Active Problem List   Diagnosis Date Noted  . Essential hypertension   . GERD (gastroesophageal reflux disease)   . Obesity   . PVC's (premature ventricular contractions)   . HTN (hypertension)   . PLANTAR FASCIITIS, RIGHT 09/30/2010  . ALLERGIC RHINITIS, SEASONAL 10/09/2009    Dorene Ar, PTA 12/20/2019, 8:47 AM  [place], [place], [address] Phone: [phone]   Fax:  [phone]  Name: Sheila Nelson MRN: [id number] Date of Birth: 11-22-1960

## 2019-12-22 ENCOUNTER — Other Ambulatory Visit: Payer: Self-pay | Admitting: Family Medicine

## 2019-12-22 DIAGNOSIS — Z1231 Encounter for screening mammogram for malignant neoplasm of breast: Secondary | ICD-10-CM

## 2019-12-26 ENCOUNTER — Ambulatory Visit
Admission: RE | Admit: 2019-12-26 | Discharge: 2019-12-26 | Disposition: A | Discharge status: Home / Self Care | Payer: 59 | Source: Ambulatory Visit | Attending: Family Medicine | Admitting: Family Medicine

## 2019-12-26 ENCOUNTER — Other Ambulatory Visit: Payer: Self-pay

## 2019-12-26 DIAGNOSIS — Z1231 Encounter for screening mammogram for malignant neoplasm of breast: Secondary | ICD-10-CM

## 2019-12-27 ENCOUNTER — Ambulatory Visit: Payer: 59 | Admitting: Physical Therapy

## 2020-01-15 DIAGNOSIS — F4322 Adjustment disorder with anxiety: Secondary | ICD-10-CM | POA: Diagnosis not present

## 2020-01-15 DIAGNOSIS — Z1151 Encounter for screening for human papillomavirus (HPV): Secondary | ICD-10-CM | POA: Diagnosis not present

## 2020-01-15 DIAGNOSIS — Z6831 Body mass index (BMI) 31.0-31.9, adult: Secondary | ICD-10-CM | POA: Diagnosis not present

## 2020-01-15 DIAGNOSIS — Z0001 Encounter for general adult medical examination with abnormal findings: Secondary | ICD-10-CM | POA: Diagnosis not present

## 2020-01-15 DIAGNOSIS — L299 Pruritus, unspecified: Secondary | ICD-10-CM | POA: Diagnosis not present

## 2020-01-15 DIAGNOSIS — R7303 Prediabetes: Secondary | ICD-10-CM | POA: Diagnosis not present

## 2020-01-15 DIAGNOSIS — E669 Obesity, unspecified: Secondary | ICD-10-CM | POA: Diagnosis not present

## 2020-01-15 DIAGNOSIS — Z1279 Encounter for screening for malignant neoplasm of other genitourinary organs: Secondary | ICD-10-CM | POA: Diagnosis not present

## 2020-01-15 DIAGNOSIS — J301 Allergic rhinitis due to pollen: Secondary | ICD-10-CM | POA: Diagnosis not present

## 2020-01-15 DIAGNOSIS — K219 Gastro-esophageal reflux disease without esophagitis: Secondary | ICD-10-CM | POA: Diagnosis not present

## 2020-01-15 DIAGNOSIS — I10 Essential (primary) hypertension: Secondary | ICD-10-CM | POA: Diagnosis not present

## 2020-01-15 DIAGNOSIS — Z113 Encounter for screening for infections with a predominantly sexual mode of transmission: Secondary | ICD-10-CM | POA: Diagnosis not present

## 2020-01-15 DIAGNOSIS — I493 Ventricular premature depolarization: Secondary | ICD-10-CM | POA: Diagnosis not present

## 2020-01-15 DIAGNOSIS — H5789 Other specified disorders of eye and adnexa: Secondary | ICD-10-CM | POA: Diagnosis not present

## 2020-01-15 MED FILL — valACYclovir HCL 1 GM TABS: 1 | 80 days supply | Qty: 90 | Fill #0

## 2020-01-18 NOTE — Therapy (Signed)
[place], [place], [address] Phone: [phone]   Fax:  [phone]  Physical Therapy Treatment/Discharge  Patient Details  Name: Sheila Nelson MRN: [id number] Date of Birth: [birth year] Referring Provider (PT): Jenetta Loges   Encounter Date: 12/20/2019    Past Medical History:  Diagnosis Date  . Allergy   . Arthritis    feet and ankles  . ASTHMA UNSPECIFIED WITH EXACERBATION   . Borderline diabetes   . Chest pain    a. 09/2015 felt to be non cardiac after LHC with no CAD   . Fasciculation 08/04/2017  . Gallstones   . GERD (gastroesophageal reflux disease)   . H/O varicella   . Herpes   . HTN (hypertension)   . Hx of viral meningitis   . Obesity   . Pancreatitis    Viral  . PLANTAR FASCIITIS, RIGHT 09/30/2010  . PVC's (premature ventricular contractions)   . REACTIVE AIRWAY DISEASE 10/09/2009  . TINNITUS 10/09/2009    Past Surgical History:  Procedure Laterality Date  . APPENDECTOMY  1992  . CARDIAC CATHETERIZATION N/A 09/17/2015   Procedure: Left Heart Cath and Coronary Angiography;  Surgeon: Burnell Blanks, MD;  Location: [facility];  Service: Cardiovascular;  Laterality: N/A;  . CESAREAN SECTION  06/23/2012   Procedure: CESAREAN SECTION;  Surgeon: Lovenia Kim, MD;  Location: [facility];  Service: Obstetrics;  Laterality: N/A;  . CHOLECYSTECTOMY N/A 10/05/2016   Procedure: LAPAROSCOPIC CHOLECYSTECTOMY;  Surgeon: Fanny Skates, MD;  Location: [facility];  Service: General;  Laterality: N/A;  . DILATION AND CURETTAGE OF UTERUS  2001  . INGUINAL HERNIA REPAIR     Infant, bilateral  . MOUTH SURGERY    . Right thigh surgery  1973   Trauma    There were no vitals filed for this visit.                                  PT Long Term Goals - 11/15/19 0931      PT LONG TERM GOAL #1   Title  Patient will be independent with HEP for flexibility and  strength    Time  6    Period  Weeks    Status  New    Target Date  12/27/19      PT LONG TERM GOAL #2   Title  Patient to report pain no greater than 3/10 with daily activities.    Time  6    Period  Weeks    Status  New    Target Date  12/27/19      PT LONG TERM GOAL #3   Title  Patient to demonstrate AROM L shoulder at least 160 flexion, 150 abduction and 80 ER    Time  6    Period  Weeks    Status  New    Target Date  12/27/19      PT LONG TERM GOAL #4   Title  Patient to reports ability to perform leisure activities including cycling and fishing with 50% less pain.    Time  6    Period  Weeks    Status  New    Target Date  12/27/19      PT LONG TERM GOAL #5   Title  Patient to report no greater than 35% limitation on FOTO outcome survey.    Time  6  Period  Weeks    Status  New              Patient will benefit from skilled therapeutic intervention in order to improve the following deficits and impairments:  Decreased mobility, Decreased range of motion, Decreased strength, Hypomobility, Increased muscle spasms, Impaired flexibility, Impaired UE functional use, Pain  Visit Diagnosis: Chronic left shoulder pain  Stiffness of left shoulder, not elsewhere classified     Problem List Patient Active Problem List   Diagnosis Date Noted  . Essential hypertension   . GERD (gastroesophageal reflux disease)   . Obesity   . PVC's (premature ventricular contractions)   . HTN (hypertension)   . PLANTAR FASCIITIS, RIGHT 09/30/2010  . ALLERGIC RHINITIS, SEASONAL 10/09/2009        PHYSICAL THERAPY DISCHARGE SUMMARY  Visits from Start of Care: 4  Current functional level related to goals / functional outcomes: Patient did not return for further therapy visits after last session 12/20/19. Plan continue with HEP and follow up MD with any future changes in status.   Remaining deficits: NA   Education / Equipment: HEP Plan: Patient agrees to  discharge.  Patient goals were partially met. Patient is being discharged due to not returning since the last visit.  ?????           Beaulah Dinning, PT, DPT 01/18/20 10:05 AM      [facility] [address] [place], [place], [address] Phone: [phone]   Fax:  [phone]  Name: Sheila Nelson MRN: [id number] Date of Birth: [birth year]

## 2020-01-23 MED FILL — CHLORHEXIDINE 0.12% RINSE: 0.12 | 16 days supply | Qty: 473 | Fill #0

## 2020-01-23 MED FILL — AZITHROMYCIN 250 MG TABLET: 250 | 1 days supply | Qty: 2 | Fill #0

## 2020-02-05 DIAGNOSIS — F4322 Adjustment disorder with anxiety: Secondary | ICD-10-CM | POA: Diagnosis not present

## 2020-02-09 ENCOUNTER — Other Ambulatory Visit: Payer: Self-pay | Admitting: *Deleted

## 2020-02-09 DIAGNOSIS — M25569 Pain in unspecified knee: Secondary | ICD-10-CM

## 2020-02-12 ENCOUNTER — Other Ambulatory Visit: Payer: Self-pay

## 2020-02-12 ENCOUNTER — Ambulatory Visit (HOSPITAL_COMMUNITY)
Admission: RE | Admit: 2020-02-12 | Discharge: 2020-02-12 | Disposition: A | Discharge status: Home / Self Care | Payer: 59 | Source: Ambulatory Visit | Attending: Surgery | Admitting: Surgery

## 2020-02-12 ENCOUNTER — Ambulatory Visit: Payer: 59 | Admitting: Physician Assistant

## 2020-02-12 VITALS — BP 119/84 | HR 78 | Temp 98.2°F | Resp 20 | Ht 64.0 in | Wt 188.4 lb

## 2020-02-12 DIAGNOSIS — M7989 Other specified soft tissue disorders: Secondary | ICD-10-CM

## 2020-02-12 DIAGNOSIS — M25569 Pain in unspecified knee: Secondary | ICD-10-CM | POA: Diagnosis not present

## 2020-02-12 NOTE — Progress Notes (Signed)
[facility]           OF [place]  History and Physical   Roselyn Doby is a 59 y.o. female who presents with leg swelling, which has been present for most of her adult life.  She states that she had a right leg traumatic injury as a child and was impaled by a piece of rotten wood.  She states there were no venous or arterial injuries.   She reports a 40lb weight loss over the past year and states this has helped with her legs.  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 She states she gets some dependent edema and is better in the mornings.  She does have a pool and does some exercising in the pool.  She has hx of pregnancy and did not have much issues while pregnant, but symptoms seemed to worsen after pregnancy.  The patient has no history of DVT. Pt does have history of varicose veins. Pt does does history of skin changes in lower legs.   There is a family history of venous disorders as her mother has hx of DVT and ITP prior to her passing.   The patient has used knee high compression stockings intermittently especially if she is going on a hike.    The pt is not on a statin for cholesterol management.  The pt is not on a daily aspirin.   Other AC:  none The pt is on CCB for hypertension.   The pt is not diabetic.   Tobacco hx:  Never  Her sister died suddenly of what was felt to be a cardiac disorder but no autopsy was performed.     Past Medical History:  Diagnosis Date  . Allergy   . Arthritis    feet and ankles  . ASTHMA UNSPECIFIED WITH EXACERBATION   . Borderline diabetes   . Chest pain    a. 09/2015 felt to be non cardiac after LHC with no CAD   . Fasciculation 08/04/2017  . Gallstones   . GERD (gastroesophageal reflux disease)   . H/O varicella   . Herpes   . HTN (hypertension)   . Hx of viral meningitis   . Obesity   . Pancreatitis    Viral  .  PLANTAR FASCIITIS, RIGHT 09/30/2010  . PVC's (premature ventricular contractions)   . REACTIVE AIRWAY DISEASE 10/09/2009  . TINNITUS 10/09/2009    Past Surgical History:  Procedure Laterality Date  . APPENDECTOMY  1992  . CARDIAC CATHETERIZATION N/A 09/17/2015   Procedure: Left Heart Cath and Coronary Angiography;  Surgeon: Burnell Blanks, MD;  Location: [facility];  Service: Cardiovascular;  Laterality: N/A;  . CESAREAN SECTION  06/23/2012   Procedure: CESAREAN SECTION;  Surgeon: Lovenia Kim, MD;  Location: [facility];  Service: Obstetrics;  Laterality: N/A;  . CHOLECYSTECTOMY N/A 10/05/2016   Procedure: LAPAROSCOPIC CHOLECYSTECTOMY;  Surgeon: Fanny Skates, MD;  Location: [facility];  Service: General;  Laterality: N/A;  . DILATION AND CURETTAGE OF UTERUS  2001  . INGUINAL HERNIA REPAIR     Infant, bilateral  . MOUTH SURGERY    . Right thigh surgery  1973   Trauma    Social History   Socioeconomic History  . Marital status: Married    Spouse name: Lambert Mody  . Number of children:  1  . Years of education: 72  . Highest education level: Bachelor's degree (e.g., BA, AB, BS)  Occupational History  . Occupation: Programmer, multimedia: [employer]  Tobacco Use  . Smoking status: Never Smoker  . Smokeless tobacco: Never Used  . Tobacco comment: Lives with spouse s/p wedding 07/2010. Employed as RN-pediatric critical care; now [place] Use  . Vaping Use: Never used  Substance and Sexual Activity  . Alcohol use: Yes    Comment: 3-5 glasses of wine per week  . Drug use: No  . Sexual activity: Yes    Partners: Male  Other Topics Concern  . Not on file  Social History Narrative   Lives with partner and son.   1-2 cups caffeine per day.   Right-handed.   Social Determinants of Health   Financial Resource Strain:   . Difficulty of Paying Living Expenses:   Food Insecurity:   . Worried About Charity fundraiser in the Last Year:   . Arboriculturist in the Last Year:     Transportation Needs:   . Film/video editor (Medical):   Marland Kitchen Lack of Transportation (Non-Medical):   Physical Activity:   . Days of Exercise per Week:   . Minutes of Exercise per Session:   Stress:   . Feeling of Stress :   Social Connections:   . Frequency of Communication with Friends and Family:   . Frequency of Social Gatherings with Friends and Family:   . Attends Religious Services:   . Active Member of Clubs or Organizations:   . Attends Archivist Meetings:   Marland Kitchen Marital Status:   Intimate Partner Violence:   . Fear of Current or Ex-Partner:   . Emotionally Abused:   Marland Kitchen Physically Abused:   . Sexually Abused:      Family History  Problem Relation Age of Onset  . Prostate cancer Father   . Stroke Father 69  . Heart failure Father   . Hypertension Father   . Cancer Father        prostate  . Valvular heart disease Mother        MVR  . Thrombocytopenia Mother   . Heart disease Mother   . Birth defects Sister        tetralogy of falot  . Hypertension Brother   . Prostate cancer Brother   . Stroke Maternal Grandmother   . Hypertension Brother   . Hypertension Brother   . Colon cancer Neg Hx   . Esophageal cancer Neg Hx   . Stomach cancer Neg Hx   . Rectal cancer Neg Hx     Current Outpatient Medications  Medication Sig Dispense Refill  . amLODipine (NORVASC) 2.5 MG tablet Take 1 tablet (2.5 mg total) by mouth daily. 90 tablet 3  . B Complex Vitamins (VITAMIN B COMPLEX PO) Take by mouth daily.    . hydrochlorothiazide (HYDRODIURIL) 25 MG tablet Take 1 tablet (25 mg total) by mouth daily. 90 tablet 3  . IBUPROFEN PO Take 600 mg by mouth as needed.     . MULTIPLE VITAMIN PO Take 1 tablet by mouth daily.    . Omega-3 Fatty Acids (OMEGA 3 PO) Take 2 capsules by mouth daily.    . ondansetron (ZOFRAN) 4 MG tablet Take 1 tablet (4 mg total) by mouth every 6 (six) hours. 12 tablet 0  . valACYclovir (VALTREX) 1000 MG tablet Take 1 tablet (1,000 mg total)  by mouth daily. 30 tablet 11   No current facility-administered medications for this visit.    No Known Allergies  REVIEW OF SYSTEMS:   [X]  denotes positive finding, [ ]  denotes negative finding Cardiac  Comments:  Chest pain or chest pressure:    Shortness of breath upon exertion:    Short of breath when lying flat:    Irregular heart rhythm:        Vascular    Pain in calf, thigh, or hip brought on by ambulation:    Pain in feet at night that wakes you up from your sleep:     Blood clot in your veins:    Leg swelling:  x       Pulmonary    Oxygen at home:    Productive cough:     Wheezing:         Neurologic    Sudden weakness in arms or legs:     Sudden numbness in arms or legs:     Sudden onset of difficulty speaking or slurred speech:    Temporary loss of vision in one eye:     Problems with dizziness:         Gastrointestinal    Blood in stool:     Vomited blood:         Genitourinary    Burning when urinating:     Blood in urine:        Psychiatric    Major depression:         Hematologic    Bleeding problems:    Problems with blood clotting too easily:        Skin    Rashes or ulcers:        Constitutional    Fever or chills:      PHYSICAL EXAMINATION:  Today's Vitals   02/12/20 1452 02/12/20 1456  BP:  119/84  Pulse:  78  Resp:  20  Temp:  98.2 F (36.8 C)  TempSrc:  Temporal  SpO2:  95%  Weight:  188 lb 6.4 oz (85.5 kg)  Height:  5\' 4"  (1.626 m)  PainSc: 2     Body mass index is 32.34 kg/m.   General:  WDWN in NAD; vital signs documented above Gait: Not observed HENT: WNL, normocephalic Pulmonary: normal non-labored breathing without wheezing Cardiac: regular HR; without carotid bruits Abdomen: soft, NT, no masses; her aorta is palpable Skin: without rashes Vascular Exam/Pulses:  Right Left  Radial 2+ (normal) 2+ (normal)  Popliteal Unable to palpate  Unable to palpate   DP Unable to palpate  1+ (weak)  PT 2+ (normal)  Unable to palpate    Extremities: without ischemic changes, without cellulitis; without open wounds; with some lower extremity skin color changes        Musculoskeletal: no muscle wasting or atrophy  Neurologic: A&O X 3;  moving all extremities equally Psychiatric:  The pt has Normal affect.   Non-Invasive Vascular Imaging:   Venous duplex on 02/12/2020: Venous Reflux Times  +--------------+---------+------+-----------+------------+--------+  RIGHT     Reflux NoRefluxReflux TimeDiameter cmsComments               Yes                   +--------------+---------+------+-----------+------------+--------+  CFV            yes  >1 second             +--------------+---------+------+-----------+------------+--------+  FV prox  no                         +--------------+---------+------+-----------+------------+--------+  FV mid    no                         +--------------+---------+------+-----------+------------+--------+  FV dist    no                         +--------------+---------+------+-----------+------------+--------+  Popliteal   no                         +--------------+---------+------+-----------+------------+--------+  GSV at SFJ        yes  >500 ms   0.925        +--------------+---------+------+-----------+------------+--------+  GSV prox thigh      yes  >500 ms   0.721        +--------------+---------+------+-----------+------------+--------+  GSV mid thigh       yes  >500 ms   0.705        +--------------+---------+------+-----------+------------+--------+  GSV dist thighno               0.690        +--------------+---------+------+-----------+------------+--------+  GSV at knee                   0.357        +--------------+---------+------+-----------+------------+--------+  GSV prox calf                 0.365        +--------------+---------+------+-----------+------------+--------+  GSV mid calf                 0.394        +--------------+---------+------+-----------+------------+--------+  SSV Pop Fossa                 0.219        +--------------+---------+------+-----------+------------+--------+  SSV prox calf no               0.468        +--------------+---------+------+-----------+------------+--------+  SSV mid calf no               0.354        +--------------+---------+------+-----------+------------+--------+     +--------------+---------+------+-----------+------------+--------+  LEFT     Reflux NoRefluxReflux TimeDiameter cmsComments               Yes                   +--------------+---------+------+-----------+------------+--------+  CFV      no                         +--------------+---------+------+-----------+------------+--------+  FV prox    no                         +--------------+---------+------+-----------+------------+--------+  FV mid    no                         +--------------+---------+------+-----------+------------+--------+  FV dist    no                         +--------------+---------+------+-----------+------------+--------+  Popliteal   no                         +--------------+---------+------+-----------+------------+--------+  GSV at [facility]        yes  >500 ms   0.498          +--------------+---------+------+-----------+------------+--------+  GSV prox thighno               0.413        +--------------+---------+------+-----------+------------+--------+  GSV mid thigh no               0.413        +--------------+---------+------+-----------+------------+--------+  GSV dist thighno               0.445        +--------------+---------+------+-----------+------------+--------+  GSV at knee  no               0.335        +--------------+---------+------+-----------+------------+--------+  GSV prox calf                 0.333        +--------------+---------+------+-----------+------------+--------+  GSV mid calf                 0.338        +--------------+---------+------+-----------+------------+--------+  SSV Pop Fossa                 0.411        +--------------+---------+------+-----------+------------+--------+  SSV prox calf no               0.284        +--------------+---------+------+-----------+------------+--------+  SSV mid calf no               0.324        +--------------+---------+------+-----------+------------+--------+        Summary:  Bilateral:  - No evidence of deep vein thrombosis seen in the lower extremities,  bilaterally, from the common femoral through the popliteal veins.  - No evidence of superficial venous thrombosis in the lower extremities,  bilaterally.    Right:  - No evidence of superficial venous reflux seen in the right short  saphenous vein.  - Venous reflux is noted in the right common femoral vein.  - Venous reflux is noted in the right sapheno-femoral junction.  - Venous reflux is noted in the right greater saphenous vein in the thigh.    Left:  - No evidence of superficial venous  reflux seen in the left short  saphenous vein.  - Venous reflux is noted in the left sapheno-femoral junction.      Adlyn Fife is a 59 y.o. female who presents with: BLE leg swelling and varicosities.    Venous Reflux: Pt does have venous reflux bilaterally at the saphenofemoral junction as well as the right GSV in the proximal and mid thigh with diameter larger in the right > left.  -discussed with pt about wearing thigh high 20-40mmHg compression stockings and elevating legs.  She does have a swimming pool and does exercises in the pool.  She also walks and/or bikes daily.  -discussed importance of weight loss-she has lost 40lbs over the past year or so.  -pt will f/u in 3 months with either Dr. Scot Dock or Dr. Oneida Alar for evaluation for laser ablation.    Palpable aorta -her aorta is palpable.  She states that she does not know of any AAA in her family, however, her sister died suddenly and was felt to be related to cardiac issues and an autopsy was not performed.  Will have her return in the next couple of weeks to evaluate her aorta.  She knows to proceed to ER if she develops sudden onset of severe abdominal or back pain.    Leontine Locket, [facility] 02/12/2020 2:21 PM  Clinic MD:  Trula Slade

## 2020-02-13 ENCOUNTER — Other Ambulatory Visit: Payer: Self-pay | Admitting: *Deleted

## 2020-02-13 DIAGNOSIS — R0989 Other specified symptoms and signs involving the circulatory and respiratory systems: Secondary | ICD-10-CM

## 2020-02-16 ENCOUNTER — Ambulatory Visit: Payer: 59

## 2020-02-16 ENCOUNTER — Other Ambulatory Visit (HOSPITAL_COMMUNITY): Payer: 59

## 2020-02-21 ENCOUNTER — Ambulatory Visit (HOSPITAL_COMMUNITY)
Admission: RE | Admit: 2020-02-21 | Discharge: 2020-02-21 | Disposition: A | Discharge status: Home / Self Care | Payer: 59 | Source: Ambulatory Visit | Attending: Vascular Surgery | Admitting: Vascular Surgery

## 2020-02-21 ENCOUNTER — Other Ambulatory Visit: Payer: Self-pay

## 2020-02-21 ENCOUNTER — Ambulatory Visit: Payer: 59 | Admitting: Physician Assistant

## 2020-02-21 VITALS — BP 115/78 | HR 71 | Temp 98.0°F | Resp 20 | Ht 64.0 in | Wt 188.3 lb

## 2020-02-21 DIAGNOSIS — R0989 Other specified symptoms and signs involving the circulatory and respiratory systems: Secondary | ICD-10-CM

## 2020-02-21 NOTE — Progress Notes (Signed)
HISTORY AND PHYSICAL     CC:  follow up Requesting Provider:  Shawnee Knapp, MD  HPI: Sheila Nelson is a 59 y.o. (May 12, 1961) female who presents today for follow up test results.  She was seen by me a couple of weeks ago for leg swelling.   She did have reflux and was measured for 20-61mmHg compression stockings.  She has a follow up appointment in 3 months to be evaluated for laser ablation.     During that visit on physical exam, she did have a palpable aorta.  She had a sister who had died suddenly and it was felt to be related to cardiac issues and an autopsy was not done.  She was brought back for ultrasound.  She has not had any abdominal pain.  She tells me today that her two brothers have been diagnosed with ascending aneurysms.  One brother is 31 and the other 29.   She was evaluated by Dr. Harrington Challenger in 2017.  The pt is not on a statin for cholesterol management.  The pt is not on a daily aspirin.   Other AC:  none The pt is on CCB for hypertension.   The pt is not diabetic.   Tobacco hx:  never   Past Medical History:  Diagnosis Date  . Allergy   . Arthritis    feet and ankles  . ASTHMA UNSPECIFIED WITH EXACERBATION   . Borderline diabetes   . Chest pain    a. 09/2015 felt to be non cardiac after LHC with no CAD   . Fasciculation 08/04/2017  . Gallstones   . GERD (gastroesophageal reflux disease)   . H/O varicella   . Herpes   . HTN (hypertension)   . Hx of viral meningitis   . Obesity   . Pancreatitis    Viral  . PLANTAR FASCIITIS, RIGHT 09/30/2010  . PVC's (premature ventricular contractions)   . REACTIVE AIRWAY DISEASE 10/09/2009  . TINNITUS 10/09/2009    Past Surgical History:  Procedure Laterality Date  . APPENDECTOMY  1992  . CARDIAC CATHETERIZATION N/A 09/17/2015   Procedure: Left Heart Cath and Coronary Angiography;  Surgeon: Burnell Blanks, MD;  Location: [facility];  Service: Cardiovascular;  Laterality: N/A;  . CESAREAN SECTION  06/23/2012    Procedure: CESAREAN SECTION;  Surgeon: Lovenia Kim, MD;  Location: [facility];  Service: Obstetrics;  Laterality: N/A;  . CHOLECYSTECTOMY N/A 10/05/2016   Procedure: LAPAROSCOPIC CHOLECYSTECTOMY;  Surgeon: Fanny Skates, MD;  Location: [facility];  Service: General;  Laterality: N/A;  . DILATION AND CURETTAGE OF UTERUS  2001  . INGUINAL HERNIA REPAIR     Infant, bilateral  . MOUTH SURGERY    . Right thigh surgery  1973   Trauma    Social History   Socioeconomic History  . Marital status: Married    Spouse name: Lambert Mody  . Number of children: 1  . Years of education: 76  . Highest education level: Bachelor's degree (e.g., BA, AB, BS)  Occupational History  . Occupation: Programmer, multimedia: [employer]  Tobacco Use  . Smoking status: Never Smoker  . Smokeless tobacco: Never Used  . Tobacco comment: Lives with spouse s/p wedding 07/2010. 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 Vaping Use: Never used  Substance and Sexual Activity  . Alcohol use: Yes    Comment: 3-5 glasses of wine per week  . Drug use: No  . Sexual  activity: Yes    Partners: Male  Other Topics Concern  . Not on file  Social History Narrative   Lives with partner and son.   1-2 cups caffeine per day.   Right-handed.   Social Determinants of Health   Financial Resource Strain:   . Difficulty of Paying Living Expenses:   Food Insecurity:   . Worried About Charity fundraiser in the Last Year:   . Arboriculturist in the Last Year:   Transportation Needs:   . Film/video editor (Medical):   Marland Kitchen Lack of Transportation (Non-Medical):   Physical Activity:   . Days of Exercise per Week:   . Minutes of Exercise per Session:   Stress:   . Feeling of Stress :   Social Connections:   . Frequency of Communication with Friends and Family:   . Frequency of Social Gatherings with Friends and Family:   . Attends Religious Services:   . Active Member of Clubs or Organizations:   . Attends English as a second language teacher Meetings:   Marland Kitchen Marital Status:   Intimate Partner Violence:   . Fear of Current or Ex-Partner:   . Emotionally Abused:   Marland Kitchen Physically Abused:   . Sexually Abused:     Family History  Problem Relation Age of Onset  . Prostate cancer Father   . Stroke Father 62  . Heart failure Father   . Hypertension Father   . Cancer Father        prostate  . Valvular heart disease Mother        MVR  . Thrombocytopenia Mother   . Heart disease Mother   . Birth defects Sister        tetralogy of falot  . Hypertension Brother   . Prostate cancer Brother   . Stroke Maternal Grandmother   . Hypertension Brother   . Hypertension Brother   . Colon cancer Neg Hx   . Esophageal cancer Neg Hx   . Stomach cancer Neg Hx   . Rectal cancer Neg Hx     Current Outpatient Medications  Medication Sig Dispense Refill  . amLODipine (NORVASC) 2.5 MG tablet Take 1 tablet (2.5 mg total) by mouth daily. (Patient taking differently: Take 5 mg by mouth daily. ) 90 tablet 3  . azithromycin (ZITHROMAX) 250 MG tablet TAKE 2 TABS, 1 HOUR PRIOR TO SURGERY.    . B Complex Vitamins (VITAMIN B COMPLEX PO) Take by mouth daily.     . chlorhexidine (PERIDEX) 0.12 % solution SMARTSIG:1 Capful(s) By Mouth Twice Daily    . Cholecalciferol (VITAMIN D) 125 MCG (5000 UT) CAPS Take by mouth.    . estradiol (ESTRACE) 0.1 MG/GM vaginal cream INSERT 2 GM VAGINALLY DAILY FOR 2 WEEKS, THEN 1 GM VAGINALLY DAILY FOR 1 WEEK THEN 1 GM EVERY OTHER DAY FOR 1 WEEK THEN 1 GM VAGINALLY TWICE    . hydrochlorothiazide (HYDRODIURIL) 25 MG tablet Take 1 tablet (25 mg total) by mouth daily. 90 tablet 3  . IBUPROFEN PO Take 600 mg by mouth as needed.     . MULTIPLE VITAMIN PO Take 1 tablet by mouth daily.    . Omega-3 Fatty Acids (OMEGA 3 PO) Take 2 capsules by mouth daily.    . ondansetron (ZOFRAN) 4 MG tablet Take 1 tablet (4 mg total) by mouth every 6 (six) hours. 12 tablet 0  . valACYclovir (VALTREX) 1000 MG tablet Take 1 tablet  (1,000 mg total) by mouth daily. [place]  tablet 11  . vitamin k 100 MCG tablet Take 100 mcg by mouth daily.     No current facility-administered medications for this visit.    No Known Allergies   REVIEW OF SYSTEMS:   [X]  denotes positive finding, [ ]  denotes negative finding Cardiac  Comments:  Chest pain or chest pressure:    Shortness of breath upon exertion:    Short of breath when lying flat:    Irregular heart rhythm:        Vascular    Pain in calf, thigh, or hip brought on by ambulation:    Pain in feet at night that wakes you up from your sleep:     Blood clot in your veins:    Leg swelling:         Pulmonary    Oxygen at home:    Productive cough:     Wheezing:         Neurologic    Sudden weakness in arms or legs:     Sudden numbness in arms or legs:     Sudden onset of difficulty speaking or slurred speech:    Temporary loss of vision in one eye:     Problems with dizziness:         Gastrointestinal    Blood in stool:     Vomited blood:         Genitourinary    Burning when urinating:     Blood in urine:        Psychiatric    Major depression:         Hematologic    Bleeding problems:    Problems with blood clotting too easily:        Skin    Rashes or ulcers:        Constitutional    Fever or chills:      PHYSICAL EXAMINATION:  Today's Vitals   02/21/20 0844  BP: 115/78  Pulse: 71  Resp: 20  Temp: 98 F (36.7 C)  TempSrc: Temporal  SpO2: 97%  Weight: 188 lb 4.8 oz (85.4 kg)  Height: 5\' 4"  (1.626 m)  PainSc: 0-No pain   Body mass index is 32.32 kg/m.   General:  WDWN in NAD; vital signs documented above Gait: Normal HENT: WNL, normocephalic Pulmonary: normal non-labored breathing Abdomen: obese Skin: without rashes Extremities: without ischemic changes, without Gangrene , without cellulitis; without open wounds;  Musculoskeletal: no muscle wasting or atrophy  Neurologic: A&O X 3;  No focal weakness or paresthesias are  detected Psychiatric:  The pt has Normal affect.   Non-Invasive Vascular Imaging on 02/21/2020:   Abdominal Aorta Findings:  +-----------+-------+----------+----------+---------+--------+--------+  Location  AP (cm)Trans (cm)PSV (cm/s)Waveform ThrombusComments  +-----------+-------+----------+----------+---------+--------+--------+  Proximal  2.24  2.29   118    triphasic          +-----------+-------+----------+----------+---------+--------+--------+  Mid    2.18  2.16   103    triphasic          +-----------+-------+----------+----------+---------+--------+--------+  Distal   1.98  1.86   64    triphasic          +-----------+-------+----------+----------+---------+--------+--------+  RT CIA Prox1.3  1.3    110    triphasic          +-----------+-------+----------+----------+---------+--------+--------+  LT CIA Prox1.3  1.2    103    triphasic          +-----------+-------+----------+----------+---------+--------+--------+     ASSESSMENT/PLAN:: 59 y.o. female here for follow  up for AAA duplex   -duplex today does not reveal any evidence of AAA despite palpable aorta.  She tells me today that she has two brothers who have been diagnosed with ascending aortic aneurysms.  She has been seen by Dr. Harrington Challenger in the past.  Advised her that given the familial nature of aortic aneurysms, she could get back into see them to be evaluated. -pt has appt to follow up in 3 months for evaluation for laser ablation.   Leontine Locket, [facility] [phone]  Clinic MD:   Scot Dock

## 2020-03-01 MED FILL — AMLODIPINE BESYLATE 5 MG TA: 5 | 90 days supply | Qty: 90 | Fill #1

## 2020-03-01 MED FILL — HYDROCHLOROTHIAZIDE 25 MG T: 25 | 90 days supply | Qty: 90 | Fill #1

## 2020-04-02 ENCOUNTER — Ambulatory Visit: Payer: 59 | Admitting: Critical Care Medicine

## 2020-04-02 ENCOUNTER — Other Ambulatory Visit: Payer: Self-pay

## 2020-04-02 VITALS — BP 133/88 | HR 66 | Temp 98.7°F | Resp 18 | Ht 64.0 in | Wt 180.0 lb

## 2020-04-02 DIAGNOSIS — I10 Essential (primary) hypertension: Secondary | ICD-10-CM | POA: Diagnosis not present

## 2020-04-02 DIAGNOSIS — Z9189 Other specified personal risk factors, not elsewhere classified: Secondary | ICD-10-CM | POA: Diagnosis not present

## 2020-04-02 DIAGNOSIS — H5712 Ocular pain, left eye: Secondary | ICD-10-CM

## 2020-04-02 HISTORY — DX: Ocular pain, left eye: H57.12

## 2020-04-02 LAB — POC COVID19 BINAXNOW: SARS Coronavirus 2 Ag: NEGATIVE

## 2020-04-02 NOTE — Assessment & Plan Note (Addendum)
Left eye pain of unclear etiology it does not appear to be conjunctivitis I am concerned about increased intraocular pressure as a potential cause of the pain in the left eye  The pt was concerned about Covid but this is not a covid type illness .  Covid rapid Ag test was NEG Will send out pcr covid test  I spoke with ophthalmologist Josephina Shih agrees to see the patient in the morning in his clinic I gave the patient the location of the clinic and told her the clinic will call her before 8:45 in the morning for a timeframe for appointment  Her pain condition is not acute but if her condition worsens she knows to go to the emergency room

## 2020-04-02 NOTE — Patient Instructions (Signed)
Dr  Katy Apo of [facility] will see you tomorrow  His office will call you before 845am to make the appointment  No other changes in medications

## 2020-04-02 NOTE — Assessment & Plan Note (Signed)
History of hypertension currently well controlled back back on hydrochlorthiazide 25 mg daily

## 2020-04-02 NOTE — Progress Notes (Signed)
Subjective:    Patient ID: Sheila Nelson, female    DOB: 21-Jun-1961, 59 y.o.   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  She is to the point now she has severe pain in the left eye photosensitivity and a burning sensation.  The pain radiates into the nose and causes postnasal drip.  She has also headaches back behind the eye itself.  She denies any oral concerns sore throat or left ear pain.  She denies any sinus congestion or sinus pressure.  She denies nausea or vomiting.  There is no rash.  She has no respiratory complaints at this time.  The patient does note decreased visual acuity in the left eye the right eye is normal and has no symptoms  The patient has been treated for herpes simplex recurrent infections and is currently on 1 g of Valtrex daily for the past week.  Patient currently does not have an active ophthalmologist.  She does have a primary care provider but was not able to get in to be seen same day appointment so she came to the mobile medicine urgent care clinic.    The pt also wishes covid test due to possible exposure and there are no symptoms c/w covid illness on presentation  Past Medical History:  Diagnosis Date  . Allergy   . Arthritis    feet and ankles  . ASTHMA UNSPECIFIED WITH EXACERBATION   . Borderline diabetes   . Chest pain    a. 09/2015 felt to be non cardiac after LHC with no CAD   . Fasciculation 08/04/2017  . Gallstones   . GERD (gastroesophageal reflux disease)   . H/O varicella   . Herpes   . HTN (hypertension)   . Hx of viral meningitis   . Obesity   . Pancreatitis    Viral  . PLANTAR FASCIITIS, RIGHT 09/30/2010  . PVC's (premature ventricular contractions)   . REACTIVE AIRWAY DISEASE 10/09/2009  . TINNITUS 10/09/2009     Family  History  Problem Relation Age of Onset  . Prostate cancer Father   . Stroke Father 13  . Heart failure Father   . Hypertension Father   . Cancer Father        prostate  . Valvular heart disease Mother        MVR  . Thrombocytopenia Mother   . Heart disease Mother   . Birth defects Sister        tetralogy of falot  . Hypertension Brother   . Prostate cancer Brother   . Stroke Maternal Grandmother   . Hypertension Brother   . Hypertension Brother   . Colon cancer Neg Hx   . Esophageal cancer Neg Hx   . Stomach cancer Neg Hx   . Rectal cancer Neg Hx      Social History   Socioeconomic History  . Marital status: Married    Spouse name: Lambert Mody  . Number of children: 1  . Years of education: 28  . Highest education level: Bachelor's degree (e.g., BA, AB, BS)  Occupational History  . Occupation: Programmer, multimedia: [employer]  Tobacco Use  . Smoking status: Never Smoker  . Smokeless tobacco: Never Used  . Tobacco comment: Lives with spouse s/p wedding 07/2010. Employed as  RN-pediatric critical care; now [employer]  Vaping Use  . Vaping Use: Never used  Substance and Sexual Activity  . Alcohol use: Yes    Comment: 3-5 glasses of wine per week  . Drug use: No  . Sexual activity: Yes    Partners: Male  Other Topics Concern  . Not on file  Social History Narrative   Lives with partner and son.   1-2 cups caffeine per day.   Right-handed.   Social Determinants of Health   Financial Resource Strain:   . Difficulty of Paying Living Expenses:   Food Insecurity:   . Worried About Charity fundraiser in the Last Year:   . Arboriculturist in the Last Year:   Transportation Needs:   . Film/video editor (Medical):   Marland Kitchen Lack of Transportation (Non-Medical):   Physical Activity:   . Days of Exercise per Week:   . Minutes of Exercise per Session:   Stress:   . Feeling of Stress :   Social Connections:   . Frequency of Communication with Friends and Family:   . Frequency of  Social Gatherings with Friends and Family:   . Attends Religious Services:   . Active Member of Clubs or Organizations:   . Attends Archivist Meetings:   Marland Kitchen Marital Status:   Intimate Partner Violence:   . Fear of Current or Ex-Partner:   . Emotionally Abused:   Marland Kitchen Physically Abused:   . Sexually Abused:      No Known Allergies   Outpatient Medications Prior to Visit  Medication Sig Dispense Refill  . amLODipine (NORVASC) 5 MG tablet Take 5 mg by mouth daily.    . B Complex Vitamins (VITAMIN B COMPLEX PO) Take by mouth daily.     . Cholecalciferol (VITAMIN D) 125 MCG (5000 UT) CAPS Take by mouth.    . estradiol (ESTRACE) 0.1 MG/GM vaginal cream INSERT 2 GM VAGINALLY DAILY FOR 2 WEEKS, THEN 1 GM VAGINALLY DAILY FOR 1 WEEK THEN 1 GM EVERY OTHER DAY FOR 1 WEEK THEN 1 GM VAGINALLY TWICE    . hydrochlorothiazide (HYDRODIURIL) 25 MG tablet Take 1 tablet (25 mg total) by mouth daily. 90 tablet 3  . IBUPROFEN PO Take 600 mg by mouth as needed.     . MULTIPLE VITAMIN PO Take 1 tablet by mouth daily.    . valACYclovir (VALTREX) 1000 MG tablet Take 1 tablet (1,000 mg total) by mouth daily. 30 tablet 11  . amLODipine (NORVASC) 10 MG tablet Take 10 mg by mouth daily.    Marland Kitchen amLODipine (NORVASC) 2.5 MG tablet Take 1 tablet (2.5 mg total) by mouth daily. (Patient taking differently: Take 5 mg by mouth daily. ) 90 tablet 3  . Omega-3 Fatty Acids (OMEGA 3 PO) Take 2 capsules by mouth daily.    Marland Kitchen azithromycin (ZITHROMAX) 250 MG tablet TAKE 2 TABS, 1 HOUR PRIOR TO SURGERY. (Patient not taking: Reported on 04/02/2020)    . chlorhexidine (PERIDEX) 0.12 % solution SMARTSIG:1 Capful(s) By Mouth Twice Daily (Patient not taking: Reported on 04/02/2020)    . ondansetron (ZOFRAN) 4 MG tablet Take 1 tablet (4 mg total) by mouth every 6 (six) hours. (Patient not taking: Reported on 04/02/2020) 12 tablet 0  . vitamin k 100 MCG tablet Take 100 mcg by mouth daily. (Patient not taking: Reported on 04/02/2020)     No  facility-administered medications prior to visit.      Review of Systems  Constitutional: Negative.  Negative for activity change.  HENT: Positive for postnasal drip and rhinorrhea. Negative for congestion, ear discharge, ear pain, facial swelling, hearing loss, mouth sores, sinus pressure, sinus pain, sneezing, sore throat, tinnitus and trouble swallowing.   Eyes: Positive for photophobia, pain, itching and visual disturbance.  Respiratory: Negative.   Cardiovascular: Negative.   Gastrointestinal: Negative.   Genitourinary: Negative.   Musculoskeletal: Negative.   Neurological: Negative.   Hematological: Negative.   Psychiatric/Behavioral: Negative.        Objective:   Physical Exam Vitals:   04/02/20 1640  BP: 133/88  Pulse: 66  Resp: 18  Temp: 98.7 F (37.1 C)  TempSrc: Oral  SpO2: 100%  Weight: 180 lb (81.6 kg)  Height: 5\' 4"  (1.626 m)    Gen: Pleasant, well-nourished, in no distress,  normal affect  ENT: No lesions,  mouth clear,  oropharynx clear, no postnasal drip In the anterior portion of the left eye the eye is normal in appearance.  There is no conjunctival erythema.  There is no purulence or drainage.  I do not have an ability to dilate the eye but when I examined with him ophthalmoscope I do not see overt lesions in the retina and I can see clear to the back to the retina  Pupils are equal reactive to light and accommodation  Neck: No JVD, no TMG, no carotid bruits  Lungs: No use of accessory muscles, no dullness to percussion, clear without rales or rhonchi  Cardiovascular: RRR, heart sounds normal, no murmur or gallops, no peripheral edema  Abdomen: soft and NT, no HSM,  BS normal  Musculoskeletal: No deformities, no cyanosis or clubbing  Neuro: alert, non focal  Skin: Warm, no lesions or rashes  No results found.        Assessment & Plan:  I personally reviewed all images and lab data in the [facility] system as well as any outside material  available during this office visit and agree with the  radiology impressions.   Left eye pain Left eye pain of unclear etiology it does not appear to be conjunctivitis I am concerned about increased intraocular pressure as a potential cause of the pain in the left eye  The pt was concerned about Covid but this is not a covid type illness .  Covid rapid Ag test was NEG Will send out pcr covid test  I spoke with ophthalmologist Josephina Shih agrees to see the patient in the morning in his clinic I gave the patient the location of the clinic and told her the clinic will call her before 8:45 in the morning for a timeframe for appointment  Her pain condition is not acute but if her condition worsens she knows to go to the emergency room  Essential hypertension History of hypertension currently well controlled back back on hydrochlorthiazide 25 mg daily   Diagnoses and all orders for this visit:  At increased risk of exposure to COVID-19 virus -     POC COVID-19 -     Novel Coronavirus, NAA ([facility])  Left eye pain  Essential hypertension

## 2020-04-03 ENCOUNTER — Other Ambulatory Visit: Payer: Self-pay | Admitting: Ophthalmology

## 2020-04-03 DIAGNOSIS — H4612 Retrobulbar neuritis, left eye: Secondary | ICD-10-CM

## 2020-04-03 DIAGNOSIS — H5712 Ocular pain, left eye: Secondary | ICD-10-CM | POA: Diagnosis not present

## 2020-04-03 LAB — NOVEL CORONAVIRUS, NAA: SARS-CoV-2, NAA: NOT DETECTED

## 2020-04-03 LAB — SARS-COV-2, NAA 2 DAY TAT

## 2020-04-03 LAB — SPECIMEN STATUS REPORT

## 2020-04-04 ENCOUNTER — Telehealth: Payer: Self-pay | Admitting: *Deleted

## 2020-04-04 NOTE — Telephone Encounter (Signed)
-----   Message from Elsie Stain, MD sent at 04/03/2020  7:29 PM EDT ----- Let pt know Covid pcr NEG

## 2020-04-04 NOTE — Telephone Encounter (Signed)
Medical Assistant left message on patient's home and cell voicemail. Patient is aware of results being negative and to provide [place] a call if needed at [phone].

## 2020-04-13 ENCOUNTER — Ambulatory Visit
Admission: RE | Admit: 2020-04-13 | Discharge: 2020-04-13 | Disposition: A | Discharge status: Home / Self Care | Payer: 59 | Source: Ambulatory Visit | Attending: Ophthalmology | Admitting: Ophthalmology

## 2020-04-13 DIAGNOSIS — H5712 Ocular pain, left eye: Secondary | ICD-10-CM | POA: Diagnosis not present

## 2020-04-13 DIAGNOSIS — H4612 Retrobulbar neuritis, left eye: Secondary | ICD-10-CM

## 2020-04-13 MED ORDER — IOPAMIDOL (ISOVUE-370) INJECTION 76%
75.0000 mL | Freq: Once | INTRAVENOUS | Status: AC | PRN
  Administered 2020-04-13: 75 mL via INTRAVENOUS

## 2020-04-16 MED FILL — valACYclovir HCL 1 GM TABS: 1 | 80 days supply | Qty: 90 | Fill #1

## 2020-04-26 ENCOUNTER — Telehealth: Payer: Self-pay | Admitting: Vascular Surgery

## 2020-05-16 ENCOUNTER — Ambulatory Visit: Payer: 59 | Admitting: Vascular Surgery

## 2020-05-22 MED FILL — HYDROCHLOROTHIAZIDE 25 MG T: 25 | 90 days supply | Qty: 90 | Fill #2

## 2020-06-07 MED FILL — AMLODIPINE BESYLATE 5 MG TA: 5 | 90 days supply | Qty: 90 | Fill #2

## 2020-06-24 ENCOUNTER — Other Ambulatory Visit (HOSPITAL_COMMUNITY): Payer: Self-pay

## 2020-06-24 MED FILL — NITROFURANTOIN MONO-MCR 100: 100 | 5 days supply | Qty: 10 | Fill #0

## 2020-06-27 ENCOUNTER — Ambulatory Visit: Payer: 59 | Admitting: Vascular Surgery

## 2020-08-14 MED FILL — HYDROCHLOROTHIAZIDE 25 MG T: 25 | 90 days supply | Qty: 90 | Fill #3

## 2020-09-10 MED FILL — AMLODIPINE BESYLATE 5 MG TA: 5 | 90 days supply | Qty: 90 | Fill #3

## 2020-10-08 ENCOUNTER — Other Ambulatory Visit (HOSPITAL_COMMUNITY): Payer: Self-pay | Admitting: Family Medicine

## 2020-10-08 MED FILL — valACYclovir HCL 1 GM TABS: 1 | 90 days supply | Qty: 90 | Fill #0

## 2020-10-31 ENCOUNTER — Other Ambulatory Visit: Payer: Self-pay | Admitting: Sports Medicine

## 2020-10-31 ENCOUNTER — Other Ambulatory Visit: Payer: Self-pay

## 2020-10-31 ENCOUNTER — Ambulatory Visit: Payer: 59 | Admitting: Sports Medicine

## 2020-10-31 VITALS — BP 124/90 | Ht 64.0 in | Wt 183.0 lb

## 2020-10-31 DIAGNOSIS — M25551 Pain in right hip: Secondary | ICD-10-CM

## 2020-10-31 MED ORDER — CYCLOBENZAPRINE HCL 10 MG PO TABS: ORAL_TABLET | ORAL | 0 refills | Status: DC

## 2020-10-31 MED FILL — CYCLOBENZAPRINE HCL 10 MG T: 10 | 40 days supply | Qty: 40 | Fill #0

## 2020-10-31 NOTE — Patient Instructions (Signed)
Follow up with us in 6 weeks

## 2020-11-01 ENCOUNTER — Ambulatory Visit
Admission: RE | Admit: 2020-11-01 | Discharge: 2020-11-01 | Disposition: A | Discharge status: Home / Self Care | Payer: 59 | Source: Ambulatory Visit | Attending: Sports Medicine | Admitting: Sports Medicine

## 2020-11-01 DIAGNOSIS — M25551 Pain in right hip: Secondary | ICD-10-CM

## 2020-11-01 DIAGNOSIS — M1611 Unilateral primary osteoarthritis, right hip: Secondary | ICD-10-CM | POA: Diagnosis not present
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
## 2020-11-14 ENCOUNTER — Other Ambulatory Visit: Payer: Self-pay

## 2020-11-14 ENCOUNTER — Ambulatory Visit (HOSPITAL_COMMUNITY)
Admission: RE | Admit: 2020-11-14 | Discharge: 2020-11-14 | Disposition: A | Discharge status: Home / Self Care | Payer: 59 | Source: Ambulatory Visit | Attending: Family Medicine | Admitting: Family Medicine

## 2020-11-14 ENCOUNTER — Encounter (HOSPITAL_COMMUNITY): Payer: Self-pay | Admitting: Emergency Medicine

## 2020-11-14 ENCOUNTER — Ambulatory Visit (HOSPITAL_COMMUNITY)
Admission: EM | Admit: 2020-11-14 | Discharge: 2020-11-14 | Disposition: A | Discharge status: Home / Self Care | Payer: 59 | Attending: Family Medicine | Admitting: Family Medicine

## 2020-11-14 DIAGNOSIS — M25551 Pain in right hip: Secondary | ICD-10-CM | POA: Insufficient documentation

## 2020-11-14 DIAGNOSIS — M25559 Pain in unspecified hip: Secondary | ICD-10-CM | POA: Diagnosis not present

## 2020-11-14 DIAGNOSIS — M79604 Pain in right leg: Secondary | ICD-10-CM | POA: Diagnosis not present

## 2020-11-14 DIAGNOSIS — M79661 Pain in right lower leg: Secondary | ICD-10-CM

## 2020-11-14 NOTE — ED Provider Notes (Signed)
[facility]    CSN: [id number] Arrival date & time: 11/14/20  0843      History   Chief Complaint Chief Complaint  Patient presents with  . Hip Pain  . Leg Pain    right    HPI Sheila Nelson is a 60 y.o. female.   Patient presenting today with about a week of acute on chronic right hip pain radiating down to thigh and now the past few days having right calf soreness worse with weight bearing. Denies redness, diffuse leg swelling, known injury to the area, CP, SOB, palpitations, recent surgery, sedentary behavior. She states she has a fhx of DVT and would like to rule this out. Has been working with Sports Medicine for her hip pain but the calf pain is new. Has been trying her flexeril and anti inflammatories for her hip pain without benefit.      Past Medical History:  Diagnosis Date  . Allergy   . Arthritis    feet and ankles  . ASTHMA UNSPECIFIED WITH EXACERBATION   . Borderline diabetes   . Chest pain    a. 09/2015 felt to be non cardiac after LHC with no CAD   . Fasciculation 08/04/2017  . Gallstones   . GERD (gastroesophageal reflux disease)   . H/O varicella   . Herpes   . HTN (hypertension)   . Hx of viral meningitis   . Obesity   . Pancreatitis    Viral  . PLANTAR FASCIITIS, RIGHT 09/30/2010  . PVC's (premature ventricular contractions)   . REACTIVE AIRWAY DISEASE 10/09/2009  . TINNITUS 10/09/2009    Patient Active Problem List   Diagnosis Date Noted  . Left eye pain 04/02/2020  . Essential hypertension   . Obesity     Past Surgical History:  Procedure Laterality Date  . APPENDECTOMY  1992  . CARDIAC CATHETERIZATION N/A 09/17/2015   Procedure: Left Heart Cath and Coronary Angiography;  Surgeon: Burnell Blanks, MD;  Location: [facility];  Service: Cardiovascular;  Laterality: N/A;  . CESAREAN SECTION  06/23/2012   Procedure: CESAREAN SECTION;  Surgeon: Lovenia Kim, MD;  Location: [facility];  Service: Obstetrics;   Laterality: N/A;  . CHOLECYSTECTOMY N/A 10/05/2016   Procedure: LAPAROSCOPIC CHOLECYSTECTOMY;  Surgeon: Fanny Skates, MD;  Location: [facility];  Service: General;  Laterality: N/A;  . DILATION AND CURETTAGE OF UTERUS  2001  . INGUINAL HERNIA REPAIR     Infant, bilateral  . MOUTH SURGERY    . Right thigh surgery  1973   Trauma    OB History    Gravida  2   Para  1   Term  1   Preterm  0   AB  1   Living  1     SAB  0   IAB  1   Ectopic  0   Multiple  0   Live Births  1            Home Medications    Prior to Admission medications   Medication Sig Start Date End Date Taking? Authorizing Provider  amLODipine (NORVASC) 5 MG tablet Take 5 mg by mouth daily. 03/01/20  Yes [provider]  cyclobenzaprine (FLEXERIL) 10 MG tablet Take 1/2-1 tab at bedtime as needed for muscle spasms. 10/31/20  Yes Draper, Carlos Levering, DO  hydrochlorothiazide (HYDRODIURIL) 25 MG tablet Take 1 tablet (25 mg total) by mouth daily. 11/07/18  Yes Shawnee Knapp, MD  IBUPROFEN  PO Take 600 mg by mouth as needed.    Yes [provider]  valACYclovir (VALTREX) 1000 MG tablet Take 1 tablet (1,000 mg total) by mouth daily. 07/21/17  Yes Shawnee Knapp, MD  B Complex Vitamins (VITAMIN B COMPLEX PO) Take by mouth daily.     [provider]  Cholecalciferol (VITAMIN D) 125 MCG (5000 UT) CAPS Take by mouth.    [provider]  estradiol (ESTRACE) 0.1 MG/GM vaginal cream INSERT 2 GM VAGINALLY DAILY FOR 2 WEEKS, THEN 1 GM VAGINALLY DAILY FOR 1 WEEK THEN 1 GM EVERY OTHER DAY FOR 1 WEEK THEN 1 GM VAGINALLY TWICE 11/27/19   [provider]  MULTIPLE VITAMIN PO Take 1 tablet by mouth daily.    [provider]  beclomethasone (QVAR) 80 MCG/ACT inhaler Inhale 2 puffs into the lungs 2 (two) times daily. 08/04/11 11/14/11  Harden Mo, MD    Family History Family History  Problem Relation Age of Onset  . Prostate cancer Father   . Stroke Father 62  . Heart failure  Father   . Hypertension Father   . Cancer Father        prostate  . Valvular heart disease Mother        MVR  . Thrombocytopenia Mother   . Heart disease Mother   . Birth defects Sister        tetralogy of falot  . Hypertension Brother   . Prostate cancer Brother   . Stroke Maternal Grandmother   . Hypertension Brother   . Hypertension Brother   . Colon cancer Neg Hx   . Esophageal cancer Neg Hx   . Stomach cancer Neg Hx   . Rectal cancer Neg Hx     Social History Social History   Tobacco Use  . Smoking status: Never Smoker  . Smokeless tobacco: Never Used  . Tobacco comment: Lives with spouse s/p wedding 07/2010. 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 Vaping Use: Never used  Substance Use Topics  . Alcohol use: Yes    Comment: 3-5 glasses of wine per week  . Drug use: No     Allergies   Patient has no known allergies.   Review of Systems Review of Systems PER HPI    Physical Exam Triage Vital Signs ED Triage Vitals  Enc Vitals Group     BP 11/14/20 0908 122/81     Pulse Rate 11/14/20 0908 73     Resp 11/14/20 0908 16     Temp 11/14/20 0908 98.2 F (36.8 C)     Temp Source 11/14/20 0908 Oral     SpO2 11/14/20 0908 100 %     Weight --      Height --      Head Circumference --      Peak Flow --      Pain Score 11/14/20 0905 4     Pain Loc --      Pain Edu? --      Excl. in [place]? --    No data found.  Updated Vital Signs BP 122/81 (BP Location: Right Arm)   Pulse 73   Temp 98.2 F (36.8 C) (Oral)   Resp 16   LMP 09/19/2011   SpO2 100%   Visual Acuity Right Eye Distance:   Left Eye Distance:   Bilateral Distance:    Right Eye Near:   Left Eye Near:    Bilateral Near:  Physical Exam Vitals and nursing note reviewed.  Constitutional:      Appearance: Normal appearance. She is not ill-appearing.  HENT:     Head: Atraumatic.  Eyes:     Extraocular Movements: Extraocular movements intact.      Conjunctiva/sclera: Conjunctivae normal.  Cardiovascular:     Rate and Rhythm: Normal rate and regular rhythm.     Heart sounds: Normal heart sounds.  Pulmonary:     Effort: Pulmonary effort is normal.     Breath sounds: Normal breath sounds.  Musculoskeletal:        General: Swelling and tenderness (diffuse ttp right posterios and lateral calf) present. No deformity or signs of injury. Normal range of motion.     Cervical back: Normal range of motion and neck supple.     Comments: No point ttp right calf, pain is diffuse but is worse with dorsiflexion of calf  Skin:    General: Skin is warm and dry.     Findings: No erythema.  Neurological:     Mental Status: She is alert and oriented to person, place, and time.  Psychiatric:        Mood and Affect: Mood normal.        Thought Content: Thought content normal.        Judgment: Judgment normal.      UC Treatments / Results  Labs (all labs ordered are listed, but only abnormal results are displayed) Labs Reviewed - No data to display  EKG   Radiology VAS [place] LOWER EXTREMITY VENOUS (DVT) (ONLY [facility])  Result Date: 11/14/2020  Lower Venous DVT Study Indications: Right hip and calf pain, Patient was seen in June 2021 for evaluation for venous reflux.  Comparison Study: 02/19/20 positive for superficial venous reflux right leg Performing Technologist: June Leap RDMS, RVT  Examination Guidelines: A complete evaluation includes B-mode imaging, spectral Doppler, color Doppler, and power Doppler as needed of all accessible portions of each vessel. Bilateral testing is considered an integral part of a complete examination. Limited examinations for reoccurring indications may be performed as noted. The reflux portion of the exam is performed with the patient in reverse Trendelenburg.  +---------+---------------+---------+-----------+----------+--------------+ RIGHT    CompressibilityPhasicitySpontaneityPropertiesThrombus Aging  +---------+---------------+---------+-----------+----------+--------------+ CFV      Full           Yes      Yes                                 +---------+---------------+---------+-----------+----------+--------------+ SFJ      Full                                                        +---------+---------------+---------+-----------+----------+--------------+ FV Prox  Full                                                        +---------+---------------+---------+-----------+----------+--------------+ FV Mid   Full                                                        +---------+---------------+---------+-----------+----------+--------------+  FV DistalFull                                                        +---------+---------------+---------+-----------+----------+--------------+ PFV      Full                                                        +---------+---------------+---------+-----------+----------+--------------+ POP      Full           Yes      Yes                                 +---------+---------------+---------+-----------+----------+--------------+ PTV      Full                                                        +---------+---------------+---------+-----------+----------+--------------+ PERO     Full                                                        +---------+---------------+---------+-----------+----------+--------------+   +----+---------------+---------+-----------+----------+--------------+ LEFTCompressibilityPhasicitySpontaneityPropertiesThrombus Aging +----+---------------+---------+-----------+----------+--------------+ CFV Full           Yes      Yes                                 +----+---------------+---------+-----------+----------+--------------+    Findings reported to Dr. Orene Desanctis at 11:18am.  Summary: RIGHT: - There is no evidence of deep vein thrombosis in the lower extremity.  - No cystic  structure found in the popliteal fossa.  LEFT: - No evidence of common femoral vein obstruction.  *See table(s) above for measurements and observations. Electronically signed by Ruta Hinds MD on 11/14/2020 at 1:58:00 PM.    Final     Procedures Procedures (including critical care time)  Medications Ordered in UC Medications - No data to display  Initial Impression / Assessment and Plan / UC Course  I have reviewed the triage vital signs and the nursing notes.  Pertinent labs & imaging results that were available during my care of the patient were reviewed by me and considered in my medical decision making (see chart for details).     Suspect muscular pain, but will r/o DVT with stat doppler u/s. U/s is scheduled today at 11 am at [facility]. In meantime, flexeril, stretches, massage. ED for worsening sxs.  Final Clinical Impressions(s) / UC Diagnoses   Final diagnoses:  Right calf pain  Hip pain   Discharge Instructions   None    ED Prescriptions    None     PDMP not reviewed this encounter.   Merrie Roof [place], [place] 11/16/20 [phone]

## 2020-11-14 NOTE — ED Notes (Signed)
This RN called [phone] to schedule ultrasound of right leg per R.LanePA verbal order. Scheduled for 11:00 am today 11/14/20 at [facility] located at [address], [place], [place].

## 2020-11-14 NOTE — ED Triage Notes (Addendum)
Pt presents today with c/o of pain to right hip that radiates down right leg into calf that began this morning. Denies current injury.She is concerned because she has decreased vascular supply to right leg. Family history of DVTs.

## 2020-11-18 ENCOUNTER — Ambulatory Visit: Payer: 59 | Admitting: Sports Medicine

## 2020-11-21 ENCOUNTER — Ambulatory Visit: Payer: 59 | Attending: Sports Medicine

## 2020-11-21 ENCOUNTER — Other Ambulatory Visit: Payer: Self-pay

## 2020-11-21 DIAGNOSIS — M25612 Stiffness of left shoulder, not elsewhere classified: Secondary | ICD-10-CM | POA: Diagnosis not present

## 2020-11-21 DIAGNOSIS — M6281 Muscle weakness (generalized): Secondary | ICD-10-CM | POA: Insufficient documentation

## 2020-11-21 DIAGNOSIS — G8929 Other chronic pain: Secondary | ICD-10-CM | POA: Diagnosis not present

## 2020-11-21 DIAGNOSIS — M25512 Pain in left shoulder: Secondary | ICD-10-CM | POA: Insufficient documentation

## 2020-11-21 NOTE — Therapy (Signed)
[facility], [place], [address] Phone: [phone]   Fax:  [phone]  Physical Therapy Evaluation  Patient Details  Name: Sheila Nelson MRN: [id number] Date of Birth: 05-Oct-1960 Referring Provider (PT): Thurman Coyer, DO   Encounter Date: 11/21/2020   PT End of Session - 11/21/20 0927    Visit Number 1    Number of Visits 9    Date for PT Re-Evaluation 12/20/20    Authorization Type [facility]    PT Start Time 0932    PT Stop Time 1012    PT Time Calculation (min) 40 min    Activity Tolerance Patient tolerated treatment well;No increased pain    Behavior During Therapy WFL for tasks assessed/performed           Past Medical History:  Diagnosis Date  . Allergy   . Arthritis    feet and ankles  . ASTHMA UNSPECIFIED WITH EXACERBATION   . Borderline diabetes   . Chest pain    a. 09/2015 felt to be non cardiac after LHC with no CAD   . Fasciculation 08/04/2017  . Gallstones   . GERD (gastroesophageal reflux disease)   . H/O varicella   . Herpes   . HTN (hypertension)   . Hx of viral meningitis   . Obesity   . Pancreatitis    Viral  . PLANTAR FASCIITIS, RIGHT 09/30/2010  . PVC's (premature ventricular contractions)   . REACTIVE AIRWAY DISEASE 10/09/2009  . TINNITUS 10/09/2009    Past Surgical History:  Procedure Laterality Date  . APPENDECTOMY  1992  . CARDIAC CATHETERIZATION N/A 09/17/2015   Procedure: Left Heart Cath and Coronary Angiography;  Surgeon: Burnell Blanks, MD;  Location: [facility];  Service: Cardiovascular;  Laterality: N/A;  . CESAREAN SECTION  06/23/2012   Procedure: CESAREAN SECTION;  Surgeon: Lovenia Kim, MD;  Location: [facility];  Service: Obstetrics;  Laterality: N/A;  . CHOLECYSTECTOMY N/A 10/05/2016   Procedure: LAPAROSCOPIC CHOLECYSTECTOMY;  Surgeon: Fanny Skates, MD;  Location: [facility];  Service: General;  Laterality: N/A;  . DILATION AND CURETTAGE OF  UTERUS  2001  . INGUINAL HERNIA REPAIR     Infant, bilateral  . MOUTH SURGERY    . Right thigh surgery  1973   Trauma    There were no vitals filed for this visit.    Subjective Assessment - 11/21/20 0934    Subjective Pt is a pleasant 60 y/o F who presents to PT with reports of roughly 6 month history of R posterior hip and ischial tuberosity pain. She notes that in November of 2021 she was hiking and felt a tug on her R posterior hip pain when stepping down with her L LE. Her pain has been moderate and intermittent since, with pain at worst when sitting and with deep flexion/squating. She is very active with fishing and hiking and is frustrated by her current inability to perform desired recreational activities comfortably. Has previous R hip and R LE trauma when younger that has not been an issue until recent R hip exaccerbation.    Limitations Sitting    How long can you sit comfortably? 19min    How long can you stand comfortably? no pain with standing    How long can you walk comfortably? only pain with walking uphill    Diagnostic tests EXAM:  DG HIP (WITH OR WITHOUT PELVIS) 2-3V RIGHT     COMPARISON:  Abdominal radiograph 03/16/2017  FINDINGS:  No acute bony abnormality. Specifically, no fracture, subluxation,  or dislocation. There is periarticular spurring, subchondral  sclerosis and cystic changes about the right acetabulum compatible  with some mild to moderate degenerative change quite similar to  comparison radiography. Additional enthesopathic changes are noted  upon the iliac crest, greater and lesser trochanters. Heterogenous  elongated mineralization adjacent the right ischial tuberosity may  reflect sequela of prior myositis ossificans.     IMPRESSION:  Mild to moderate right hip degenerative changes, similar to  comparison radiography.     Elongated mineralization adjacent the right ischial tuberosity is  suggestive of prior myositis os ossific cans, can be seen as sequela  of  prior injury or trauma.    Currently in Pain? Yes    Pain Score 3     Pain Location Hip    Pain Orientation Right    Pain Descriptors / Indicators Aching    Pain Type Chronic pain    Pain Onset More than a month ago    Pain Frequency Constant    Aggravating Factors  squat and deep hip flexion, prolonged sitting    Pain Relieving Factors DN w/ STIM, standing, walking              [facility] PT Assessment - 11/21/20 0001      Assessment   Medical Diagnosis M25.551 (ICD-10-CM) - Right hip pain    Referring Provider (PT) Thurman Coyer, DO    Hand Dominance Right    Next MD Visit 12/19/20    Prior Therapy Yes - OPPT for L shoulder at this facility      Precautions   Precautions Other (comment)   HTN     Restrictions   Weight Bearing Restrictions No      Balance Screen   Has the patient fallen in the past 6 months Yes    How many times? 1   minor fall while hiking - MOI for current pain   Has the patient had a decrease in activity level because of a fear of falling?  No    Is the patient reluctant to leave their home because of a fear of falling?  No      Home Environment   Living Environment --   Pt lives with spouse and son in home; no barriers     Prior Function   Level of Independence Independent    Vocation Full time Chartered loss adjuster for [employer]   Leisure --   enjoys hiking and fishing     Observation/Other Assessments   Focus on Therapeutic Outcomes (FOTO)  65      ROM / Strength   AROM / PROM / Strength AROM;PROM;Strength      AROM   Overall AROM  Within functional limits for tasks performed      PROM   Overall PROM  Within functional limits for tasks performed      Strength   Strength Assessment Site Hip;Knee    Right/Left Hip Right;Left    Right Hip Flexion 4/5    Right Hip Extension 4/5    Right Hip ABduction 4+/5    Right Hip ADduction 4/5    Left Hip Flexion 5/5    Left Hip Extension 5/5    Left Hip ABduction 5/5    Left Hip ADduction 5/5       Palpation   Palpation comment TTP to R ishcial tuberosity with large increase in pain reported; concordant pain  Objective measurements completed on examination: See above findings.               PT Education - 11/21/20 1019    Education Details PT education on condition, POC, and creation/review of HEP    Person(s) Educated Patient    Methods Explanation;Demonstration;Handout    Comprehension Verbalized understanding;Returned demonstration            PT Short Term Goals - 11/21/20 1032      PT SHORT TERM GOAL #1   Title Pt will be compliant and knowledgeable with HEP in order to improve carryover and functional ability    Baseline Initial HEP given    Time 2    Period Weeks    Status New    Target Date 12/05/20      PT SHORT TERM GOAL #2   Title Pt will be able to perform squat with hands to floor with no increase in R posterior hip pain in order to improve functional ability    Baseline Pain with squat - 3/10    Time 2    Period Weeks    Status New    Target Date 12/05/20             PT Long Term Goals - 11/21/20 1034      PT LONG TERM GOAL #1   Title Pt will improve FOTO score to 72 for improve in functional ability and performance of ADLs    Baseline Intake: 65    Time 4    Period Weeks    Status New    Target Date 12/20/20      PT LONG TERM GOAL #2   Title Pt to report R posterior LE pain no greater than 2/10 at worst in order to improve comfort and functional mobility    Baseline 4/10 R hip pain present    Time 4    Period Weeks    Status New    Target Date 12/20/20      PT LONG TERM GOAL #3   Title Pt will improve sitting tolerance to no less than 1 hour before increase in pain    Baseline 20 min with R hip pain    Time 4    Period Weeks    Status New    Target Date 12/20/20                  Plan - 11/21/20 1021    Clinical Impression Statement Pt is a pleasant 60 y/o F who presents  to PT with reports of roughly 6 month history of R posterior hip and ischial tuberosity pain. Physical findings are consistent with physician impression of R hamstring origin tendinopathy, with palpable pain to R ischial tuberosity and reduced strength in R LE posterior chain musculature. She would benefit from skilled PT services working on progressive loading of R proximal hip musculature and R hamstring in order to decrease pain and improve functional ability. PT will assess response to initial HEP interventions and progress as able.    Examination-Activity Limitations Squat    Stability/Clinical Decision Making Stable/Uncomplicated    Clinical Decision Making Low    Rehab Potential Excellent    PT Frequency 2x / week    PT Duration 4 weeks    PT Treatment/Interventions ADLs/Self Care Home Management;Electrical Stimulation;Moist Heat;Ultrasound;Gait training;Stair training;Therapeutic activities;Therapeutic exercise;Balance training;Neuromuscular re-education;Patient/family education;Manual techniques;Passive range of motion;Dry needling;Spinal Manipulations;Joint Manipulations    PT Next Visit Plan Assess response to initial HEP  and progress LE strengthening exercises as tolerated    PT Home Exercise Plan Access Code: [email or web address]    Consulted and Agree with Plan of Care Patient           Patient will benefit from skilled therapeutic intervention in order to improve the following deficits and impairments:  Difficulty walking,Pain,Decreased strength,Decreased activity tolerance,Decreased balance,Decreased endurance  Visit Diagnosis: Muscle weakness (generalized)     Problem List Patient Active Problem List   Diagnosis Date Noted  . Left eye pain 04/02/2020  . Essential hypertension   . Obesity    Ward Chatters, PT, DPT 11/21/20 11:09 AM  [facility] [address] [place], [place], [address] Phone: [phone]   Fax:   [phone]  Name: Sheila Nelson MRN: [id number] Date of Birth: Sep 20, 1960

## 2020-11-28 ENCOUNTER — Ambulatory Visit: Payer: 59

## 2020-11-28 ENCOUNTER — Other Ambulatory Visit: Payer: Self-pay

## 2020-11-28 DIAGNOSIS — M6281 Muscle weakness (generalized): Secondary | ICD-10-CM | POA: Diagnosis not present

## 2020-11-28 DIAGNOSIS — M25612 Stiffness of left shoulder, not elsewhere classified: Secondary | ICD-10-CM

## 2020-11-28 DIAGNOSIS — M25512 Pain in left shoulder: Secondary | ICD-10-CM | POA: Diagnosis not present

## 2020-11-28 DIAGNOSIS — G8929 Other chronic pain: Secondary | ICD-10-CM | POA: Diagnosis not present

## 2020-11-28 NOTE — Therapy (Signed)
[place], [place], [address] Phone: [phone]   Fax:  [phone]  Physical Therapy Treatment  Patient Details  Name: Sheila Nelson MRN: [id number] Date of Birth: 03-11-1961 Referring Provider (PT): Thurman Coyer, DO   Encounter Date: 11/28/2020   PT End of Session - 11/28/20 1001    Visit Number 2    Number of Visits 9    Date for PT Re-Evaluation 12/20/20    Authorization Type [employer]    PT Start Time 1540    PT Stop Time 0867    PT Time Calculation (min) 40 min    Activity Tolerance Patient tolerated treatment well;No increased pain    Behavior During Therapy WFL for tasks assessed/performed           Past Medical History:  Diagnosis Date  . Allergy   . Arthritis    feet and ankles  . ASTHMA UNSPECIFIED WITH EXACERBATION   . Borderline diabetes   . Chest pain    a. 09/2015 felt to be non cardiac after LHC with no CAD   . Fasciculation 08/04/2017  . Gallstones   . GERD (gastroesophageal reflux disease)   . H/O varicella   . Herpes   . HTN (hypertension)   . Hx of viral meningitis   . Obesity   . Pancreatitis    Viral  . PLANTAR FASCIITIS, RIGHT 09/30/2010  . PVC's (premature ventricular contractions)   . REACTIVE AIRWAY DISEASE 10/09/2009  . TINNITUS 10/09/2009    Past Surgical History:  Procedure Laterality Date  . APPENDECTOMY  1992  . CARDIAC CATHETERIZATION N/A 09/17/2015   Procedure: Left Heart Cath and Coronary Angiography;  Surgeon: Burnell Blanks, MD;  Location: [facility];  Service: Cardiovascular;  Laterality: N/A;  . CESAREAN SECTION  06/23/2012   Procedure: CESAREAN SECTION;  Surgeon: Lovenia Kim, MD;  Location: [facility];  Service: Obstetrics;  Laterality: N/A;  . CHOLECYSTECTOMY N/A 10/05/2016   Procedure: LAPAROSCOPIC CHOLECYSTECTOMY;  Surgeon: Fanny Skates, MD;  Location: [facility];  Service: General;  Laterality: N/A;  . DILATION AND CURETTAGE OF  UTERUS  2001  . INGUINAL HERNIA REPAIR     Infant, bilateral  . MOUTH SURGERY    . Right thigh surgery  1973   Trauma    There were no vitals filed for this visit.   Subjective Assessment - 11/28/20 1001    Subjective Pt presents to PT with reports of continued R posterior hip pain and discomfort. She notes that she has been compliant with her HEP with no adverse effect. She had increased pain in her R posterior hip over the weekend as she was more active with fly fishing. She is ready to begin PT treatment at this time.    Currently in Pain? Yes    Pain Score 4     Pain Location Hip    Pain Orientation Right;Posterior    Pain Type Chronic pain                   [facility] Adult PT Treatment/Exercise - 11/28/20 0001      Exercises   Exercises Knee/Hip      Knee/Hip Exercises: Stretches   Other Knee/Hip Stretches SKTC 2x30 sec ea      Knee/Hip Exercises: Aerobic   Recumbent Bike lvl 2 x 5 min while taking subjective      Knee/Hip Exercises: Standing   Other Standing Knee Exercises double leg RDL  with dow rod at back for form and sequencing 2x10      Knee/Hip Exercises: Supine   Bridges 10 reps    Single Leg Bridge 2 sets;10 reps    Other Supine Knee/Hip Exercises hamstring ball rolouts 2x10 red swiss ball      Knee/Hip Exercises: Prone   Hip Extension 2 sets;10 reps   with knee bent to 90 degrees                 PT Education - 11/28/20 1051    Education Details education on newly updated HEP exercises    Person(s) Educated Patient    Methods Explanation;Demonstration;Handout    Comprehension Verbalized understanding;Returned demonstration            PT Short Term Goals - 11/21/20 1032      PT SHORT TERM GOAL #1   Title Pt will be compliant and knowledgeable with HEP in order to improve carryover and functional ability    Baseline Initial HEP given    Time 2    Period Weeks    Status New    Target Date 12/05/20      PT SHORT TERM GOAL #2    Title Pt will be able to perform squat with hands to floor with no increase in R posterior hip pain in order to improve functional ability    Baseline Pain with squat - 3/10    Time 2    Period Weeks    Status New    Target Date 12/05/20             PT Long Term Goals - 11/21/20 1034      PT LONG TERM GOAL #1   Title Pt will improve FOTO score to 72 for improve in functional ability and performance of ADLs    Baseline Intake: 65    Time 4    Period Weeks    Status New    Target Date 12/20/20      PT LONG TERM GOAL #2   Title Pt to report R posterior LE pain no greater than 2/10 at worst in order to improve comfort and functional mobility    Baseline 4/10 R hip pain present    Time 4    Period Weeks    Status New    Target Date 12/20/20      PT LONG TERM GOAL #3   Title Pt will improve sitting tolerance to no less than 1 hour before increase in pain    Baseline 20 min with R hip pain    Time 4    Period Weeks    Status New    Target Date 12/20/20                 Plan - 11/28/20 1051    Clinical Impression Statement Pt was once again able to complete all prescribed exercises with no increase in pain or adverse effect. She continues to demo decreased R posterior hip and proximal hip strength, as L shows more motor control and less fatigue. Pt continues to benefit from skilled PT services with progression of posterior chain strengthening to perfrom loaded deadlift and eccentric exercises. PT will otherwise continue with POC as prescribed.    PT Treatment/Interventions ADLs/Self Care Home Management;Electrical Stimulation;Moist Heat;Ultrasound;Gait training;Stair training;Therapeutic activities;Therapeutic exercise;Balance training;Neuromuscular re-education;Patient/family education;Manual techniques;Passive range of motion;Dry needling;Spinal Manipulations;Joint Manipulations    PT Next Visit Plan assess response to updated HEP and progress strengthening exercises as  able  PT Home Exercise Plan Access Code: [email or web address]           Patient will benefit from skilled therapeutic intervention in order to improve the following deficits and impairments:  Difficulty walking,Pain,Decreased strength,Decreased activity tolerance,Decreased balance,Decreased endurance  Visit Diagnosis: Muscle weakness (generalized)  Chronic left shoulder pain  Stiffness of left shoulder, not elsewhere classified     Problem List Patient Active Problem List   Diagnosis Date Noted  . Left eye pain 04/02/2020  . Essential hypertension   . Obesity     Ward Chatters, PT, DPT 11/28/20 10:56 AM  [facility] [address] [place], [place], [address] Phone: [phone]   Fax:  [phone]  Name: Sheila Nelson MRN: [id number] Date of Birth: [birth year]

## 2020-12-03 ENCOUNTER — Other Ambulatory Visit (HOSPITAL_COMMUNITY): Payer: Self-pay

## 2020-12-03 MED ORDER — HYDROCHLOROTHIAZIDE 25 MG PO TABS
25.0000 mg | ORAL_TABLET | Freq: Every day | ORAL | 0 refills | Status: DC
  Filled 2020-12-03: qty 90, 90d supply, fill #0

## 2020-12-03 MED ORDER — AMLODIPINE BESYLATE 5 MG PO TABS
1.0000 | ORAL_TABLET | Freq: Every day | ORAL | 0 refills | Status: DC
  Filled 2020-12-03: qty 90, 90d supply, fill #0

## 2020-12-04 ENCOUNTER — Other Ambulatory Visit (HOSPITAL_COMMUNITY): Payer: Self-pay

## 2020-12-05 ENCOUNTER — Other Ambulatory Visit (HOSPITAL_COMMUNITY): Payer: Self-pay

## 2020-12-09 ENCOUNTER — Other Ambulatory Visit: Payer: Self-pay

## 2020-12-09 ENCOUNTER — Ambulatory Visit: Payer: 59 | Attending: Sports Medicine

## 2020-12-09 DIAGNOSIS — M6281 Muscle weakness (generalized): Secondary | ICD-10-CM | POA: Diagnosis not present

## 2020-12-09 NOTE — Therapy (Signed)
[place], [place], [address] Phone: [phone]   Fax:  [phone]  Physical Therapy Treatment  Patient Details  Name: Sheila Nelson MRN: [id number] Date of Birth: Jul 21, 1961 Referring Provider (PT): Thurman Coyer, DO   Encounter Date: 12/09/2020   PT End of Session - 12/09/20 1004    Visit Number 3    Number of Visits 9    Date for PT Re-Evaluation 12/20/20    Authorization Type [employer] [facility]    PT Start Time 0354    PT Stop Time 6568    PT Time Calculation (min) 43 min    Activity Tolerance Patient tolerated treatment well;No increased pain    Behavior During Therapy WFL for tasks assessed/performed           Past Medical History:  Diagnosis Date  . Allergy   . Arthritis    feet and ankles  . ASTHMA UNSPECIFIED WITH EXACERBATION   . Borderline diabetes   . Chest pain    a. 09/2015 felt to be non cardiac after LHC with no CAD   . Fasciculation 08/04/2017  . Gallstones   . GERD (gastroesophageal reflux disease)   . H/O varicella   . Herpes   . HTN (hypertension)   . Hx of viral meningitis   . Obesity   . Pancreatitis    Viral  . PLANTAR FASCIITIS, RIGHT 09/30/2010  . PVC's (premature ventricular contractions)   . REACTIVE AIRWAY DISEASE 10/09/2009  . TINNITUS 10/09/2009    Past Surgical History:  Procedure Laterality Date  . APPENDECTOMY  1992  . CARDIAC CATHETERIZATION N/A 09/17/2015   Procedure: Left Heart Cath and Coronary Angiography;  Surgeon: Burnell Blanks, MD;  Location: [facility];  Service: Cardiovascular;  Laterality: N/A;  . CESAREAN SECTION  06/23/2012   Procedure: CESAREAN SECTION;  Surgeon: Lovenia Kim, MD;  Location: [facility];  Service: Obstetrics;  Laterality: N/A;  . CHOLECYSTECTOMY N/A 10/05/2016   Procedure: LAPAROSCOPIC CHOLECYSTECTOMY;  Surgeon: Fanny Skates, MD;  Location: [facility];  Service: General;  Laterality: N/A;  . DILATION AND CURETTAGE OF  UTERUS  2001  . INGUINAL HERNIA REPAIR     Infant, bilateral  . MOUTH SURGERY    . Right thigh surgery  1973   Trauma    There were no vitals filed for this visit.   Subjective Assessment - 12/09/20 1005    Subjective Pt presents to PT with reports of continued R posterior hip and R ankle pain. The R ankle pain started over the weekend after fishing and walking out of the bank. Pt has been compliant with her HEP with no adverse effect. She ready to begin PT treatment at this time.    Currently in Pain? Yes    Pain Score 5     Pain Location Hip    Pain Orientation Right;Posterior;Proximal    Pain Descriptors / Indicators Aching    Multiple Pain Sites Yes    Pain Score 2    Pain Location Ankle    Pain Orientation Right    Pain Descriptors / Indicators Aching    Pain Type Acute pain                             [facility] Adult PT Treatment/Exercise - 12/09/20 0001      Knee/Hip Exercises: Aerobic   Nustep lvl 7.0 UE/LE x 5 min while taking subjective  Knee/Hip Exercises: Machines for Strengthening   Cybex Leg Press 2x8 60lbs   w/ glute squeeze at top   Hip Cybex 2x10 ext 25lbs      Knee/Hip Exercises: Standing   Other Standing Knee Exercises double leg RDL 2x10 10lbs      Knee/Hip Exercises: Supine   Bridges 10 reps    Single Leg Bridge 2 sets;10 reps;Both      Knee/Hip Exercises: Prone   Hip Extension 2 sets;10 reps   with knee bent to 90 degrees   Other Prone Exercises qped leg extension 2x10                    PT Short Term Goals - 11/21/20 1032      PT SHORT TERM GOAL #1   Title Pt will be compliant and knowledgeable with HEP in order to improve carryover and functional ability    Baseline Initial HEP given    Time 2    Period Weeks    Status New    Target Date 12/05/20      PT SHORT TERM GOAL #2   Title Pt will be able to perform squat with hands to floor with no increase in R posterior hip pain in order to improve functional  ability    Baseline Pain with squat - 3/10    Time 2    Period Weeks    Status New    Target Date 12/05/20             PT Long Term Goals - 11/21/20 1034      PT LONG TERM GOAL #1   Title Pt will improve FOTO score to 72 for improve in functional ability and performance of ADLs    Baseline Intake: 65    Time 4    Period Weeks    Status New    Target Date 12/20/20      PT LONG TERM GOAL #2   Title Pt to report R posterior LE pain no greater than 2/10 at worst in order to improve comfort and functional mobility    Baseline 4/10 R hip pain present    Time 4    Period Weeks    Status New    Target Date 12/20/20      PT LONG TERM GOAL #3   Title Pt will improve sitting tolerance to no less than 1 hour before increase in pain    Baseline 20 min with R hip pain    Time 4    Period Weeks    Status New    Target Date 12/20/20                 Plan - 12/09/20 1043    Clinical Impression Statement Pt was able to complete all prescribed exercises with no adverse effect or increase in pain. She continues to show improving strength and functional activity tolerance of R hamstring. Pt is progressing well and should continue to be seen per POC as prescribed.    PT Treatment/Interventions ADLs/Self Care Home Management;Electrical Stimulation;Moist Heat;Ultrasound;Gait training;Stair training;Therapeutic activities;Therapeutic exercise;Balance training;Neuromuscular re-education;Patient/family education;Manual techniques;Passive range of motion;Dry needling;Spinal Manipulations;Joint Manipulations    PT Next Visit Plan assess response to updated HEP and progress strengthening exercises as able    PT Home Exercise Plan Access Code: [email or web address]           Patient will benefit from skilled therapeutic intervention in order to improve the following deficits and impairments:  Difficulty walking,Pain,Decreased  strength,Decreased activity tolerance,Decreased balance,Decreased  endurance  Visit Diagnosis: Muscle weakness (generalized)     Problem List Patient Active Problem List   Diagnosis Date Noted  . Left eye pain 04/02/2020  . Essential hypertension   . Obesity     Ward Chatters, PT, DPT 12/09/20 1:01 PM  [facility] [address] [place], [place], [address] Phone: [phone]   Fax:  [phone]  Name: Sheila Nelson MRN: [id number] Date of Birth: March 30, 1961

## 2020-12-11 ENCOUNTER — Ambulatory Visit: Payer: 59

## 2020-12-11 ENCOUNTER — Other Ambulatory Visit: Payer: Self-pay

## 2020-12-11 DIAGNOSIS — M6281 Muscle weakness (generalized): Secondary | ICD-10-CM | POA: Diagnosis not present

## 2020-12-11 NOTE — Therapy (Signed)
[place], [place], [address] Phone: [phone]   Fax:  [phone]  Physical Therapy Treatment  Patient Details  Name: Sheila Nelson MRN: [id number] Date of Birth: 05/21/1961 Referring Provider (PT): Thurman Coyer, DO   Encounter Date: 12/11/2020   PT End of Session - 12/11/20 1334    Visit Number 4    Number of Visits 9    Date for PT Re-Evaluation 12/20/20    Authorization Type [employer] MC    PT Start Time 1050    PT Stop Time 3614    PT Time Calculation (min) 38 min    Activity Tolerance Patient tolerated treatment well;No increased pain    Behavior During Therapy WFL for tasks assessed/performed           Past Medical History:  Diagnosis Date  . Allergy   . Arthritis    feet and ankles  . ASTHMA UNSPECIFIED WITH EXACERBATION   . Borderline diabetes   . Chest pain    a. 09/2015 felt to be non cardiac after LHC with no CAD   . Fasciculation 08/04/2017  . Gallstones   . GERD (gastroesophageal reflux disease)   . H/O varicella   . Herpes   . HTN (hypertension)   . Hx of viral meningitis   . Obesity   . Pancreatitis    Viral  . PLANTAR FASCIITIS, RIGHT 09/30/2010  . PVC's (premature ventricular contractions)   . REACTIVE AIRWAY DISEASE 10/09/2009  . TINNITUS 10/09/2009    Past Surgical History:  Procedure Laterality Date  . APPENDECTOMY  1992  . CARDIAC CATHETERIZATION N/A 09/17/2015   Procedure: Left Heart Cath and Coronary Angiography;  Surgeon: Burnell Blanks, MD;  Location: [facility];  Service: Cardiovascular;  Laterality: N/A;  . CESAREAN SECTION  06/23/2012   Procedure: CESAREAN SECTION;  Surgeon: Lovenia Kim, MD;  Location: [facility];  Service: Obstetrics;  Laterality: N/A;  . CHOLECYSTECTOMY N/A 10/05/2016   Procedure: LAPAROSCOPIC CHOLECYSTECTOMY;  Surgeon: Fanny Skates, MD;  Location: [facility];  Service: General;  Laterality: N/A;  . DILATION AND CURETTAGE OF  UTERUS  2001  . INGUINAL HERNIA REPAIR     Infant, bilateral  . MOUTH SURGERY    . Right thigh surgery  1973   Trauma    There were no vitals filed for this visit.   Subjective Assessment - 12/11/20 1055    Subjective Pt presents to PT with continued reports of slight R posterior hip and hamstring pain. She notes that she had sharp increase in pain after last session, with muscle soreness for two days as well. Today is her last scheduled visit and she has her f/u visit with referring MD next week. She states she feels ready for potential discharge with HEP in place for continued strengthening.    Currently in Pain? Yes    Pain Score 2     Pain Location Hip    Pain Orientation Right;Posterior    Pain Descriptors / Indicators Aching                             [facility] Adult PT Treatment/Exercise - 12/11/20 0001      Knee/Hip Exercises: Stretches   Other Knee/Hip Stretches SKTC R LE x 30 sec      Knee/Hip Exercises: Aerobic   Recumbent Bike lvl 2 x 4 min while taking subjective  Knee/Hip Exercises: Standing   Hip Extension 2 sets;10 reps;Both   red tband   Other Standing Knee Exercises double leg RDL 2x10 10lbs      Knee/Hip Exercises: Seated   Hamstring Curl 2 sets;10 reps;Right   black tband     Knee/Hip Exercises: Supine   Bridges 10 reps    Single Leg Bridge 2 sets;10 reps;Both      Knee/Hip Exercises: Prone   Hip Extension 2 sets;10 reps   with knee bent to 90 degrees                 PT Education - 12/11/20 1337    Education Details new HEP and POC    Person(s) Educated Patient    Methods Explanation;Demonstration;Handout    Comprehension Verbalized understanding;Returned demonstration            PT Short Term Goals - 12/11/20 1339      PT SHORT TERM GOAL #1   Title Pt will be compliant and knowledgeable with HEP in order to improve carryover and functional ability    Baseline Initial HEP given    Time 2    Period Weeks     Status Achieved    Target Date 12/05/20      PT SHORT TERM GOAL #2   Title Pt will be able to perform squat with hands to floor with no increase in R posterior hip pain in order to improve functional ability    Baseline Pain with squat - 3/10    Time 2    Period Weeks    Status Achieved    Target Date 12/05/20             PT Long Term Goals - 12/11/20 1059      PT LONG TERM GOAL #1   Title Pt will improve FOTO score to 72 for improve in functional ability and performance of ADLs    Baseline Intake: 65; 12/11/20 - 70%    Time 4    Period Weeks    Status Partially Met      PT LONG TERM GOAL #2   Title Pt to report R posterior LE pain no greater than 2/10 at worst in order to improve comfort and functional mobility    Baseline 4/10 R hip pain at eval    Time 4    Period Weeks    Status Not Met   continues to note 4/10 pain at worst, but states she feels good about current management strategies     PT LONG TERM GOAL #3   Title Pt will improve sitting tolerance to no less than 1 hour before increase in pain    Baseline 20 min with R hip pain    Time 4    Period Weeks    Status Not Met   20-30 min; she states she wants to start testing more and get back to biking this weekend                Plan - 12/11/20 1338    Clinical Impression Statement Pt was once again able to complete prescribed exercises with progression and no adverse effect. She demonstrated knowledge of HEP with no increase in pain during therapeutic exercises. Over the course of PT treatment, she has been able to make slight improvements, such as increasing her FOTO score from 65% at eval to 70% today. She has also incorportated HEP into daily routine and notes decrease in overall R hip pain. While  she has not quite met all goals, pt feels like she is at a good stopping point at this time with HEP in place and upcoming follow up visit with referring provider. She will call if she feels the need to schedule  addtional appointments within POC window, but otherwise will self manage at home with HEP at this time.    PT Treatment/Interventions ADLs/Self Care Home Management;Electrical Stimulation;Moist Heat;Ultrasound;Gait training;Stair training;Therapeutic activities;Therapeutic exercise;Balance training;Neuromuscular re-education;Patient/family education;Manual techniques;Passive range of motion;Dry needling;Spinal Manipulations;Joint Manipulations    PT Home Exercise Plan Access Code: [email or web address]           Patient will benefit from skilled therapeutic intervention in order to improve the following deficits and impairments:  Difficulty walking,Pain,Decreased strength,Decreased activity tolerance,Decreased balance,Decreased endurance  Visit Diagnosis: Muscle weakness (generalized)     Problem List Patient Active Problem List   Diagnosis Date Noted  . Left eye pain 04/02/2020  . Essential hypertension   . Obesity     Ward Chatters, PT, DPT 12/11/20 1:46 PM  [facility] [address] [place], [place], [address] Phone: [phone]   Fax:  [phone]  Name: Sheila Nelson MRN: [id number] Date of Birth: [birth year]

## 2020-12-12 DIAGNOSIS — E559 Vitamin D deficiency, unspecified: Secondary | ICD-10-CM | POA: Diagnosis not present

## 2020-12-12 DIAGNOSIS — Z1329 Encounter for screening for other suspected endocrine disorder: Secondary | ICD-10-CM | POA: Diagnosis not present

## 2020-12-12 DIAGNOSIS — I10 Essential (primary) hypertension: Secondary | ICD-10-CM | POA: Diagnosis not present

## 2020-12-12 DIAGNOSIS — Z131 Encounter for screening for diabetes mellitus: Secondary | ICD-10-CM | POA: Diagnosis not present

## 2020-12-12 DIAGNOSIS — R7303 Prediabetes: Secondary | ICD-10-CM | POA: Diagnosis not present

## 2020-12-19 ENCOUNTER — Other Ambulatory Visit: Payer: Self-pay

## 2020-12-19 ENCOUNTER — Ambulatory Visit (INDEPENDENT_AMBULATORY_CARE_PROVIDER_SITE_OTHER): Payer: 59 | Admitting: Sports Medicine

## 2020-12-19 VITALS — BP 132/90 | Ht 64.0 in | Wt 185.0 lb

## 2020-12-19 DIAGNOSIS — M1611 Unilateral primary osteoarthritis, right hip: Secondary | ICD-10-CM | POA: Diagnosis not present

## 2020-12-19 DIAGNOSIS — M25551 Pain in right hip: Secondary | ICD-10-CM | POA: Diagnosis not present

## 2020-12-20 NOTE — Progress Notes (Signed)
   Subjective:    Patient ID: Sheila Nelson, female    DOB: [birth year], 60 y.o.   MRN: [id number]  [place] presents today for follow-up on chronic right hip pain.  Recent x-rays showed mild to moderate right hip degenerative joint disease as well as calcification adjacent to the right ischial tuberosity likely secondary to chronic myositis ossificans.  Overall, she states that her hip pain is about the same.  She has seen a physical therapist and has been instructed in a home exercise program.  She states that dry needling has been very effective.  She continues to localize most of her pain to the area of the ischial tuberosity but she does occasionally get groin pain as well.  No numbness or tingling.  No radiating pain into her calf or foot.    Review of Systems    As above Objective:   Physical Exam  Well-developed, well-nourished.  No acute distress  Right hip: Smooth painless hip range of motion with a negative logroll.  There is some reproducible groin pain with external rotation but no pain with internal rotation.  She still demonstrates a positive Trendelenburg.  Normal gait.      Assessment & Plan:   Chronic right hip pain secondary to chronic calcification at the ischial tuberosity status post remote trauma Right hip DJD  I believe the patient's symptoms are multifactorial.  She seems to be doing pretty well.  She is very active and enjoys fishing.  This is a chronic issue for her and she understands that she will likely have some level of pain going forward.  She also understands the importance of continuing with her home exercises to increase her hip strength.  If her pain worsens, especially pain in her groin, we could consider a diagnostic/therapeutic intra-articular cortisone injection.  Follow-up as needed.

## 2020-12-31 ENCOUNTER — Ambulatory Visit (INDEPENDENT_AMBULATORY_CARE_PROVIDER_SITE_OTHER): Payer: Self-pay | Admitting: Sports Medicine

## 2020-12-31 ENCOUNTER — Other Ambulatory Visit: Payer: Self-pay

## 2020-12-31 DIAGNOSIS — M25551 Pain in right hip: Secondary | ICD-10-CM

## 2021-01-01 NOTE — Progress Notes (Signed)
Patient ID: Sheila Nelson, female   DOB: [birth year], 60 y.o.   MRN: [id number]  Sheila Nelson presents today for her first shockwave therapy for chronic calcific proximal hamstring tendinopathy of her right hip.  Please see the procedure note below.  Patient will return next week for her second treatment.      Procedure: ECSWT Indications: As above  Procedure Details Consent obtained Time Out: Verified patient identification, verified procedure, site/side was marked, verified correct patient position, special equipment/implants available, medications/allergies/relevent history reviewed, required imaging and test results available.  {t  The area was cleaned with iodine and alcohol swabs.    The right proximal hamstring was targeted for Extracorporeal shockwave therapy.  Power Level: 90 Frequency: 10 Impulse/cycles: 2000 Head size: large   Patient tolerated procedure well.

## 2021-01-09 ENCOUNTER — Ambulatory Visit (INDEPENDENT_AMBULATORY_CARE_PROVIDER_SITE_OTHER): Payer: Self-pay | Admitting: Sports Medicine

## 2021-01-09 ENCOUNTER — Other Ambulatory Visit: Payer: Self-pay

## 2021-01-09 VITALS — Ht 64.0 in

## 2021-01-09 DIAGNOSIS — M25551 Pain in right hip: Secondary | ICD-10-CM

## 2021-01-09 NOTE — Progress Notes (Signed)
Patient ID: Sheila Nelson, female   DOB: Nov 13, 1960, 60 y.o.   MRN: [id number]  Sheila Nelson presents today for her second soundwave treatment for chronic calcific proximal hamstring tendinopathy.  She noticed significant improvement after her first treatment last week.  She is not sure whether this is from the treatment or possibly from a long hike she did over the weekend.  Procedure: ECSWT Indications: As above  Procedure Details Consent obtained Time Out: Verified patient identification, verified procedure, site/side was marked, verified correct patient position, special equipment/implants available, medications/allergies/relevent history reviewed, required imaging and test results available.  {t  The area was cleaned with iodine and alcohol swabs.    The right proximal hamstring was targeted for Extracorporeal shockwave therapy.  Power Level: 90 Frequency: 10 Impulse/cycles: 2000 Head size: large   Patient tolerated procedure well.  She will return next week for her third treatment.

## 2021-01-16 ENCOUNTER — Ambulatory Visit (INDEPENDENT_AMBULATORY_CARE_PROVIDER_SITE_OTHER): Payer: Self-pay | Admitting: Sports Medicine

## 2021-01-16 ENCOUNTER — Other Ambulatory Visit: Payer: Self-pay

## 2021-01-16 VITALS — Ht 64.0 in

## 2021-01-16 DIAGNOSIS — M25551 Pain in right hip: Secondary | ICD-10-CM

## 2021-01-16 NOTE — Progress Notes (Signed)
Patient ID: Sheila Nelson, female   DOB: 1961-02-09, 60 y.o.   MRN: [id number]  Shellye comes in today for her third soundwave treatment of her right chronic proximal calcific hamstring tendinopathy.  She continues to notice improvement.  She has no complaints today.   Procedure: ECSWT Indications:As above  Procedure Details Consentobtained Time Out: Verified patient identification, verified procedure, site/side was marked, verified correct patient position, special equipment/implants available, medications/allergies/relevent history reviewed, required imaging and test results available. {t The area was cleaned with iodine and alcohol swabs.   Theright proximal hamstringwas targeted for Extracorporeal shockwave therapy.  Power Level:90 Frequency:10 Impulse/cycles:2000 Head size:large   Patient toleratedprocedure well.  She will return next week for her fourth treatment.

## 2021-01-23 ENCOUNTER — Other Ambulatory Visit: Payer: Self-pay

## 2021-01-23 ENCOUNTER — Ambulatory Visit (INDEPENDENT_AMBULATORY_CARE_PROVIDER_SITE_OTHER): Payer: Self-pay | Admitting: Sports Medicine

## 2021-01-23 DIAGNOSIS — M76899 Other specified enthesopathies of unspecified lower limb, excluding foot: Secondary | ICD-10-CM

## 2021-01-23 NOTE — Progress Notes (Signed)
Patient ID: [name], female   DOB: December 13, 1960, 60 y.o.   MRN: [id number]  [name] comes in today for her third soundwave treatment of her right chronic proximal calcific hamstring tendinopathy.  She continues to notice improvement.  She has no complaints today.  Extracorporeal Shockwave Therapy to right proximal hamstrings:   Treatment number 4/12  Risks and benefits of procedure discussed, Patient opted to proceed. Written Consent obtained.  Timeout performed.   Patient was placed in Lateral decubitus position.   Power Level:  110 Frequency: 12 Impulse/cycles: 2500 Head size: Large  Patient tolerated the procedure very well with no immediate complications.  Aftercare, expectations, and return precautions were discussed.   She is instructed to wait 3-4 weeks to assess any improvement before returning for next treatment, should she feel this is offering benefit.   Patient seen and evaluated with the sports medicine fellow.  I agree with the above plan of care.  Patient return to clinic in 4 weeks if she desires further treatments.  Otherwise, follow-up as needed.

## 2021-01-29 ENCOUNTER — Other Ambulatory Visit: Payer: Self-pay | Admitting: Family Medicine

## 2021-01-29 DIAGNOSIS — Z1231 Encounter for screening mammogram for malignant neoplasm of breast: Secondary | ICD-10-CM

## 2021-01-30 ENCOUNTER — Other Ambulatory Visit (HOSPITAL_COMMUNITY): Payer: Self-pay

## 2021-02-03 ENCOUNTER — Other Ambulatory Visit (HOSPITAL_COMMUNITY): Payer: Self-pay

## 2021-02-03 ENCOUNTER — Other Ambulatory Visit: Payer: Self-pay

## 2021-02-03 ENCOUNTER — Other Ambulatory Visit (HOSPITAL_BASED_OUTPATIENT_CLINIC_OR_DEPARTMENT_OTHER): Payer: Self-pay

## 2021-02-03 ENCOUNTER — Ambulatory Visit: Payer: 59 | Attending: Internal Medicine

## 2021-02-03 DIAGNOSIS — I10 Essential (primary) hypertension: Secondary | ICD-10-CM | POA: Diagnosis not present

## 2021-02-03 DIAGNOSIS — K5792 Diverticulitis of intestine, part unspecified, without perforation or abscess without bleeding: Secondary | ICD-10-CM | POA: Diagnosis not present

## 2021-02-03 DIAGNOSIS — Z23 Encounter for immunization: Secondary | ICD-10-CM

## 2021-02-03 DIAGNOSIS — J301 Allergic rhinitis due to pollen: Secondary | ICD-10-CM | POA: Diagnosis not present

## 2021-02-03 DIAGNOSIS — Z113 Encounter for screening for infections with a predominantly sexual mode of transmission: Secondary | ICD-10-CM | POA: Diagnosis not present

## 2021-02-03 DIAGNOSIS — Z0001 Encounter for general adult medical examination with abnormal findings: Secondary | ICD-10-CM | POA: Diagnosis not present

## 2021-02-03 DIAGNOSIS — E669 Obesity, unspecified: Secondary | ICD-10-CM | POA: Diagnosis not present

## 2021-02-03 DIAGNOSIS — K219 Gastro-esophageal reflux disease without esophagitis: Secondary | ICD-10-CM | POA: Diagnosis not present

## 2021-02-03 DIAGNOSIS — I493 Ventricular premature depolarization: Secondary | ICD-10-CM | POA: Diagnosis not present

## 2021-02-03 DIAGNOSIS — Z136 Encounter for screening for cardiovascular disorders: Secondary | ICD-10-CM | POA: Diagnosis not present

## 2021-02-03 MED ORDER — PFIZER-BIONT COVID-19 VAC-TRIS 30 MCG/0.3ML IM SUSP
INTRAMUSCULAR | 0 refills | Status: DC
  Filled 2021-02-03: qty 0.3, 1d supply, fill #0

## 2021-02-03 MED ORDER — HYDROCHLOROTHIAZIDE 25 MG PO TABS
25.0000 mg | ORAL_TABLET | Freq: Every morning | ORAL | 3 refills | Status: DC
  Filled 2021-02-03 – 2021-03-05 (×2): qty 90, 90d supply, fill #0
  Filled 2021-05-20: qty 90, 90d supply, fill #1
  Filled 2021-09-02: qty 90, 90d supply, fill #2
  Filled 2021-11-30: qty 90, 90d supply, fill #3

## 2021-02-03 MED ORDER — VALACYCLOVIR HCL 1 G PO TABS
1000.0000 mg | ORAL_TABLET | Freq: Every day | ORAL | 4 refills | Status: DC
  Filled 2021-02-03: qty 90, 90d supply, fill #0
  Filled 2021-06-19: qty 90, 90d supply, fill #1

## 2021-02-03 MED ORDER — AMLODIPINE BESYLATE 5 MG PO TABS
1.0000 | ORAL_TABLET | Freq: Every day | ORAL | 3 refills | Status: DC
  Filled 2021-02-03 – 2021-03-05 (×2): qty 90, 90d supply, fill #0
  Filled 2021-05-20: qty 90, 90d supply, fill #1
  Filled 2021-09-02: qty 90, 90d supply, fill #2
  Filled 2021-11-30: qty 90, 90d supply, fill #3

## 2021-02-03 NOTE — Progress Notes (Signed)
   Covid-19 Vaccination Clinic  Name:  Sheila Nelson    MRN: [id number] DOB: 1961-05-06  02/03/2021  [name] was observed post Covid-19 immunization for 15 minutes without incident. She was provided with Vaccine Information Sheet and instruction to access the V-Safe system.   [name] was instructed to call 911 with any severe reactions post vaccine: Marland Kitchen Difficulty breathing  . Swelling of face and throat  . A fast heartbeat  . A bad rash all over body  . Dizziness and weakness   Immunizations Administered    Name Date Dose VIS Date Route   PFIZER Comrnaty(Gray TOP) Covid-19 Vaccine 02/03/2021 10:04 AM 0.3 mL 08/08/2020 Intramuscular   Manufacturer: [place]   Lot: [id number]   [place]: [phone]

## 2021-02-15 ENCOUNTER — Telehealth: Payer: 59 | Admitting: Nurse Practitioner

## 2021-02-15 ENCOUNTER — Encounter: Payer: Self-pay | Admitting: Nurse Practitioner

## 2021-02-15 DIAGNOSIS — W5501XA Bitten by cat, initial encounter: Secondary | ICD-10-CM | POA: Diagnosis not present

## 2021-02-15 DIAGNOSIS — R21 Rash and other nonspecific skin eruption: Secondary | ICD-10-CM | POA: Diagnosis not present

## 2021-02-15 MED ORDER — AMOXICILLIN-POT CLAVULANATE 875-125 MG PO TABS: 1.0000 | ORAL_TABLET | Freq: Two times a day (BID) | ORAL | 0 refills | Status: DC

## 2021-02-15 NOTE — Progress Notes (Signed)
Virtual Visit Consent   Sheila Nelson, you are scheduled for a virtual visit with Mary-Margaret Hassell Done, [place], a [facility] provider, today.     Just as with appointments in the office, your consent must be obtained to participate.  Your consent will be active for this visit and any virtual visit you may have with one of our providers in the next 365 days.     If you have a MyChart account, a copy of this consent can be sent to you electronically.  All virtual visits are billed to your insurance company just like a traditional visit in the office.    As this is a virtual visit, video technology does not allow for your provider to perform a traditional examination.  This may limit your provider's ability to fully assess your condition.  If your provider identifies any concerns that need to be evaluated in person or the need to arrange testing (such as labs, EKG, etc.), we will make arrangements to do so.     Although advances in technology are sophisticated, we cannot ensure that it will always work on either your end or our end.  If the connection with a video visit is poor, the visit may have to be switched to a telephone visit.  With either a video or telephone visit, we are not always able to ensure that we have a secure connection.     I need to obtain your verbal consent now.   Are you willing to proceed with your visit today?    Sheila Nelson has provided verbal consent on 02/15/2021 for a virtual visit (video or telephone).   Mary-Margaret Hassell Done, FNP   Date: 02/15/2021 10:14 AM   Virtual Visit via Video Note   I, Mary-Margaret Hassell Done, connected withNAME@ ([id number], July 02, 1961) on 02/15/21 at 10:15 AM EDT by a video-enabled telemedicine application and verified that I am speaking with the correct person using two identifiers.  Location: Patient: Virtual Visit Location Patient: Home Provider: Virtual Visit Location Provider: Mobile   I discussed the limitations of evaluation  and management by telemedicine and the availability of in person appointments. The patient expressed understanding and agreed to proceed.    History of Present Illness: Sheila Nelson is a 60 y.o. who identifies as a female who was assigned female at birth, and is being seen today for cat bite.  HPI: Patient was bitten by her cat last night in web space between her right thumb and index finger. This mornig it s red swollen and throbbing. She says her cat has had all or her shots and she is an in Forensic scientist.   Problems:  Patient Active Problem List   Diagnosis Date Noted   Left eye pain 04/02/2020   Essential hypertension    Obesity     Allergies: No Known Allergies Medications:  Current Outpatient Medications:    amLODipine (NORVASC) 5 MG tablet, Take 5 mg by mouth daily., Disp: , Rfl:    amLODipine (NORVASC) 5 MG tablet, TAKE 1 TABLET BY MOUTH ONCE A DAY, Disp: 90 tablet, Rfl: 3   amLODipine (NORVASC) 5 MG tablet, Take 1 tablet (5 mg total) by mouth daily., Disp: 90 tablet, Rfl: 3   B Complex Vitamins (VITAMIN B COMPLEX PO), Take by mouth daily. , Disp: , Rfl:    Cholecalciferol (VITAMIN D) 125 MCG (5000 UT) CAPS, Take by mouth., Disp: , Rfl:    COVID-19 mRNA Vac-TriS, Pfizer, (PFIZER-BIONT COVID-19 VAC-TRIS) SUSP injection, Inject into the muscle.,  Disp: 0.3 mL, Rfl: 0   cyclobenzaprine (FLEXERIL) 10 MG tablet, TAKE 1/2 TO 1 TABLET BY MOUTH AT BEDTIME AS NEEDED FOR MUSCLE SPASMS., Disp: 40 tablet, Rfl: 0   estradiol (ESTRACE) 0.1 MG/GM vaginal cream, INSERT 2 GM VAGINALLY DAILY FOR 2 WEEKS, THEN 1 GM VAGINALLY DAILY FOR 1 WEEK THEN 1 GM EVERY OTHER DAY FOR 1 WEEK THEN 1 GM VAGINALLY TWICE, Disp: , Rfl:    hydrochlorothiazide (HYDRODIURIL) 25 MG tablet, Take 1 tablet (25 mg total) by mouth daily., Disp: 90 tablet, Rfl: 3   hydrochlorothiazide (HYDRODIURIL) 25 MG tablet, Take 1 tablet (25 mg total) by mouth in the morning., Disp: 90 tablet, Rfl: 3   IBUPROFEN PO, Take 600 mg by mouth as  needed. , Disp: , Rfl:    MULTIPLE VITAMIN PO, Take 1 tablet by mouth daily., Disp: , Rfl:    nitrofurantoin, macrocrystal-monohydrate, (MACROBID) 100 MG capsule, TAKE 1 CAPSULE BY MOUTH TWO TIMES DAILY WITH FOOD FOR 5 DAYS, Disp: 10 capsule, Rfl: 0   valACYclovir (VALTREX) 1000 MG tablet, Take 1 tablet (1,000 mg total) by mouth daily., Disp: 30 tablet, Rfl: 11   valACYclovir (VALTREX) 1000 MG tablet, Take 1 tablet (1,000 mg total) by mouth daily., Disp: 90 tablet, Rfl: 4  Observations/Objective: Patient is well-developed, well-nourished in no acute distress.  Resting comfortably  at home.  Head is normocephalic, atraumatic.  No labored breathing.  Speech is clear and coherent with logical content.  Patient is alert and oriented at baseline.  Punture wound noted in webb space between right thumb and index finger- surrounding erythema and edema.  Assessment and Plan:  Sheila Nelson in today with chief complaint of No chief complaint on file.   1. Cat bite, initial encounter Soak in epsom salt daily Take all antibiotics Meds ordered this encounter  Medications   amoxicillin-clavulanate (AUGMENTIN) 875-125 MG tablet    Sig: Take 1 tablet by mouth 2 (two) times daily.    Dispense:  20 tablet    Refill:  0    Order Specific Question:   Supervising Provider    Answer:   Noemi Chapel [[id number]]         Follow Up Instructions: I discussed the assessment and treatment plan with the patient. The patient was provided an opportunity to ask questions and all were answered. The patient agreed with the plan and demonstrated an understanding of the instructions.  A copy of instructions were sent to the patient via MyChart.  The patient was advised to call back or seek an in-person evaluation if the symptoms worsen or if the condition fails to improve as anticipated.  Time:  I spent 10 minutes with the patient via telehealth technology discussing the above problems/concerns.     Mary-Margaret Hassell Done, FNP

## 2021-02-15 NOTE — Patient Instructions (Signed)
Animal Bite, Adult Animal bites range from mild to serious. An animal bite can result in any of these injuries: A scratch. A deep, open cut. A puncture of the skin. A crush injury. Tearing away of the skin or a body part. A bone injury. A small bite from a house pet is usually less serious than a bite from a stray or wild animal, such as a raccoon, fox, skunk, or bat. That is because stray and wild animals have a higher risk of carrying a serious infection calledrabies, which can be passed to humans through a bite. What increases the risk? You are more likely to be bitten by an animal if: You are around unfamiliar pets. You disturb an animal when it is eating, sleeping, or caring for its babies. You are outdoors in a place where small, wild animals roam freely. What are the signs or symptoms? Common symptoms of an animal bite include: Pain. Bleeding. Swelling. Bruising. How is this diagnosed? This condition may be diagnosed based on a physical exam and medical history. Your health care provider will examine your wound and ask for details about the animal and how the bite happened. You may also have tests, such as: Blood tests to check for infection. X-rays to check for damage to bones or joints. Taking a fluid sample from your wound and checking it for infection (culture test). How is this treated? Treatment varies depending on the type of animal, where the bite is on your body, and your medical history. Treatment may include: Caring for the wound. This often includes cleaning the wound, rinsing out (flushing) the wound with saline solution, and applying a bandage (dressing). In some cases, the wound may be closed with stitches (sutures), staples, skin glue, or adhesive strips. Antibiotic medicine to prevent or treat infection. This medicine may be prescribed in pill or ointment form. If the bite area becomes infected, the medicine may be given through an IV. A tetanus shot to prevent  tetanus infection. Rabies treatment to prevent rabies infection. This will be done if the animal could have rabies. Surgery. This may be done if a bite gets infected or if there is damage that needs to be repaired. Follow these instructions at home: Wound care  Follow instructions from your health care provider about how to take care of your wound. Make sure you: Wash your hands with soap and water before you change your dressing. If soap and water are not available, use hand sanitizer. Change your dressing as told by your health care provider. Leave sutures, skin glue, or adhesive strips in place. These skin closures may need to stay in place for 2 weeks or longer. If adhesive strip edges start to loosen and curl up, you may trim the loose edges. Do not remove adhesive strips completely unless your health care provider tells you to do that. Check your wound every day for signs of infection. Check for: More redness, swelling, or pain. More fluid or blood. Warmth. Pus or a bad smell.  Medicines Take or apply over-the-counter and prescription medicines only as told by your health care provider. If you were prescribed an antibiotic, take or apply it as told by your health care provider. Do not stop using the antibiotic even if your condition improves. General instructions  Keep the injured area raised (elevated) above the level of your heart while you are sitting or lying down, if this is possible. If directed, put ice on the injured area. Put ice in a plastic  bag. Place a towel between your skin and the bag. Leave the ice on for 20 minutes, 2-3 times per day. Keep all follow-up visits as told by your health care provider. This is important.  Contact a health care provider if: You have more redness, swelling, or pain around your wound. Your wound feels warm to the touch. You have a fever or chills. You have a general feeling of sickness (malaise). You feel nauseous or you vomit. You  have pain that does not get better. Get help right away if: You have a red streak that leads away from your wound. You have non-clear fluid or more blood coming from your wound. There is pus or a bad smell coming from your wound. You have trouble moving your injured area. You have numbness or tingling that extends beyond the wound. Summary Animal bites can range from mild to serious. An animal bite can cause a scratch on the skin, a deep open cut, a puncture of the skin, a crush injury, tearing away of the skin or a body part, or a bone injury. Your health care provider will examine your wound and ask for details about the animal and how the bite happened. You may also have tests such as a blood test, X-ray, or testing of a fluid sample from your wound (culture test). Treatment may include wound care, antibiotic medicine, a tetanus shot, and rabies treatment if the animal could have rabies. This information is not intended to replace advice given to you by your health care provider. Make sure you discuss any questions you have with your healthcare provider. Document Revised: 06/11/2020 Document Reviewed: 06/11/2020 Elsevier Patient Education  2022 [employer].

## 2021-02-18 ENCOUNTER — Other Ambulatory Visit: Payer: Self-pay | Admitting: Sports Medicine

## 2021-03-05 ENCOUNTER — Other Ambulatory Visit (HOSPITAL_COMMUNITY): Payer: Self-pay

## 2021-03-05 MED ORDER — CARESTART COVID-19 HOME TEST VI KIT
PACK | 0 refills | Status: DC
  Filled 2021-03-05: qty 4, 4d supply, fill #0

## 2021-03-15 ENCOUNTER — Other Ambulatory Visit: Payer: Self-pay

## 2021-03-15 ENCOUNTER — Emergency Department (HOSPITAL_COMMUNITY)
Admission: EM | Admit: 2021-03-15 | Discharge: 2021-03-15 | Disposition: A | Discharge status: Home / Self Care | Payer: 59 | Attending: Emergency Medicine | Admitting: Emergency Medicine

## 2021-03-15 ENCOUNTER — Emergency Department (HOSPITAL_COMMUNITY): Payer: 59

## 2021-03-15 ENCOUNTER — Encounter (HOSPITAL_COMMUNITY): Payer: Self-pay | Admitting: Emergency Medicine

## 2021-03-15 DIAGNOSIS — S0990XA Unspecified injury of head, initial encounter: Secondary | ICD-10-CM | POA: Diagnosis not present

## 2021-03-15 DIAGNOSIS — J45909 Unspecified asthma, uncomplicated: Secondary | ICD-10-CM | POA: Insufficient documentation

## 2021-03-15 DIAGNOSIS — W01198A Fall on same level from slipping, tripping and stumbling with subsequent striking against other object, initial encounter: Secondary | ICD-10-CM | POA: Diagnosis not present

## 2021-03-15 DIAGNOSIS — R42 Dizziness and giddiness: Secondary | ICD-10-CM | POA: Diagnosis not present

## 2021-03-15 DIAGNOSIS — Z79899 Other long term (current) drug therapy: Secondary | ICD-10-CM | POA: Diagnosis not present

## 2021-03-15 DIAGNOSIS — Y92002 Bathroom of unspecified non-institutional (private) residence single-family (private) house as the place of occurrence of the external cause: Secondary | ICD-10-CM | POA: Insufficient documentation

## 2021-03-15 DIAGNOSIS — I10 Essential (primary) hypertension: Secondary | ICD-10-CM | POA: Diagnosis not present

## 2021-03-15 DIAGNOSIS — R110 Nausea: Secondary | ICD-10-CM | POA: Diagnosis not present

## 2021-03-15 DIAGNOSIS — R519 Headache, unspecified: Secondary | ICD-10-CM | POA: Diagnosis not present

## 2021-03-15 DIAGNOSIS — H538 Other visual disturbances: Secondary | ICD-10-CM | POA: Diagnosis not present

## 2021-03-15 DIAGNOSIS — S199XXA Unspecified injury of neck, initial encounter: Secondary | ICD-10-CM | POA: Diagnosis not present

## 2021-03-15 LAB — CBC WITH DIFFERENTIAL/PLATELET
Abs Immature Granulocytes: 0.02 10*3/uL (ref 0.00–0.07)
Basophils Absolute: 0 10*3/uL (ref 0.0–0.1)
Basophils Relative: 1 %
Eosinophils Absolute: 0.2 10*3/uL (ref 0.0–0.5)
Eosinophils Relative: 3 %
HCT: 40.7 % (ref 36.0–46.0)
Hemoglobin: 14 g/dL (ref 12.0–15.0)
Immature Granulocytes: 0 %
Lymphocytes Relative: 40 %
Lymphs Abs: 2.3 10*3/uL (ref 0.7–4.0)
MCH: 31.7 pg (ref 26.0–34.0)
MCHC: 34.4 g/dL (ref 30.0–36.0)
MCV: 92.3 fL (ref 80.0–100.0)
Monocytes Absolute: 0.5 10*3/uL (ref 0.1–1.0)
Monocytes Relative: 8 %
Neutro Abs: 2.9 10*3/uL (ref 1.7–7.7)
Neutrophils Relative %: 48 %
Platelets: 262 10*3/uL (ref 150–400)
RBC: 4.41 MIL/uL (ref 3.87–5.11)
RDW: 11.9 % (ref 11.5–15.5)
WBC: 5.9 10*3/uL (ref 4.0–10.5)
nRBC: 0 % (ref 0.0–0.2)

## 2021-03-15 LAB — COMPREHENSIVE METABOLIC PANEL
ALT: 24 U/L (ref 0–44)
AST: 24 U/L (ref 15–41)
Albumin: 4.2 g/dL (ref 3.5–5.0)
Alkaline Phosphatase: 80 U/L (ref 38–126)
Anion gap: 7 (ref 5–15)
BUN: 16 mg/dL (ref 6–20)
CO2: 25 mmol/L (ref 22–32)
Calcium: 9.3 mg/dL (ref 8.9–10.3)
Chloride: 105 mmol/L (ref 98–111)
Creatinine, Ser: 0.82 mg/dL (ref 0.44–1.00)
GFR, Estimated: >60 mL/min (ref >60)
Glucose, Bld: 114 mg/dL — ABNORMAL HIGH (ref 70–99)
Potassium: 3.8 mmol/L (ref 3.5–5.1)
Sodium: 137 mmol/L (ref 135–145)
Total Bilirubin: 0.9 mg/dL (ref 0.3–1.2)
Total Protein: 6.9 g/dL (ref 6.5–8.1)

## 2021-03-15 MED ORDER — ONDANSETRON 4 MG PO TBDP: 4.0000 mg | ORAL_TABLET | Freq: Three times a day (TID) | ORAL | 0 refills | Status: DC | PRN

## 2021-03-15 NOTE — ED Notes (Signed)
Pt transported to CT ?

## 2021-03-15 NOTE — ED Triage Notes (Signed)
Patient slipped and fell in the bathroom at home last night , hit her head against the tub , denies LOC , woke up at 3am this morning with headache , nausea , dizzy/lightheaded and blurred vision . She is not taking anticoagulant .
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
## 2021-03-15 NOTE — Discharge Instructions (Addendum)
Home to rest. Motrin and Tylenol as needed as directed for headache. Zofran as needed as prescribed for nausea. Recheck with your doctor Monday, return to the ER for new or worsening symptoms.  Work note provided if needed.

## 2021-03-26 ENCOUNTER — Other Ambulatory Visit: Payer: Self-pay

## 2021-03-26 ENCOUNTER — Ambulatory Visit
Admission: RE | Admit: 2021-03-26 | Discharge: 2021-03-26 | Disposition: A | Discharge status: Home / Self Care | Payer: 59 | Source: Ambulatory Visit | Attending: Family Medicine | Admitting: Family Medicine

## 2021-03-26 DIAGNOSIS — Z1231 Encounter for screening mammogram for malignant neoplasm of breast: Secondary | ICD-10-CM

## 2021-04-02 ENCOUNTER — Other Ambulatory Visit (HOSPITAL_COMMUNITY): Payer: Self-pay

## 2021-04-02 MED ORDER — CARESTART COVID-19 HOME TEST VI KIT
PACK | 0 refills | Status: DC
  Filled 2021-04-02: qty 4, 4d supply, fill #0

## 2021-05-08 ENCOUNTER — Other Ambulatory Visit (HOSPITAL_COMMUNITY): Payer: Self-pay

## 2021-05-20 ENCOUNTER — Other Ambulatory Visit (HOSPITAL_COMMUNITY): Payer: Self-pay

## 2021-05-22 ENCOUNTER — Other Ambulatory Visit (HOSPITAL_COMMUNITY): Payer: Self-pay

## 2021-06-10 ENCOUNTER — Ambulatory Visit (INDEPENDENT_AMBULATORY_CARE_PROVIDER_SITE_OTHER): Payer: 59 | Admitting: Sports Medicine

## 2021-06-10 ENCOUNTER — Ambulatory Visit
Admission: RE | Admit: 2021-06-10 | Discharge: 2021-06-10 | Disposition: A | Discharge status: Home / Self Care | Payer: 59 | Source: Ambulatory Visit | Attending: Sports Medicine | Admitting: Sports Medicine

## 2021-06-10 VITALS — Ht 64.0 in | Wt 193.0 lb

## 2021-06-10 DIAGNOSIS — M79671 Pain in right foot: Secondary | ICD-10-CM

## 2021-06-10 DIAGNOSIS — M7731 Calcaneal spur, right foot: Secondary | ICD-10-CM | POA: Diagnosis not present

## 2021-06-10 NOTE — Progress Notes (Signed)
PCP: Shawnee Knapp, MD  Subjective:   HPI: Patient is a 60 y.o. female here for right heel pain. Patient was on a [place] trip in the [place] on Friday (06/06/2021) . She fell while stepping onto a boulder and landed on her back. Immediately began to have pain in the back of her right heel. She is able bear weight and ambulate. She has not noticed bruising or swelling. She has taken ibuprofen and iced the area which improved pain slightly.  Ht 5\' 4"  (1.626 m)   Wt 193 lb (87.5 kg)   LMP 09/19/2011   BMI 33.13 kg/m   [facility] 06/10/2021  Frequency of aerobic exercise (# of days/week) 5  Average time in minutes 45  Frequency of strengthening activities (# of days/week) 0    Objective:  Physical Exam:  Gen: NAD, comfortable in exam room right ankle No gross deformity, mild swelling on medial side of achilles, no ecchymoses FROM TTP at base of calcaneus  negative ant drawer and negative talar tilt.   Negative syndesmotic compression. Plantar flexion reproduced with [name]  NV intact distally   Assessment & Plan:  1. Pain of right heel Examined under ultrasound with Dr.Draper, appears to have small avulsion of calcaneus. No signs of fractured ankle on exam. Will send patient for xray of her right calcaneus.  - Heel lift  - Ice for up to 15 minutes at a time for pain.  - DG Os Calcis Right  Patient seen and evaluated with the resident.  I agree with the above plan of care.  Patient's point of maximum tenderness is at the calcaneal insertion of the Achilles tendon.  There is no palpable defect.  No significant soft tissue swelling or ecchymosis.  She has good strength and good plantar flexion with [name] testing.  Point-of-care ultrasound today shows her Achilles tendon to be intact but there are hypoechoic changes as well as a small avulsion fracture at the posterior calcaneus.  This was confirmed on x-ray as well.  This is a tiny avulsion that should  heal well over time. In lieu of immobilization, we will use a 5/16 inch heel lift and Sheila Nelson will follow up with me again next week for clinical reevaluation.  She has a trip planned in approximately 1 month and she would like to do some hiking.  If she does not notice improvement at next week's follow-up visit then I may need to reconsider a brief period of immobilization in a cam walker.  This note was dictated using Dragon naturally speaking software and may contain errors in syntax, spelling, or content which have not been identified prior to signing this note.

## 2021-06-16 ENCOUNTER — Ambulatory Visit: Payer: 59

## 2021-06-17 ENCOUNTER — Other Ambulatory Visit (HOSPITAL_COMMUNITY): Payer: Self-pay

## 2021-06-17 ENCOUNTER — Ambulatory Visit (INDEPENDENT_AMBULATORY_CARE_PROVIDER_SITE_OTHER): Payer: 59 | Admitting: Sports Medicine

## 2021-06-17 VITALS — BP 126/88 | Ht 64.0 in | Wt 193.0 lb

## 2021-06-17 DIAGNOSIS — M25551 Pain in right hip: Secondary | ICD-10-CM

## 2021-06-17 DIAGNOSIS — M79671 Pain in right foot: Secondary | ICD-10-CM | POA: Diagnosis not present

## 2021-06-17 MED ORDER — PREDNISONE 10 MG PO TABS
ORAL_TABLET | ORAL | 0 refills | Status: DC
  Filled 2021-06-17: qty 21, 6d supply, fill #0

## 2021-06-18 NOTE — Progress Notes (Signed)
   Subjective:    Patient ID: Sheila Nelson, female    DOB: May 04, 1961, 60 y.o.   MRN: [id number]  HPI   Marlenne presents today for follow-up.  Unfortunately her entire right side is sore likely secondary to limping from her right heel injury.  She still describing pain at the posterior aspect of her calcaneus where x-ray and ultrasound show a tiny avulsion fracture.  She has been wearing her heel lifts with good symptom relief but today she began to have pain in the entire leg including pain in the right hip which is chronic due to known calcific tendinopathy.    Review of Systems As above    Objective:   Physical Exam  Developed, well nourished.  Right heel: There is still some tenderness to palpation at the calcaneal insertion of the Achilles at the area of her small avulsion fracture.  Achilles tendon remains intact.  Good pulses.      Assessment & Plan:   Tiny avulsion fracture, right posterior calcaneus Calcific tendinopathy of the right hip  Patient will continue with her heel lift.  I do not think immobilization is necessary and I am concerned that it may actually make her diffuse right leg pain worse.  I am going to put her on a 6-day Sterapred Dosepak to try to calm everything down in her right leg.  She will follow-up with me again in 1 week.  She has had good success with soundwave treatment to her hip in the past so we will plan on another treatment next week.  Call with questions or concerns in the interim.  This note was dictated using Dragon naturally speaking software and may contain errors in syntax, spelling, or content which have not been identified prior to signing this note.

## 2021-06-19 ENCOUNTER — Ambulatory Visit (INDEPENDENT_AMBULATORY_CARE_PROVIDER_SITE_OTHER): Payer: Self-pay | Admitting: Sports Medicine

## 2021-06-19 ENCOUNTER — Other Ambulatory Visit (HOSPITAL_COMMUNITY): Payer: Self-pay

## 2021-06-19 DIAGNOSIS — M25551 Pain in right hip: Secondary | ICD-10-CM

## 2021-06-19 NOTE — Progress Notes (Signed)
Patient ID: Sheila Nelson, female   DOB: 05/25/1961, 60 y.o.   MRN: [id number]  Sheila Nelson presents today for another soundwave treatment of her chronic right proximal calcific hamstring tendinopathy.  She did well with the initial 4 treatments.  Her pain has begun to return although it is beginning to feel better on a steroid Dosepak.  Nonetheless, she would still like to proceed with treatment today.  She would also like to return next week for another treatment in anticipation of her upcoming hiking trip.  Treatment performed as below.  Follow-up next week and call with questions or concerns in the interim.  Procedure: ECSWT Indications: Chronic proximal hamstring calcific tendinopathy, right hip   Procedure Details Consent: Risks of procedure as well as the alternatives and risks of each were explained to the patient.  Written consent for procedure obtained. Time Out: Verified patient identification, verified procedure, site was marked, verified correct patient position, medications/allergies/relevent history reviewed.  The area was cleaned with alcohol swab.     The lateral and posterior hip was targeted for Extracorporeal shockwave therapy.    Preset: None Power Level: 110 Frequency: 12 Impulse/cycles: 2500 Head size: Large   Patient tolerated procedure well without immediate complications   This note was dictated using Dragon naturally speaking software and may contain errors in syntax, spelling, or content which have not been identified prior to signing this note.

## 2021-06-25 ENCOUNTER — Other Ambulatory Visit: Payer: Self-pay

## 2021-06-25 ENCOUNTER — Emergency Department (HOSPITAL_COMMUNITY): Payer: 59

## 2021-06-25 ENCOUNTER — Emergency Department (HOSPITAL_COMMUNITY)
Admission: EM | Admit: 2021-06-25 | Discharge: 2021-06-26 | Disposition: A | Discharge status: Home / Self Care | Payer: 59 | Attending: Emergency Medicine | Admitting: Emergency Medicine

## 2021-06-25 DIAGNOSIS — I10 Essential (primary) hypertension: Secondary | ICD-10-CM | POA: Diagnosis not present

## 2021-06-25 DIAGNOSIS — R0902 Hypoxemia: Secondary | ICD-10-CM | POA: Diagnosis not present

## 2021-06-25 DIAGNOSIS — Z5321 Procedure and treatment not carried out due to patient leaving prior to being seen by health care provider: Secondary | ICD-10-CM | POA: Diagnosis not present

## 2021-06-25 DIAGNOSIS — R110 Nausea: Secondary | ICD-10-CM | POA: Insufficient documentation

## 2021-06-25 DIAGNOSIS — R0789 Other chest pain: Secondary | ICD-10-CM | POA: Diagnosis not present

## 2021-06-25 DIAGNOSIS — R000 Tachycardia, unspecified: Secondary | ICD-10-CM | POA: Diagnosis not present

## 2021-06-25 DIAGNOSIS — R002 Palpitations: Secondary | ICD-10-CM | POA: Insufficient documentation

## 2021-06-25 DIAGNOSIS — R079 Chest pain, unspecified: Secondary | ICD-10-CM | POA: Diagnosis not present

## 2021-06-25 DIAGNOSIS — I788 Other diseases of capillaries: Secondary | ICD-10-CM | POA: Diagnosis not present

## 2021-06-25 LAB — BASIC METABOLIC PANEL
Anion gap: 13 (ref 5–15)
BUN: 16 mg/dL (ref 6–20)
CO2: 23 mmol/L (ref 22–32)
Calcium: 9.3 mg/dL (ref 8.9–10.3)
Chloride: 99 mmol/L (ref 98–111)
Creatinine, Ser: 0.87 mg/dL (ref 0.44–1.00)
GFR, Estimated: >60 mL/min (ref >60)
Glucose, Bld: 179 mg/dL — ABNORMAL HIGH (ref 70–99)
Potassium: 3.3 mmol/L — ABNORMAL LOW (ref 3.5–5.1)
Sodium: 135 mmol/L (ref 135–145)

## 2021-06-25 LAB — CBC
HCT: 40.1 % (ref 36.0–46.0)
Hemoglobin: 13.9 g/dL (ref 12.0–15.0)
MCH: 32 pg (ref 26.0–34.0)
MCHC: 34.7 g/dL (ref 30.0–36.0)
MCV: 92.2 fL (ref 80.0–100.0)
Platelets: 290 10*3/uL (ref 150–400)
RBC: 4.35 MIL/uL (ref 3.87–5.11)
RDW: 11.9 % (ref 11.5–15.5)
WBC: 7.8 10*3/uL (ref 4.0–10.5)
nRBC: 0 % (ref 0.0–0.2)

## 2021-06-25 LAB — TROPONIN I (HIGH SENSITIVITY): Troponin I (High Sensitivity): 8 ng/L (ref <18)

## 2021-06-25 MED ORDER — FENTANYL CITRATE PF 50 MCG/ML IJ SOSY
50.0000 ug | PREFILLED_SYRINGE | Freq: Once | INTRAMUSCULAR | Status: DC
  Filled 2021-06-25: qty 1

## 2021-06-25 NOTE — ED Triage Notes (Signed)
PT BIB [facility] from home c/o CP described as pressure onset last night along with nausea, palpitations. EKG ST. Pt took 324 SA at home. EMS gave Nitox1, pt states pian worsen. Denies cardiac history. 22G L hand.

## 2021-06-25 NOTE — ED Provider Notes (Signed)
MSE was initiated and I personally evaluated the patient and placed orders (if any) at  10:38 PM on June 25, 2021.  Here with chest pain. She had one episode last night that resolved on its own, then returned tonight and has remained constant. Substernal, radiates to right neck, no SOB, nausea only after NTG given by EMS. NTG "made it worse". Cath in 2016 = noncardiac CP, no evidence CAD, normal LV systolic function.  Today's Vitals   06/25/21 2226 06/25/21 2227  BP: 110/72   Pulse: 100   Resp: (!) 23   Temp: 97.7 F (36.5 C)   SpO2: 100%   Weight:  86.2 kg  Height:  5\' 4"  (1.626 m)  PainSc:  4    Body mass index is 32.61 kg/m.  Nondiaphoretic RRR Lungs clear There is some reproducible quality to the pain.   The patient appears stable so that the remainder of the MSE may be completed by another provider.   Charlann Lange, PA-C 06/25/21 2242    Drenda Freeze, MD 06/25/21 [phone]

## 2021-06-26 ENCOUNTER — Ambulatory Visit (INDEPENDENT_AMBULATORY_CARE_PROVIDER_SITE_OTHER): Payer: Self-pay | Admitting: Sports Medicine

## 2021-06-26 VITALS — Ht 64.0 in

## 2021-06-26 DIAGNOSIS — M25551 Pain in right hip: Secondary | ICD-10-CM

## 2021-06-26 NOTE — Progress Notes (Addendum)
Patient ID: Sheila Nelson, female   DOB: [birth year], 60 y.o.   MRN: [id number]   Sheila Nelson presents today for another soundwave treatment of her chronic right proximal calcific hamstring tendinopathy.  She did well with the previous 5 treatments.   She will return next week for another treatment in anticipation of her upcoming hiking trip.  Treatment performed as below.  While treatment last week focused in a more generalized area of the lateral and posterior hip, today's treatment was centered more over the area of her calcific proximal hamstring tendinopathy.  Follow-up next week and call with questions or concerns in the interim.   Procedure: ECSWT Indications: Chronic proximal hamstring calcific tendinopathy, right hip   Procedure Details Consent: Risks of procedure as well as the alternatives and risks of each were explained to the patient.  Written consent for procedure obtained. Time Out: Verified patient identification, verified procedure, site was marked, verified correct patient position, medications/allergies/relevent history reviewed.  The area was cleaned with alcohol swab.     The lateral and posterior hip was targeted for Extracorporeal shockwave therapy.    Preset: None Power Level: 110 Frequency: 12 Impulse/cycles: 2500 Head size: Large  Patient tolerated the procedure well without any immediate difficulties.

## 2021-06-26 NOTE — ED Notes (Signed)
Called multiple times for vitals, no answer

## 2021-06-30 ENCOUNTER — Other Ambulatory Visit (HOSPITAL_COMMUNITY): Payer: Self-pay

## 2021-06-30 ENCOUNTER — Telehealth: Payer: 59 | Admitting: Family Medicine

## 2021-06-30 DIAGNOSIS — R300 Dysuria: Secondary | ICD-10-CM | POA: Diagnosis not present

## 2021-06-30 MED ORDER — NITROFURANTOIN MONOHYD MACRO 100 MG PO CAPS
100.0000 mg | ORAL_CAPSULE | Freq: Two times a day (BID) | ORAL | 0 refills | Status: AC
  Filled 2021-06-30: qty 10, 5d supply, fill #0

## 2021-06-30 NOTE — Progress Notes (Signed)
Virtual Visit Consent   Sheila Nelson, you are scheduled for a virtual visit with a [facility] provider today.     Just as with appointments in the office, your consent must be obtained to participate.  Your consent will be active for this visit and any virtual visit you may have with one of our providers in the next 365 days.     If you have a MyChart account, a copy of this consent can be sent to you electronically.  All virtual visits are billed to your insurance company just like a traditional visit in the office.    As this is a virtual visit, video technology does not allow for your provider to perform a traditional examination.  This may limit your provider's ability to fully assess your condition.  If your provider identifies any concerns that need to be evaluated in person or the need to arrange testing (such as labs, EKG, etc.), we will make arrangements to do so.     Although advances in technology are sophisticated, we cannot ensure that it will always work on either your end or our end.  If the connection with a video visit is poor, the visit may have to be switched to a telephone visit.  With either a video or telephone visit, we are not always able to ensure that we have a secure connection.     I need to obtain your verbal consent now.   Are you willing to proceed with your visit today?    Mirielle Byrum has provided verbal consent on 06/30/2021 for a virtual visit (video or telephone).   Perlie Mayo, NP   Date: 06/30/2021 9:18 AM   Virtual Visit via Video Note   I, Perlie Mayo, connected with  Sheila Nelson  ([id number], October 22, 1960) on 06/30/21 at  9:15 AM EDT by a video-enabled telemedicine application and verified that I am speaking with the correct person using two identifiers.  Location: Patient: Virtual Visit Location Patient: Home Provider: Virtual Visit Location Provider: Home Office   I discussed the limitations of evaluation and management by  telemedicine and the availability of in person appointments. The patient expressed understanding and agreed to proceed.    History of Present Illness: Sheila Nelson is a 60 y.o. who identifies as a female who was assigned female at birth, and is being seen today for dysuria and odor smell with urine. Blood in urine, post intercourse onset. Reports having- menopause - needs refill of estrogen cream. Denies fevers, chills, n/v, no flank, back, or pelvic pressure pain.   Problems:  Patient Active Problem List   Diagnosis Date Noted   Left eye pain 04/02/2020   Essential hypertension    Obesity     Allergies: No Known Allergies Medications:  Current Outpatient Medications:    amLODipine (NORVASC) 5 MG tablet, Take 5 mg by mouth daily., Disp: , Rfl:    amLODipine (NORVASC) 5 MG tablet, TAKE 1 TABLET BY MOUTH ONCE A DAY, Disp: 90 tablet, Rfl: 3   amLODipine (NORVASC) 5 MG tablet, Take 1 tablet (5 mg total) by mouth daily., Disp: 90 tablet, Rfl: 3   amoxicillin-clavulanate (AUGMENTIN) 875-125 MG tablet, Take 1 tablet by mouth 2 (two) times daily., Disp: 20 tablet, Rfl: 0   B Complex Vitamins (VITAMIN B COMPLEX PO), Take by mouth daily. , Disp: , Rfl:    Cholecalciferol (VITAMIN D) 125 MCG (5000 UT) CAPS, Take by mouth., Disp: , Rfl:    [id number] At Home  Antigen Test (CARESTART COVID-19 HOME TEST) KIT, USE AS DIRECTED WITHIN PACKAGE INSTRUCTIONS., Disp: 4 each, Rfl: 0   COVID-19 mRNA Vac-TriS, Pfizer, (PFIZER-BIONT COVID-19 VAC-TRIS) SUSP injection, Inject into the muscle., Disp: 0.3 mL, Rfl: 0   cyclobenzaprine (FLEXERIL) 10 MG tablet, TAKE 1/2 TO 1 TABLET BY MOUTH AT BEDTIME AS NEEDED FOR MUSCLE SPASMS., Disp: 40 tablet, Rfl: 0   estradiol (ESTRACE) 0.1 MG/GM vaginal cream, INSERT 2 GM VAGINALLY DAILY FOR 2 WEEKS, THEN 1 GM VAGINALLY DAILY FOR 1 WEEK THEN 1 GM EVERY OTHER DAY FOR 1 WEEK THEN 1 GM VAGINALLY TWICE, Disp: , Rfl:    hydrochlorothiazide (HYDRODIURIL) 25 MG tablet, Take 1 tablet (25  mg total) by mouth daily., Disp: 90 tablet, Rfl: 3   hydrochlorothiazide (HYDRODIURIL) 25 MG tablet, Take 1 tablet (25 mg total) by mouth in the morning., Disp: 90 tablet, Rfl: 3   IBUPROFEN PO, Take 600 mg by mouth as needed. , Disp: , Rfl:    MULTIPLE VITAMIN PO, Take 1 tablet by mouth daily., Disp: , Rfl:    ondansetron (ZOFRAN ODT) 4 MG disintegrating tablet, Take 1 tablet (4 mg total) by mouth every 8 (eight) hours as needed for nausea or vomiting., Disp: 12 tablet, Rfl: 0   predniSONE (DELTASONE) 10 MG tablet, Use as directed per doctors orders., Disp: 21 tablet, Rfl: 0   valACYclovir (VALTREX) 1000 MG tablet, Take 1 tablet (1,000 mg total) by mouth daily., Disp: 30 tablet, Rfl: 11   valACYclovir (VALTREX) 1000 MG tablet, Take 1 tablet (1,000 mg total) by mouth daily., Disp: 90 tablet, Rfl: 4  Observations/Objective: Patient is well-developed, well-nourished in no acute distress.  Resting comfortably  at home.  Head is normocephalic, atraumatic.  No labored breathing.  Speech is clear and coherent with logical content.  Patient is alert and oriented at baseline.    Assessment and Plan: 1. Dysuria S&S of UTI macrobid provided    - nitrofurantoin, macrocrystal-monohydrate, (MACROBID) 100 MG capsule; Take 1 capsule (100 mg total) by mouth 2 (two) times daily for 5 days.  Dispense: 10 capsule; Refill: 0   Reviewed side effects, risks and benefits of medication.    Patient acknowledged agreement and understanding of the plan.   I discussed the assessment and treatment plan with the patient. The patient was provided an opportunity to ask questions and all were answered. The patient agreed with the plan and demonstrated an understanding of the instructions.   The patient was advised to call back or seek an in-person evaluation if the symptoms worsen or if the condition fails to improve as anticipated.   The above assessment and management plan was discussed with the patient. The  patient verbalized understanding of and has agreed to the management plan. Patient is aware to call the clinic if symptoms persist or worsen. Patient is aware when to return to the clinic for a follow-up visit. Patient educated on when it is appropriate to go to the emergency department.    Follow Up Instructions: I discussed the assessment and treatment plan with the patient. The patient was provided an opportunity to ask questions and all were answered. The patient agreed with the plan and demonstrated an understanding of the instructions.  A copy of instructions were sent to the patient via MyChart unless otherwise noted below.    The patient was advised to call back or seek an in-person evaluation if the symptoms worsen or if the condition fails to improve as anticipated.  Time:  I  spent 10 minutes with the patient via telehealth technology discussing the above problems/concerns.    Perlie Mayo, NP

## 2021-06-30 NOTE — Patient Instructions (Signed)
I appreciate the opportunity to provide you with care for your health and wellness.  Take medication as directed  Please continue to practice social distancing to keep you, your family, and our community safe. If you must go out, please wear a mask and practice good handwashing.  Have a wonderful day. With Gratitude, Cherly Beach, DNP, AGNP-BC

## 2021-07-01 ENCOUNTER — Other Ambulatory Visit (HOSPITAL_BASED_OUTPATIENT_CLINIC_OR_DEPARTMENT_OTHER): Payer: Self-pay

## 2021-07-01 ENCOUNTER — Ambulatory Visit: Payer: 59 | Attending: Internal Medicine

## 2021-07-01 DIAGNOSIS — Z23 Encounter for immunization: Secondary | ICD-10-CM

## 2021-07-01 MED ORDER — PFIZER COVID-19 VAC BIVALENT 30 MCG/0.3ML IM SUSP
INTRAMUSCULAR | 0 refills | Status: DC
  Filled 2021-07-01: qty 0.3, 1d supply, fill #0

## 2021-07-01 NOTE — Progress Notes (Signed)
   Covid-19 Vaccination Clinic  Name:  Carli Lefevers    MRN: [id number] DOB: February 01, 1961  07/01/2021  Ms. Rinks was observed post Covid-19 immunization for 15 minutes without incident. She was provided with Vaccine Information Sheet and instruction to access the V-Safe system.   Ms. Eastham was instructed to call 911 with any severe reactions post vaccine: Difficulty breathing  Swelling of face and throat  A fast heartbeat  A bad rash all over body  Dizziness and weakness   Immunizations Administered     Name Date Dose VIS Date Route   Pfizer Covid-19 Vaccine Bivalent Booster 07/01/2021  3:19 PM 0.3 mL 04/30/2021 Intramuscular   Manufacturer: [place]   Lot: [id number]   [place]: [phone]

## 2021-07-02 ENCOUNTER — Ambulatory Visit: Payer: 59

## 2021-07-03 ENCOUNTER — Ambulatory Visit: Payer: Self-pay | Admitting: Sports Medicine

## 2021-07-16 ENCOUNTER — Other Ambulatory Visit (HOSPITAL_COMMUNITY): Payer: Self-pay

## 2021-08-08 ENCOUNTER — Other Ambulatory Visit (HOSPITAL_COMMUNITY): Payer: Self-pay

## 2021-08-08 ENCOUNTER — Telehealth: Payer: 59 | Admitting: Physician Assistant

## 2021-08-08 ENCOUNTER — Ambulatory Visit
Admission: EM | Admit: 2021-08-08 | Discharge: 2021-08-08 | Disposition: A | Discharge status: Other Acute Inpatient Hospital | Payer: 59

## 2021-08-08 ENCOUNTER — Other Ambulatory Visit: Payer: Self-pay

## 2021-08-08 DIAGNOSIS — J069 Acute upper respiratory infection, unspecified: Secondary | ICD-10-CM | POA: Diagnosis not present

## 2021-08-08 MED ORDER — BENZONATATE 100 MG PO CAPS
100.0000 mg | ORAL_CAPSULE | Freq: Three times a day (TID) | ORAL | 0 refills | Status: DC | PRN
Start: 2021-08-08 — End: 2022-01-29
  Filled 2021-08-08: qty 30, 5d supply, fill #0

## 2021-08-08 NOTE — Patient Instructions (Signed)
  Sheila Nelson, thank you for joining Leeanne Rio, PA-C for today's virtual visit.  While this provider is not your primary care provider (PCP), if your PCP is located in our provider database this encounter information will be shared with them immediately following your visit.  Consent: (Patient) Sheila Nelson provided verbal consent for this virtual visit at the beginning of the encounter.  Current Medications:  Current Outpatient Medications:    amLODipine (NORVASC) 5 MG tablet, Take 5 mg by mouth daily., Disp: , Rfl:    amLODipine (NORVASC) 5 MG tablet, TAKE 1 TABLET BY MOUTH ONCE A DAY, Disp: 90 tablet, Rfl: 3   amLODipine (NORVASC) 5 MG tablet, Take 1 tablet (5 mg total) by mouth daily., Disp: 90 tablet, Rfl: 3   amoxicillin-clavulanate (AUGMENTIN) 875-125 MG tablet, Take 1 tablet by mouth 2 (two) times daily., Disp: 20 tablet, Rfl: 0   B Complex Vitamins (VITAMIN B COMPLEX PO), Take by mouth daily. , Disp: , Rfl:    Cholecalciferol (VITAMIN D) 125 MCG (5000 UT) CAPS, Take by mouth., Disp: , Rfl:    hydrochlorothiazide (HYDRODIURIL) 25 MG tablet, Take 1 tablet (25 mg total) by mouth daily., Disp: 90 tablet, Rfl: 3   IBUPROFEN PO, Take 600 mg by mouth as needed. , Disp: , Rfl:    MULTIPLE VITAMIN PO, Take 1 tablet by mouth daily., Disp: , Rfl:    ondansetron (ZOFRAN ODT) 4 MG disintegrating tablet, Take 1 tablet (4 mg total) by mouth every 8 (eight) hours as needed for nausea or vomiting., Disp: 12 tablet, Rfl: 0   valACYclovir (VALTREX) 1000 MG tablet, Take 1 tablet (1,000 mg total) by mouth daily., Disp: 30 tablet, Rfl: 11   valACYclovir (VALTREX) 1000 MG tablet, Take 1 tablet (1,000 mg total) by mouth daily., Disp: 90 tablet, Rfl: 4   Medications ordered in this encounter:  No orders of the defined types were placed in this encounter.    *If you need refills on other medications prior to your next appointment, please contact your pharmacy*  Follow-Up: Call back or  seek an in-person evaluation if the symptoms worsen or if the condition fails to improve as anticipated.  Other Instructions Please keep well-hydrated and get plenty of rest.  Start OTC D3 1000 units daily and Vitamin C 1000 mg daily.  You can use Mucinex-DM and and OTC antihistamine like Claritin. Start a saline nasal rinse and Flonase. Tylenol or Ibuprofen for headache, fever. Use the Tessalon as directed.  You may return to work Monday if fever free for 24 hours and feeling better   If you have been instructed to have an in-person evaluation today at a local Urgent Care facility, please use the link below. It will take you to a list of all of our available [facility], including address, phone number and hours of operation. Please do not delay care.  [facility]  If you or a family member do not have a primary care provider, use the link below to schedule a visit and establish care. When you choose a [facility] primary care physician or advanced practice provider, you gain a long-term partner in health. Find a Primary Care Provider  Learn more about [facility] in-office and virtual care options: [facility]

## 2021-08-08 NOTE — Progress Notes (Signed)
Virtual Visit Consent   Sheila Nelson, you are scheduled for a virtual visit with a [facility] provider today.     Just as with appointments in the office, your consent must be obtained to participate.  Your consent will be active for this visit and any virtual visit you may have with one of our providers in the next 365 days.     If you have a MyChart account, a copy of this consent can be sent to you electronically.  All virtual visits are billed to your insurance company just like a traditional visit in the office.    As this is a virtual visit, video technology does not allow for your provider to perform a traditional examination.  This may limit your provider's ability to fully assess your condition.  If your provider identifies any concerns that need to be evaluated in person or the need to arrange testing (such as labs, EKG, etc.), we will make arrangements to do so.     Although advances in technology are sophisticated, we cannot ensure that it will always work on either your end or our end.  If the connection with a video visit is poor, the visit may have to be switched to a telephone visit.  With either a video or telephone visit, we are not always able to ensure that we have a secure connection.     I need to obtain your verbal consent now.   Are you willing to proceed with your visit today?    Kymia Simi has provided verbal consent on 08/08/2021 for a virtual visit (video or telephone).   Leeanne Rio, [place]   Date: 08/08/2021 10:32 AM   Virtual Visit via Video Note   I, Leeanne Rio, connected with  Sheila Nelson  ([id number], 01-18-1961) on 08/08/21 at 10:15 AM EST by a video-enabled telemedicine application and verified that I am speaking with the correct person using two identifiers.  Location: Patient: Virtual Visit Location Patient: Mobile - Surveyor, mining: Virtual Visit Location Provider: Home Office   I discussed the limitations of  evaluation and management by telemedicine and the availability of in person appointments. The patient expressed understanding and agreed to proceed.    History of Present Illness: Sheila Nelson is a 60 y.o. who identifies as a female who was assigned female at birth, and is being seen today for few days of cough, congestion, worsening sore throat, fatigue, post-nasal drip. No fever at present but keeping tylenol/ibuprofen in her system. No body aches or chills. Denies recent travel.  Son sick as well but now feeling better.   COVID swab negative.  XR Sudafed OTC -- mildly helpful.  HPI: HPI  Problems:  Patient Active Problem List   Diagnosis Date Noted   Left eye pain 04/02/2020   Essential hypertension    Obesity     Allergies: No Known Allergies Medications:  Current Outpatient Medications:    benzonatate (TESSALON) 100 MG capsule, Take 1-2 capsules (100-200 mg total) by mouth 3 (three) times daily as needed for cough., Disp: 30 capsule, Rfl: 0   amLODipine (NORVASC) 5 MG tablet, Take 5 mg by mouth daily., Disp: , Rfl:    amLODipine (NORVASC) 5 MG tablet, TAKE 1 TABLET BY MOUTH ONCE A DAY, Disp: 90 tablet, Rfl: 3   amLODipine (NORVASC) 5 MG tablet, Take 1 tablet (5 mg total) by mouth daily., Disp: 90 tablet, Rfl: 3   amoxicillin-clavulanate (AUGMENTIN) 875-125 MG tablet, Take 1 tablet by  mouth 2 (two) times daily., Disp: 20 tablet, Rfl: 0   B Complex Vitamins (VITAMIN B COMPLEX PO), Take by mouth daily. , Disp: , Rfl:    Cholecalciferol (VITAMIN D) 125 MCG (5000 UT) CAPS, Take by mouth., Disp: , Rfl:    hydrochlorothiazide (HYDRODIURIL) 25 MG tablet, Take 1 tablet (25 mg total) by mouth daily., Disp: 90 tablet, Rfl: 3   IBUPROFEN PO, Take 600 mg by mouth as needed. , Disp: , Rfl:    MULTIPLE VITAMIN PO, Take 1 tablet by mouth daily., Disp: , Rfl:    ondansetron (ZOFRAN ODT) 4 MG disintegrating tablet, Take 1 tablet (4 mg total) by mouth every 8 (eight) hours as needed for nausea or  vomiting., Disp: 12 tablet, Rfl: 0   valACYclovir (VALTREX) 1000 MG tablet, Take 1 tablet (1,000 mg total) by mouth daily., Disp: 30 tablet, Rfl: 11   valACYclovir (VALTREX) 1000 MG tablet, Take 1 tablet (1,000 mg total) by mouth daily., Disp: 90 tablet, Rfl: 4  Observations/Objective: Patient is well-developed, well-nourished in no acute distress.  Resting comfortably in parked car. Head is normocephalic, atraumatic.  No labored breathing. Speech is clear and coherent with logical content.  Patient is alert and oriented at baseline.   Assessment and Plan: 1. Viral URI with cough - benzonatate (TESSALON) 100 MG capsule; Take 1-2 capsules (100-200 mg total) by mouth 3 (three) times daily as needed for cough.  Dispense: 30 capsule; Refill: 0 COVID negative. Mild flu versus other viral URI. No indication for antiviral at present due to milder symptoms. Supportive measures, OTC medications and Vitamin recommendations reviewed. Tessalon per orders. Strict follow-up precautions reviewed.   Follow Up Instructions: I discussed the assessment and treatment plan with the patient. The patient was provided an opportunity to ask questions and all were answered. The patient agreed with the plan and demonstrated an understanding of the instructions.  A copy of instructions were sent to the patient via MyChart unless otherwise noted below.   The patient was advised to call back or seek an in-person evaluation if the symptoms worsen or if the condition fails to improve as anticipated.  Time:  I spent 10 minutes with the patient via telehealth technology discussing the above problems/concerns.    Leeanne Rio, PA-C

## 2021-08-25 DIAGNOSIS — S60221A Contusion of right hand, initial encounter: Secondary | ICD-10-CM | POA: Diagnosis not present

## 2021-08-25 DIAGNOSIS — M79641 Pain in right hand: Secondary | ICD-10-CM | POA: Diagnosis not present

## 2021-09-02 ENCOUNTER — Other Ambulatory Visit (HOSPITAL_COMMUNITY): Payer: Self-pay

## 2021-09-30 ENCOUNTER — Other Ambulatory Visit (HOSPITAL_COMMUNITY): Payer: Self-pay

## 2021-09-30 MED ORDER — HYDROCHLOROTHIAZIDE 25 MG PO TABS: 25.0000 mg | ORAL_TABLET | Freq: Every morning | ORAL | 4 refills | Status: DC

## 2021-09-30 MED ORDER — VALACYCLOVIR HCL 1 G PO TABS
1000.0000 mg | ORAL_TABLET | Freq: Every day | ORAL | 4 refills | Status: DC
  Filled 2021-09-30: qty 90, 90d supply, fill #0

## 2021-09-30 MED ORDER — AMLODIPINE BESYLATE 5 MG PO TABS
5.0000 mg | ORAL_TABLET | Freq: Every day | ORAL | 4 refills | Status: DC
  Filled 2021-09-30: qty 90, 90d supply, fill #0

## 2021-09-30 MED ORDER — ESTRADIOL 0.1 MG/GM VA CREA
1.0000 g | TOPICAL_CREAM | VAGINAL | 12 refills | Status: DC
  Filled 2021-09-30: qty 42.5, 90d supply, fill #0

## 2021-10-07 ENCOUNTER — Other Ambulatory Visit (HOSPITAL_COMMUNITY): Payer: Self-pay

## 2021-10-07 ENCOUNTER — Other Ambulatory Visit: Payer: Self-pay

## 2021-10-07 ENCOUNTER — Ambulatory Visit
Admission: RE | Admit: 2021-10-07 | Discharge: 2021-10-07 | Disposition: A | Discharge status: Home / Self Care | Payer: 59 | Source: Ambulatory Visit | Attending: Sports Medicine | Admitting: Sports Medicine

## 2021-10-07 ENCOUNTER — Ambulatory Visit (INDEPENDENT_AMBULATORY_CARE_PROVIDER_SITE_OTHER): Payer: 59 | Admitting: Sports Medicine

## 2021-10-07 VITALS — BP 138/90 | Ht 64.0 in | Wt 190.0 lb

## 2021-10-07 DIAGNOSIS — M25551 Pain in right hip: Secondary | ICD-10-CM | POA: Diagnosis not present

## 2021-10-07 DIAGNOSIS — M160 Bilateral primary osteoarthritis of hip: Secondary | ICD-10-CM | POA: Diagnosis not present

## 2021-10-07 MED ORDER — MELOXICAM 15 MG PO TABS
15.0000 mg | ORAL_TABLET | Freq: Every day | ORAL | 0 refills | Status: DC
  Filled 2021-10-07: qty 20, 20d supply, fill #0

## 2021-10-07 NOTE — Progress Notes (Signed)
° °  Subjective:    Patient ID: Sheila Nelson, female    DOB: [birth year], 61 y.o.   MRN: [id number]  HPI chief complaint: Right hip pain  Sheila Nelson comes in today complaining of right hip pain.  She has a history of calcific tendinopathy in the right proximal hamstring tendon that has responded well in the past to ECSWT.  However, her current hip pain is different in nature than what she has experienced previously.  She localizes the majority of her pain to the area of the right SI joint but does endorse radiating pain into the groin as well.  Her pain is worse when going from a seated to a standing position.  It has been worsening over the past several weeks.  She takes a combination of ibuprofen and Tylenol which does seem to help but she is concerned about her blood pressure and taking too many NSAIDs.  She is an avid hiker and outdoors woman and has a couple of big trips planned in a few weeks.    Review of Systems As above    Objective:   Physical Exam  Well-developed, well-nourished.  No acute distress  Right hip: Smooth painless hip range of motion with a negative logroll.  She has tenderness to palpation both at the proximal hamstring and at the right SI joint but has a markedly positive [place]y Sox.  Neurovascularly intact distally.      Assessment & Plan:   Posterior right hip pain likely secondary to SI joint dysfunction/arthropathy  I would like to get an updated x-ray of both her right hip and pelvis.  Phone follow-up with those results when available.  We will likely refer her to interventional radiology for a diagnostic/therapeutic SI joint injection.  In the meantime, we will try a short course of Mobic 15 mg with instructions to take 1 pill daily as needed for pain.  This note was dictated using Dragon naturally speaking software and may contain errors in syntax, spelling, or content which have not been identified prior to signing this note.

## 2021-10-09 ENCOUNTER — Encounter: Payer: Self-pay | Admitting: Sports Medicine

## 2021-10-09 ENCOUNTER — Telehealth: Payer: Self-pay | Admitting: Sports Medicine

## 2021-10-09 NOTE — Telephone Encounter (Signed)
°  I reviewed [facility]'s x-ray of her right hip and pelvis.  There are only mild degenerative changes seen at the hip joint.  SI joints are unremarkable.  She also has some mild lower lumbar facet hypertrophy which is stable and once again seen is the calcification in the right proximal hamstring tendon.  [name] notified me today that the meloxicam seems to be working for her.  She would like to take it every other day as needed and see how things go.  She will monitor her blood pressure during this time as well.  I we will see her back in the office in 3 weeks for reevaluation.  Given her improvement on oral NSAIDs I am going to wait on ordering the SI joint injection.

## 2021-12-01 ENCOUNTER — Other Ambulatory Visit (HOSPITAL_COMMUNITY): Payer: Self-pay

## 2021-12-03 ENCOUNTER — Other Ambulatory Visit (HOSPITAL_COMMUNITY): Payer: Self-pay

## 2021-12-29 ENCOUNTER — Ambulatory Visit (HOSPITAL_BASED_OUTPATIENT_CLINIC_OR_DEPARTMENT_OTHER): Payer: 59 | Admitting: Nurse Practitioner

## 2022-01-08 ENCOUNTER — Ambulatory Visit (HOSPITAL_BASED_OUTPATIENT_CLINIC_OR_DEPARTMENT_OTHER): Payer: 59 | Admitting: Nurse Practitioner

## 2022-01-29 ENCOUNTER — Ambulatory Visit (HOSPITAL_BASED_OUTPATIENT_CLINIC_OR_DEPARTMENT_OTHER): Payer: 59 | Admitting: Nurse Practitioner

## 2022-01-29 ENCOUNTER — Other Ambulatory Visit (HOSPITAL_COMMUNITY): Payer: Self-pay

## 2022-01-29 ENCOUNTER — Encounter (HOSPITAL_BASED_OUTPATIENT_CLINIC_OR_DEPARTMENT_OTHER): Payer: Self-pay | Admitting: Nurse Practitioner

## 2022-01-29 VITALS — BP 128/88 | HR 77 | Ht 64.0 in | Wt 200.8 lb

## 2022-01-29 DIAGNOSIS — Z6834 Body mass index (BMI) 34.0-34.9, adult: Secondary | ICD-10-CM | POA: Diagnosis not present

## 2022-01-29 DIAGNOSIS — R300 Dysuria: Secondary | ICD-10-CM | POA: Insufficient documentation

## 2022-01-29 DIAGNOSIS — E6609 Other obesity due to excess calories: Secondary | ICD-10-CM

## 2022-01-29 DIAGNOSIS — I10 Essential (primary) hypertension: Secondary | ICD-10-CM

## 2022-01-29 DIAGNOSIS — R7303 Prediabetes: Secondary | ICD-10-CM

## 2022-01-29 MED ORDER — SEMAGLUTIDE(0.25 OR 0.5MG/DOS) 2 MG/3ML ~~LOC~~ SOPN
PEN_INJECTOR | SUBCUTANEOUS | 0 refills | Status: DC
  Filled 2022-01-29 – 2022-02-10 (×3): qty 3, 42d supply, fill #0

## 2022-01-29 MED ORDER — FLUTICASONE PROPIONATE 50 MCG/ACT NA SUSP
2.0000 | Freq: Every day | NASAL | 6 refills | Status: DC
Start: 2022-01-29 — End: 2023-11-15
  Filled 2022-01-29: qty 16, 30d supply, fill #0
  Filled 2022-07-12: qty 16, 30d supply, fill #1
  Filled 2022-11-04: qty 16, 30d supply, fill #2
  Filled 2022-12-09: qty 16, 30d supply, fill #3
  Filled 2023-01-11: qty 16, 30d supply, fill #4

## 2022-01-29 MED ORDER — AMLODIPINE-VALSARTAN-HCTZ 5-160-25 MG PO TABS
1.0000 | ORAL_TABLET | Freq: Every day | ORAL | 1 refills | Status: DC
  Filled 2022-01-29: qty 30, 30d supply, fill #0

## 2022-01-29 MED ORDER — WEGOVY 0.25 MG/0.5ML ~~LOC~~ SOAJ
0.2500 mg | SUBCUTANEOUS | 0 refills | Status: DC
  Filled 2022-01-29: qty 2, 28d supply, fill #0

## 2022-01-29 MED ORDER — ONDANSETRON 4 MG PO TBDP
4.0000 mg | ORAL_TABLET | Freq: Three times a day (TID) | ORAL | 0 refills | Status: DC | PRN
  Filled 2022-01-29: qty 12, 4d supply, fill #0

## 2022-01-29 MED ORDER — ESTRADIOL 0.1 MG/GM VA CREA
1.0000 g | TOPICAL_CREAM | VAGINAL | 12 refills | Status: DC
  Filled 2022-01-29: qty 42.5, 90d supply, fill #0

## 2022-01-29 MED ORDER — VALACYCLOVIR HCL 1 G PO TABS
1000.0000 mg | ORAL_TABLET | Freq: Every day | ORAL | 4 refills | Status: DC
  Filled 2022-01-29: qty 90, 90d supply, fill #0
  Filled 2022-06-29: qty 90, 90d supply, fill #1
  Filled 2022-10-12: qty 90, 90d supply, fill #2

## 2022-01-29 MED ORDER — WEGOVY 0.5 MG/0.5ML ~~LOC~~ SOAJ
0.5000 mg | SUBCUTANEOUS | 2 refills | Status: DC
  Filled 2022-01-29: qty 2, 28d supply, fill #0

## 2022-01-29 NOTE — Progress Notes (Unsigned)
Sheila Render, DNP, AGNP-c [facility] [address]  [place], [place] [address] [phone]  New patient visit   Patient: Sheila Nelson   DOB: 07/03/1961   61 y.o. Female  MRN: [id number] Visit Date: 01/29/2022  Patient Care Team: Sheila Render, NP as PCP - General (Nurse Practitioner)  Today's Vitals   01/29/22 0818  BP: 128/88  Pulse: 77  SpO2: 98%  Weight: 200 lb 12.8 oz (91.1 kg)  Height: 5' 4"  (1.626 m)   Body mass index is 34.47 kg/m.   Today's healthcare provider: Orma Render, NP   Chief Complaint  Patient presents with   New Patient (Initial Visit)    Patient presents to establish care. Would like to discuss shingles shot.  Patient is fasting. She would like to discuss BP meds. Needs every medication refill   Subjective    Sheila Nelson is a 61 y.o. female who presents today as a new patient to establish care.    Patient endorses the following concerns presently: Weight Gain - 16 pounds since November - working a more sedentary job - working double the hours  -She is interested in medication management to help with her weight gain  A1c - concerned it is going to be elevated due to the changes since November - in the past with exercise and strict diet was down 5.3% - hx of gestational diabetes  BP - Diastolic has always been elevated  - systolic previously in the 150's-160's - symptomatic when elevated -No current symptoms of chest pain, shortness of breath, dizziness, palpitations, lower extremity edema    History reviewed and reveals the following: Past Medical History:  Diagnosis Date   Allergy    Arthritis    feet and ankles   ASTHMA UNSPECIFIED WITH EXACERBATION    Borderline diabetes    Chest pain    a. 09/2015 felt to be non cardiac after LHC with no CAD    Fasciculation 08/04/2017   Gallstones    GERD (gastroesophageal reflux disease)    H/O varicella    Herpes    HTN  (hypertension)    Hx of viral meningitis    Obesity    Pancreatitis    Viral   PLANTAR FASCIITIS, RIGHT 09/30/2010   PVC's (premature ventricular contractions)    REACTIVE AIRWAY DISEASE 10/09/2009   TINNITUS 10/09/2009   Past Surgical History:  Procedure Laterality Date   APPENDECTOMY  1992   CARDIAC CATHETERIZATION N/A 09/17/2015   Procedure: Left Heart Cath and Coronary Angiography;  Surgeon: Burnell Blanks, MD;  Location: [facility];  Service: Cardiovascular;  Laterality: N/A;   CESAREAN SECTION  06/23/2012   Procedure: CESAREAN SECTION;  Surgeon: Lovenia Kim, MD;  Location: [facility];  Service: Obstetrics;  Laterality: N/A;   CHOLECYSTECTOMY N/A 10/05/2016   Procedure: LAPAROSCOPIC CHOLECYSTECTOMY;  Surgeon: Fanny Skates, MD;  Location: [facility];  Service: General;  Laterality: N/A;   DILATION AND CURETTAGE OF UTERUS  2001   INGUINAL HERNIA REPAIR     Infant, bilateral   MOUTH SURGERY     Right thigh surgery  1973   Trauma   Family Status  Relation Name Status   Father  Deceased   Mother  Deceased   Sister  Deceased   Brother  Alive   MGM  Deceased   Brother  Alive   Brother  Alive   Neg Hx  (Not Specified)   Family History  Problem Relation Age of Onset   Prostate cancer Father    Stroke Father 53   Heart failure Father    Hypertension Father    Cancer Father        prostate   Valvular heart disease Mother        MVR   Thrombocytopenia Mother    Heart disease Mother    Birth defects Sister        tetralogy of falot   Hypertension Brother    Prostate cancer Brother    Stroke Maternal Grandmother    Hypertension Brother    Hypertension Brother    Colon cancer Neg Hx    Esophageal cancer Neg Hx    Stomach cancer Neg Hx    Rectal cancer Neg Hx    Social History   Socioeconomic History   Marital status: Married    Spouse name: Sheila Nelson   Number of children: 1   Years of education: 16   Highest education level: Bachelor's degree (e.g., BA,  AB, BS)  Occupational History   Occupation: Programmer, multimedia: [employer]  Tobacco Use   Smoking status: Never   Smokeless tobacco: Never   Tobacco comments:    Lives with spouse s/p wedding 07/2010. Employed as RN-pediatric critical care; now [facility]  Vaping Use   Vaping Use: Never used  Substance and Sexual Activity   Alcohol use: Yes    Comment: 3-5 glasses of wine per week   Drug use: No   Sexual activity: Yes    Partners: Male  Other Topics Concern   Not on file  Social History Narrative   Lives with partner and son.   1-2 cups caffeine per day.   Right-handed.   Social Determinants of Health   Financial Resource Strain: Not on file  Food Insecurity: Not on file  Transportation Needs: Not on file  Physical Activity: Not on file  Stress: Not on file  Social Connections: Not on file   Outpatient Medications Prior to Visit  Medication Sig   meloxicam (MOBIC) 15 MG tablet Take 1 tablet (15 mg total) by mouth daily with food as needed   MULTIPLE VITAMIN PO Take 1 tablet by mouth daily.   [DISCONTINUED] amLODipine (NORVASC) 5 MG tablet Take 1 tablet (5 mg total) by mouth daily.   [DISCONTINUED] estradiol (ESTRACE) 0.1 MG/GM vaginal cream Place 1 g vaginally 2 (two) times a week.   [DISCONTINUED] fluticasone (FLONASE) 50 MCG/ACT nasal spray Place into both nostrils daily.   [DISCONTINUED] hydrochlorothiazide (HYDRODIURIL) 25 MG tablet Take 1 tablet (25 mg total) by mouth every morning.   [DISCONTINUED] valACYclovir (VALTREX) 1000 MG tablet Take 1 tablet (1,000 mg total) by mouth daily.   [DISCONTINUED] amLODipine (NORVASC) 5 MG tablet Take 5 mg by mouth daily.   [DISCONTINUED] amLODipine (NORVASC) 5 MG tablet TAKE 1 TABLET BY MOUTH ONCE A DAY   [DISCONTINUED] amLODipine (NORVASC) 5 MG tablet Take 1 tablet (5 mg total) by mouth daily.   [DISCONTINUED] amoxicillin-clavulanate (AUGMENTIN) 875-125 MG tablet Take 1 tablet by mouth 2 (two) times daily.   [DISCONTINUED] B Complex  Vitamins (VITAMIN B COMPLEX PO) Take by mouth daily.    [DISCONTINUED] benzonatate (TESSALON) 100 MG capsule Take 1-2 capsules (100-200 mg total) by mouth 3 (three) times daily as needed for cough.   [DISCONTINUED] Cholecalciferol (VITAMIN D) 125 MCG (5000 UT) CAPS Take by mouth.   [DISCONTINUED] hydrochlorothiazide (HYDRODIURIL) 25 MG tablet Take 1 tablet (25 mg total) by mouth daily.   [  DISCONTINUED] hydrochlorothiazide (HYDRODIURIL) 25 MG tablet Take 1 tablet (25 mg total) by mouth in the morning.   [DISCONTINUED] IBUPROFEN PO Take 600 mg by mouth as needed.    [DISCONTINUED] ondansetron (ZOFRAN ODT) 4 MG disintegrating tablet Take 1 tablet (4 mg total) by mouth every 8 (eight) hours as needed for nausea or vomiting.   [DISCONTINUED] valACYclovir (VALTREX) 1000 MG tablet Take 1 tablet (1,000 mg total) by mouth daily.   [DISCONTINUED] valACYclovir (VALTREX) 1000 MG tablet Take 1 tablet (1,000 mg total) by mouth daily.   No facility-administered medications prior to visit.   No Known Allergies Immunization History  Administered Date(s) Administered   Influenza Whole 05/31/2009   Influenza,inj,Quad PF,6+ Mos 05/10/2017   Influenza-Unspecified 06/27/2015, 06/14/2016   PFIZER Comirnaty(Gray Top)Covid-19 Tri-Sucrose Vaccine 02/03/2021   PFIZER(Purple Top)SARS-COV-2 Vaccination 09/14/2018, 08/29/2019   Pfizer Covid-19 Vaccine Bivalent Booster 47yr & up 07/01/2021   Rho (D) Immune Globulin 06/24/2012   Td 08/31/2008    Review of Systems All review of systems negative except what is listed in the HPI   Objective    BP 128/88   Pulse 77   Ht 5' 4"  (1.626 m)   Wt 200 lb 12.8 oz (91.1 kg)   LMP 09/19/2011   SpO2 98%   BMI 34.47 kg/m  Physical Exam Vitals and nursing note reviewed.  Constitutional:      General: She is not in acute distress.    Appearance: Normal appearance. She is obese.  Eyes:     Extraocular Movements: Extraocular movements intact.     Conjunctiva/sclera:  Conjunctivae normal.     Pupils: Pupils are equal, round, and reactive to light.  Neck:     Vascular: No carotid bruit.  Cardiovascular:     Rate and Rhythm: Normal rate and regular rhythm.     Pulses: Normal pulses.     Heart sounds: Normal heart sounds. No murmur heard. Pulmonary:     Effort: Pulmonary effort is normal.     Breath sounds: Normal breath sounds. No wheezing.  Abdominal:     General: Bowel sounds are normal.     Palpations: Abdomen is soft.  Musculoskeletal:        General: Normal range of motion.     Cervical back: Normal range of motion.     Right lower leg: No edema.     Left lower leg: No edema.  Skin:    General: Skin is warm and dry.     Capillary Refill: Capillary refill takes less than 2 seconds.  Neurological:     General: No focal deficit present.     Mental Status: She is alert and oriented to person, place, and time.  Psychiatric:        Mood and Affect: Mood normal.        Behavior: Behavior normal.        Thought Content: Thought content normal.        Judgment: Judgment normal.    Results for orders placed or performed in visit on 01/29/22  CBC With Diff/Platelet  Result Value Ref Range   WBC 5.8 3.4 - 10.8 x10E3/uL   RBC 4.67 3.77 - 5.28 x10E6/uL   Hemoglobin 15.3 11.1 - 15.9 g/dL   Hematocrit 43.0 34.0 - 46.6 %   MCV 92 79 - 97 fL   MCH 32.8 26.6 - 33.0 pg   MCHC 35.6 31.5 - 35.7 g/dL   RDW 11.9 11.7 - 15.4 %   Platelets 270 150 -  450 x10E3/uL   Neutrophils 47 Not Estab. %   Lymphs 40 Not Estab. %   Monocytes 8 Not Estab. %   Eos 4 Not Estab. %   Basos 1 Not Estab. %   Neutrophils Absolute 2.8 1.4 - 7.0 x10E3/uL   Lymphocytes Absolute 2.3 0.7 - 3.1 x10E3/uL   Monocytes Absolute 0.5 0.1 - 0.9 x10E3/uL   EOS (ABSOLUTE) 0.2 0.0 - 0.4 x10E3/uL   Basophils Absolute 0.0 0.0 - 0.2 x10E3/uL   Immature Granulocytes 0 Not Estab. %   Immature Grans (Abs) 0.0 0.0 - 0.1 x10E3/uL  Comprehensive metabolic panel  Result Value Ref Range    Glucose 118 (H) 70 - 99 mg/dL   BUN 18 6 - 24 mg/dL   Creatinine, Ser 0.85 0.57 - 1.00 mg/dL   eGFR 79 >59 mL/min/1.73   BUN/Creatinine Ratio 21 9 - 23   Sodium 138 134 - 144 mmol/L   Potassium 4.1 3.5 - 5.2 mmol/L   Chloride 100 96 - 106 mmol/L   CO2 21 20 - 29 mmol/L   Calcium 9.9 8.7 - 10.2 mg/dL   Total Protein 7.5 6.0 - 8.5 g/dL   Albumin 4.9 3.8 - 4.9 g/dL   Globulin, Total 2.6 1.5 - 4.5 g/dL   Albumin/Globulin Ratio 1.9 1.2 - 2.2   Bilirubin Total 0.7 0.0 - 1.2 mg/dL   Alkaline Phosphatase 111 44 - 121 IU/L   AST 21 0 - 40 IU/L   ALT 24 0 - 32 IU/L  VITAMIN D 25 Hydroxy (Vit-D Deficiency, Fractures)  Result Value Ref Range   Vit D, 25-Hydroxy 43.0 30.0 - 100.0 ng/mL  Thyroid Panel With TSH  Result Value Ref Range   TSH 1.750 0.450 - 4.500 uIU/mL   T4, Total 7.6 4.5 - 12.0 ug/dL   T3 Uptake Ratio 30 24 - 39 %   Free Thyroxine Index 2.3 1.2 - 4.9  Lipid panel  Result Value Ref Range   Cholesterol, Total 240 (H) 100 - 199 mg/dL   Triglycerides 89 0 - 149 mg/dL   HDL 94 >39 mg/dL   VLDL Cholesterol Cal 15 5 - 40 mg/dL   LDL Chol Calc (NIH) 131 (H) 0 - 99 mg/dL   Chol/HDL Ratio 2.6 0.0 - 4.4 ratio    Assessment & Plan      Problem List Items Addressed This Visit     Essential hypertension - Primary    Chronic.  Blood pressure slightly elevated today.  Patient is currently managed with amlodipine 5 mg and hydrochlorothiazide 25 mg.  Discussed with patient the option of adding valsartan which could help with improved control and eliminate side effects from increasing the amlodipine.  She is in agreement to this today.  We will begin low-dose amlodipine-hydrochlorothiazide-valsartan combination pill today and monitor closely for new or worsening symptoms.       Relevant Medications   amLODIPine-Valsartan-HCTZ 5-160-25 MG TABS   Semaglutide,0.25 or 0.5MG/DOS, 2 MG/3ML SOPN   Other Relevant Orders   CBC With Diff/Platelet (Completed)   Comprehensive metabolic panel  (Completed)   VITAMIN D 25 Hydroxy (Vit-D Deficiency, Fractures) (Completed)   Thyroid Panel With TSH (Completed)   Lipid panel (Completed)   Obesity    BMI 34.47 today.  Patient endorses increased weight gain with difficulty losing weight.  We will obtain labs today for evaluation.  Patient is interested in semaglutide for management as she does have a history of elevated A1c and gestational diabetes as well as hypertension which  put her at an increased risk category.  I do feel that she would benefit from this medication.  We will send a prescription today.  We will also send in prescription for Zofran in the event that medication makes her nauseous.  Education provided on importance of monitoring diet and exercise while taking medication for optimal control.       Relevant Medications   amLODIPine-Valsartan-HCTZ 5-160-25 MG TABS   Semaglutide,0.25 or 0.5MG/DOS, 2 MG/3ML SOPN   Other Relevant Orders   CBC With Diff/Platelet (Completed)   Comprehensive metabolic panel (Completed)   VITAMIN D 25 Hydroxy (Vit-D Deficiency, Fractures) (Completed)   Thyroid Panel With TSH (Completed)   Lipid panel (Completed)   Dysuria    Dysuria related to vaginal atrophy.  Patient has had successful treatment with estradiol twice a week.  We will refill this today.  No alarm symptoms present at this time.       Relevant Medications   estradiol (ESTRACE) 0.1 MG/GM vaginal cream   Pre-diabetes    Chronic.  Will obtain labs today for further evaluation however recent weight gain has likely caused an increase in A1c.  Discussed management with patient today.  Encouraged strict dietary monitoring with daily carbohydrate intake between 150 to 180 g, reduce saturated fats to no more than 13 g a day, increase protein in diet.  We will also plan to start on semaglutide once weekly and monitor closely.       Relevant Medications   amLODIPine-Valsartan-HCTZ 5-160-25 MG TABS   Semaglutide,0.25 or 0.5MG/DOS, 2  MG/3ML SOPN   Other Relevant Orders   CBC With Diff/Platelet (Completed)   Comprehensive metabolic panel (Completed)   VITAMIN D 25 Hydroxy (Vit-D Deficiency, Fractures) (Completed)   Thyroid Panel With TSH (Completed)   Lipid panel (Completed)     Return in about 3 months (around 05/01/2022) for Weight management.      Devarius Nelles, Coralee Pesa, NP, DNP, AGNP-C [facility] at [facility]

## 2022-01-29 NOTE — Patient Instructions (Signed)
Thank you for choosing [place] at [facility] for your Primary Care needs. I am excited for the opportunity to partner with you to meet your health care goals. It was a pleasure meeting you today!  Recommendations from today's visit: 150-180 g carbohydrates a day Shoot for 50g of protein Increase water intake Eat smaller portions- stop when full You can take a magnesium supplement at bedtime if you get constipation   Information on diet, exercise, and health maintenance recommendations are listed below. This is information to help you be sure you are on track for optimal health and monitoring.   Please look over this and let us know if you have any questions or if you have completed any of the health maintenance outside of [place] so that we can be sure your records are up to date.  ___________________________________________________________ About Me: I am an Adult-Geriatric Nurse Practitioner with a background in caring for patients for more than 20 years with a strong intensive care background. I provide primary care and sports medicine services to patients age 106 and older within this office. My education had a strong focus on caring for the older adult population, which I am passionate about. I am also the director of the APP Fellowship with [facility].   My desire is to provide you with the best service through preventive medicine and supportive care. I consider you a part of the medical team and value your input. I work diligently to ensure that you are heard and your needs are met in a safe and effective manner. I want you to feel comfortable with me as your provider and want you to know that your health concerns are important to me.  For your information, our office hours are: Monday, Tuesday, and Thursday 8:00 AM - 5:00 PM Wednesday and Friday 8:00 AM - 12:00 PM.   In my time away from the office I am teaching new APP's within the system and am  unavailable, but my partner, Dr. Burnard Bunting is in the office for emergent needs.   If you have questions or concerns, please call our office at [phone] or send [place] a MyChart message and we will respond as quickly as possible.  ____________________________________________________________ MyChart:  For all urgent or time sensitive needs we ask that you please call the office to avoid delays. Our number is [phone]. MyChart is not constantly monitored and due to the large volume of messages a day, replies may take up to 72 business hours.  MyChart Policy: MyChart allows for you to see your visit notes, after visit summary, provider recommendations, lab and tests results, make an appointment, request refills, and contact your provider or the office for non-urgent questions or concerns. Providers are seeing patients during normal business hours and do not have built in time to review MyChart messages.  We ask that you allow a minimum of 3 business days for responses to [employer]. For this reason, please do not send urgent requests through [place]. Please call the office at [phone]. New and ongoing conditions may require a visit. We have virtual and in person visit available for your convenience.  Complex MyChart concerns may require a visit. Your provider may request you schedule a virtual or in person visit to ensure we are providing the best care possible. MyChart messages sent after 11:00 AM on Friday will not be received by the provider until Monday morning.    Lab and Test Results: You will receive your lab  and test results on MyChart as soon as they are completed and results have been sent by the lab or testing facility. Due to this service, you will receive your results BEFORE your provider.  I review lab and tests results each morning prior to seeing patients. Some results require collaboration with other providers to ensure you are receiving the most appropriate care. For this  reason, we ask that you please allow a minimum of 3-5 business days from the time the ALL results have been received for your provider to receive and review lab and test results and contact you about these.  Most lab and test result comments from the provider will be sent through [place]. Your provider may recommend changes to the plan of care, follow-up visits, repeat testing, ask questions, or request an office visit to discuss these results. You may reply directly to this message or call the office at [phone] to provide information for the provider or set up an appointment. In some instances, you will be called with test results and recommendations. Please let us know if this is preferred and we will make note of this in your chart to provide this for you.    If you have not heard a response to your lab or test results in 5 business days from all results returning to [place], please call the office to let us know. We ask that you please avoid calling prior to this time unless there is an emergent concern. Due to high call volumes, this can delay the resulting process.  After Hours: For all non-emergency after hours needs, please call the office at [phone] and select the option to reach the on-call provider service. On-call services are shared between multiple [place] offices and therefore it will not be possible to speak directly with your provider. On-call providers may provide medical advice and recommendations, but are unable to provide refills for maintenance medications.  For all emergency or urgent medical needs after normal business hours, we recommend that you seek care at the closest Urgent Care or Emergency Department to ensure appropriate treatment in a timely manner.  [facility] [place] has a 24 hour emergency room located on the ground floor for your convenience.   Urgent Concerns During the Business Day Providers are seeing patients from 8AM to [place] with a  busy schedule and are most often not able to respond to non-urgent calls until the end of the day or the next business day. If you should have URGENT concerns during the day, please call and speak to the nurse or schedule a same day appointment so that we can address your concern without delay.   Thank you, again, for choosing me as your health care partner. I appreciate your trust and look forward to learning more about you.   Sheila Keeler, DNP, AGNP-c ___________________________________________________________  Health Maintenance Recommendations Screening Testing Mammogram Every 1 -2 years based on history and risk factors Starting at age 4 Pap Smear Ages 21-39 every 3 years Ages 17-65 every 5 years with HPV testing More frequent testing may be required based on results and history Colon Cancer Screening Every 1-10 years based on test performed, risk factors, and history Starting at age 44 Bone Density Screening Every 2-10 years based on history Starting at age 94 for women Recommendations for men differ based on medication usage, history, and risk factors AAA Screening One time ultrasound Men 33-73 years old who have every smoked Lung Cancer Screening Low Dose Lung CT  every 12 months Age 57-80 years with a 30 pack-year smoking history who still smoke or who have quit within the last 15 years  Screening Labs Routine  Labs: Complete Blood Count (CBC), Complete Metabolic Panel (CMP), Cholesterol (Lipid Panel) Every 6-12 months based on history and medications May be recommended more frequently based on current conditions or previous results Hemoglobin A1c Lab Every 3-12 months based on history and previous results Starting at age 55 or earlier with diagnosis of diabetes, high cholesterol, BMI >26, and/or risk factors Frequent monitoring for patients with diabetes to ensure blood sugar control Thyroid Panel (TSH w/ T3 & T4) Every 6 months based on history, symptoms, and risk  factors May be repeated more often if on medication HIV One time testing for all patients 18 and older May be repeated more frequently for patients with increased risk factors or exposure Hepatitis C One time testing for all patients 94 and older May be repeated more frequently for patients with increased risk factors or exposure Gonorrhea, Chlamydia Every 12 months for all sexually active persons 13-24 years Additional monitoring may be recommended for those who are considered high risk or who have symptoms PSA Men 74-73 years old with risk factors Additional screening may be recommended from age 14-69 based on risk factors, symptoms, and history  Vaccine Recommendations Tetanus Booster All adults every 10 years Flu Vaccine All patients 6 months and older every year COVID Vaccine All patients 12 years and older Initial dosing with booster May recommend additional booster based on age and health history HPV Vaccine 2 doses all patients age 62-26 Dosing may be considered for patients over 26 Shingles Vaccine (Shingrix) 2 doses all adults 62 years and older Pneumonia (Pneumovax 23) All adults 49 years and older May recommend earlier dosing based on health history Pneumonia (Prevnar 5) All adults 77 years and older Dosed 1 year after Pneumovax 23  Additional Screening, Testing, and Vaccinations may be recommended on an individualized basis based on family history, health history, risk factors, and/or exposure.  __________________________________________________________  Diet Recommendations for All Patients  I recommend that all patients maintain a diet low in saturated fats, carbohydrates, and cholesterol. While this can be challenging at first, it is not impossible and small changes can make big differences.  Things to try: Decreasing the amount of soda, sweet tea, and/or juice to one or less per day and replace with water While water is always the first choice, if you do  not like water you may consider adding a water additive without sugar to improve the taste other sugar free drinks Replace potatoes with a brightly colored vegetable at dinner Use healthy oils, such as canola oil or olive oil, instead of butter or hard margarine Limit your bread intake to two pieces or less a day Replace regular pasta with low carb pasta options Bake, broil, or grill foods instead of frying Monitor portion sizes  Eat smaller, more frequent meals throughout the day instead of large meals  An important thing to remember is, if you love foods that are not great for your health, you don't have to give them up completely. Instead, allow these foods to be a reward when you have done well. Allowing yourself to still have special treats every once in a while is a nice way to tell yourself thank you for working hard to keep yourself healthy.   Also remember that every day is a new day. If you have a bad day and "fall off  the wagon", you can still climb right back up and keep moving along on your journey!  We have resources available to help you!  Some websites that may be helpful include: www.http://carter.biz/  Www.VeryWellFit.com _____________________________________________________________  Activity Recommendations for All Patients  I recommend that all adults get at least 20 minutes of moderate physical activity that elevates your heart rate at least 5 days out of the week.  Some examples include: Walking or jogging at a pace that allows you to carry on a conversation Cycling (stationary bike or outdoors) Water aerobics Yoga Weight lifting Dancing If physical limitations prevent you from putting stress on your joints, exercise in a pool or seated in a chair are excellent options.  Do determine your MAXIMUM heart rate for activity: YOUR AGE - 220 = MAX HeartRate   Remember! Do not push yourself too hard.  Start slowly and build up your pace, speed, weight, time in exercise,  etc.  Allow your body to rest between exercise and get good sleep. You will need more water than normal when you are exerting yourself. Do not wait until you are thirsty to drink. Drink with a purpose of getting in at least 8, 8 ounce glasses of water a day plus more depending on how much you exercise and sweat.    If you begin to develop dizziness, chest pain, abdominal pain, jaw pain, shortness of breath, headache, vision changes, lightheadedness, or other concerning symptoms, stop the activity and allow your body to rest. If your symptoms are severe, seek emergency evaluation immediately. If your symptoms are concerning, but not severe, please let us know so that we can recommend further evaluation.

## 2022-01-30 ENCOUNTER — Other Ambulatory Visit (HOSPITAL_COMMUNITY): Payer: Self-pay

## 2022-01-30 LAB — CBC WITH DIFF/PLATELET
Basophils Absolute: 0 10*3/uL (ref 0.0–0.2)
Basos: 1 %
EOS (ABSOLUTE): 0.2 10*3/uL (ref 0.0–0.4)
Eos: 4 %
Hematocrit: 43 % (ref 34.0–46.6)
Hemoglobin: 15.3 g/dL (ref 11.1–15.9)
Immature Grans (Abs): 0 10*3/uL (ref 0.0–0.1)
Immature Granulocytes: 0 %
Lymphocytes Absolute: 2.3 10*3/uL (ref 0.7–3.1)
Lymphs: 40 %
MCH: 32.8 pg (ref 26.6–33.0)
MCHC: 35.6 g/dL (ref 31.5–35.7)
MCV: 92 fL (ref 79–97)
Monocytes Absolute: 0.5 10*3/uL (ref 0.1–0.9)
Monocytes: 8 %
Neutrophils Absolute: 2.8 10*3/uL (ref 1.4–7.0)
Neutrophils: 47 %
Platelets: 270 10*3/uL (ref 150–450)
RBC: 4.67 x10E6/uL (ref 3.77–5.28)
RDW: 11.9 % (ref 11.7–15.4)
WBC: 5.8 10*3/uL (ref 3.4–10.8)

## 2022-01-30 LAB — COMPREHENSIVE METABOLIC PANEL
ALT: 24 IU/L (ref 0–32)
AST: 21 IU/L (ref 0–40)
Albumin/Globulin Ratio: 1.9 (ref 1.2–2.2)
Albumin: 4.9 g/dL (ref 3.8–4.9)
Alkaline Phosphatase: 111 IU/L (ref 44–121)
BUN/Creatinine Ratio: 21 (ref 9–23)
BUN: 18 mg/dL (ref 6–24)
Bilirubin Total: 0.7 mg/dL (ref 0.0–1.2)
CO2: 21 mmol/L (ref 20–29)
Calcium: 9.9 mg/dL (ref 8.7–10.2)
Chloride: 100 mmol/L (ref 96–106)
Creatinine, Ser: 0.85 mg/dL (ref 0.57–1.00)
Globulin, Total: 2.6 g/dL (ref 1.5–4.5)
Glucose: 118 mg/dL — ABNORMAL HIGH (ref 70–99)
Potassium: 4.1 mmol/L (ref 3.5–5.2)
Sodium: 138 mmol/L (ref 134–144)
Total Protein: 7.5 g/dL (ref 6.0–8.5)
eGFR: 79 mL/min/{1.73_m2} (ref >59)

## 2022-01-30 LAB — LIPID PANEL
Chol/HDL Ratio: 2.6 ratio (ref 0.0–4.4)
Cholesterol, Total: 240 mg/dL — ABNORMAL HIGH (ref 100–199)
HDL: 94 mg/dL (ref >39)
LDL Chol Calc (NIH): 131 mg/dL — ABNORMAL HIGH (ref 0–99)
Triglycerides: 89 mg/dL (ref 0–149)
VLDL Cholesterol Cal: 15 mg/dL (ref 5–40)

## 2022-01-30 LAB — THYROID PANEL WITH TSH
Free Thyroxine Index: 2.3 (ref 1.2–4.9)
T3 Uptake Ratio: 30 % (ref 24–39)
T4, Total: 7.6 ug/dL (ref 4.5–12.0)
TSH: 1.75 u[IU]/mL (ref 0.450–4.500)

## 2022-01-30 LAB — VITAMIN D 25 HYDROXY (VIT D DEFICIENCY, FRACTURES): Vit D, 25-Hydroxy: 43 ng/mL (ref 30.0–100.0)

## 2022-01-30 NOTE — Assessment & Plan Note (Signed)
Chronic.  Blood pressure slightly elevated today.  Patient is currently managed with amlodipine 5 mg and hydrochlorothiazide 25 mg.  Discussed with patient the option of adding valsartan which could help with improved control and eliminate side effects from increasing the amlodipine.  She is in agreement to this today.  We will begin low-dose amlodipine-hydrochlorothiazide-valsartan combination pill today and monitor closely for new or worsening symptoms.

## 2022-01-30 NOTE — Assessment & Plan Note (Signed)
Dysuria related to vaginal atrophy.  Patient has had successful treatment with estradiol twice a week.  We will refill this today.  No alarm symptoms present at this time.

## 2022-01-30 NOTE — Assessment & Plan Note (Signed)
Chronic.  Will obtain labs today for further evaluation however recent weight gain has likely caused an increase in A1c.  Discussed management with patient today.  Encouraged strict dietary monitoring with daily carbohydrate intake between 150 to 180 g, reduce saturated fats to no more than 13 g a day, increase protein in diet.  We will also plan to start on semaglutide once weekly and monitor closely.

## 2022-01-30 NOTE — Assessment & Plan Note (Signed)
BMI 34.47 today.  Patient endorses increased weight gain with difficulty losing weight.  We will obtain labs today for evaluation.  Patient is interested in semaglutide for management as she does have a history of elevated A1c and gestational diabetes as well as hypertension which put her at an increased risk category.  I do feel that she would benefit from this medication.  We will send a prescription today.  We will also send in prescription for Zofran in the event that medication makes her nauseous.  Education provided on importance of monitoring diet and exercise while taking medication for optimal control.

## 2022-02-02 ENCOUNTER — Other Ambulatory Visit (HOSPITAL_COMMUNITY): Payer: Self-pay

## 2022-02-04 ENCOUNTER — Telehealth (HOSPITAL_BASED_OUTPATIENT_CLINIC_OR_DEPARTMENT_OTHER): Payer: Self-pay | Admitting: Nurse Practitioner

## 2022-02-04 ENCOUNTER — Other Ambulatory Visit (HOSPITAL_COMMUNITY): Payer: Self-pay

## 2022-02-04 NOTE — Telephone Encounter (Signed)
Encounter made in error. 

## 2022-02-04 NOTE — Telephone Encounter (Signed)
Received fax transmission from pt's ins company denying coverage for OZEMPIC 0.25-0.5 MG/DOSE PEN. Documents will be in provider's yellow dot tray.

## 2022-02-06 ENCOUNTER — Other Ambulatory Visit (HOSPITAL_COMMUNITY): Payer: Self-pay

## 2022-02-09 ENCOUNTER — Other Ambulatory Visit (HOSPITAL_COMMUNITY): Payer: Self-pay

## 2022-02-09 ENCOUNTER — Encounter (HOSPITAL_BASED_OUTPATIENT_CLINIC_OR_DEPARTMENT_OTHER): Payer: Self-pay | Admitting: Nurse Practitioner

## 2022-02-10 ENCOUNTER — Other Ambulatory Visit (HOSPITAL_COMMUNITY): Payer: Self-pay

## 2022-02-15 ENCOUNTER — Encounter (HOSPITAL_BASED_OUTPATIENT_CLINIC_OR_DEPARTMENT_OTHER): Payer: Self-pay | Admitting: Nurse Practitioner

## 2022-02-16 NOTE — Telephone Encounter (Signed)
Pt called back and scheduled her for a telemed visit for 6/30.

## 2022-02-16 NOTE — Telephone Encounter (Signed)
LVM for pt to cb to sch- "discuss medication change on HCTZ back to 12.'5mg'$  and discontinue amlodipine/ discuss symptoms from ozempic." Also let pt know she can schedule threw mychart if she rather as well.

## 2022-02-27 ENCOUNTER — Other Ambulatory Visit (HOSPITAL_COMMUNITY): Payer: Self-pay

## 2022-02-27 ENCOUNTER — Ambulatory Visit (INDEPENDENT_AMBULATORY_CARE_PROVIDER_SITE_OTHER): Payer: 59 | Admitting: Nurse Practitioner

## 2022-02-27 DIAGNOSIS — Z6834 Body mass index (BMI) 34.0-34.9, adult: Secondary | ICD-10-CM

## 2022-02-27 DIAGNOSIS — E6609 Other obesity due to excess calories: Secondary | ICD-10-CM

## 2022-02-27 DIAGNOSIS — R7303 Prediabetes: Secondary | ICD-10-CM

## 2022-02-27 DIAGNOSIS — I10 Essential (primary) hypertension: Secondary | ICD-10-CM

## 2022-02-27 MED ORDER — PEN NEEDLES 32G X 4 MM MISC
Freq: Every day | 11 refills | Status: DC
  Filled 2022-02-27 (×2): qty 100, 90d supply, fill #0

## 2022-02-27 MED ORDER — HYDROCHLOROTHIAZIDE 25 MG PO TABS
25.0000 mg | ORAL_TABLET | Freq: Every day | ORAL | 3 refills | Status: DC
  Filled 2022-02-27: qty 90, 90d supply, fill #0
  Filled 2022-06-11: qty 90, 90d supply, fill #1
  Filled 2022-09-09: qty 90, 90d supply, fill #2

## 2022-02-27 MED ORDER — VALSARTAN 80 MG PO TABS
80.0000 mg | ORAL_TABLET | Freq: Every day | ORAL | 3 refills | Status: DC
  Filled 2022-02-27: qty 90, 90d supply, fill #0

## 2022-02-27 MED ORDER — SEMAGLUTIDE(0.25 OR 0.5MG/DOS) 2 MG/3ML ~~LOC~~ SOPN
0.2500 mg | PEN_INJECTOR | SUBCUTANEOUS | 3 refills | Status: DC
  Filled 2022-02-27: qty 3, 28d supply, fill #0
  Filled 2022-03-31: qty 3, 56d supply, fill #0

## 2022-02-27 NOTE — Assessment & Plan Note (Signed)
Currently on her third week of Ozempic.  She is experiencing significant positive reactions from the medication with approximate 3 pound weight loss each week.  She also reports that her appetite has decreased significantly and she is no longer experiencing the cravings she once did.  She has also noticed a decrease in her joint pain which is likely due to reduction in the inflammatory properties of the extra carbohydrates in her diet.  I am extremely pleased with the response that she has had to the medications.  At this point I do recommend that she remain on the 0.25 mg dose as going up would not provide her with much additional benefit in the weight loss category and may drop her blood sugars.  I have sent in a refill for 0.25 mg and she can continue to stay on this until she feels that the decreased appetite and/or weight loss is no longer effective with that dose.

## 2022-02-27 NOTE — Patient Instructions (Signed)
Let me know how you are feeling with the blood pressure medication change or if your blood pressures drop.

## 2022-02-27 NOTE — Assessment & Plan Note (Signed)
Chronic.  She has currently had lower blood pressure readings after starting Ozempic and experiencing weight loss.  She has cut her medication in half on her own and blood pressures remain in the 110's.  I feel it is completely reasonable at this point to change medication and remove the amlodipine as this does not appear to be necessary at this point.  We will continue with valsartan 80 mg and the hydrochlorothiazide at 25 mg.  Patient has been instructed to continue to monitor her blood pressure.  If her blood pressures continue to drop I recommend cutting the valsartan in half and letting me know.  It may be that we are able to completely taper her off of the valsartan with time.

## 2022-02-27 NOTE — Progress Notes (Signed)
Virtual Visit Encounter telephone visit.   I connected with  Sheila Nelson on 04/05/22 at  8:30 AM EDT by secure audio telemedicine application. I verified that I am speaking with the correct person using two identifiers.   I introduced myself as a Designer, jewellery with the practice. The limitations of evaluation and management by telemedicine discussed with the patient and the availability of in person appointments. The patient expressed verbal understanding and consent to proceed.  Participating parties in this visit include: Myself and patient  The patient is: Patient Location: Home I am: Provider Location: Office/Clinic Subjective:    CC and HPI: Sheila Nelson is a 61 y.o. year old female presenting for follow up of hypertension and weight management. Patient reports the following: Since starting Ozempic her blood pressure has dropped She has been monitoring her BP at home and it is running lower  She reports that she does have some mild lightheadedness every once in a while as her body gets used to lower blood pressures. She has started cutting her amlodipine-hctz-valsartan combo pill in 1/2 and blood pressures are still in 110's She would like to drop the amlodipine and cut her valsartan dose in 1/2, but continue on the HCTZ '25mg'$  dose for fluid if possible.   She is on her 3rd dose of ozempic and it is working very well for her.  She is losing about 3lbs a week and tells me her cravings and appetite have dropped "like a flip of a switch" She is currently paying out of pocket for the medication as insurance would not cover. She would like to stay on the 0.'25mg'$  dose, if possible, since she is having such a great response to the medication   Past medical history, Surgical history, Family history not pertinant except as noted below, Social history, Allergies, and medications have been entered into the medical record, reviewed, and corrections made.   Review of Systems:  All  review of systems negative except what is listed in the HPI  Objective:    Alert and oriented x 4 Speaking in clear sentences with no shortness of breath. No distress.  Impression and Recommendations:    Problem List Items Addressed This Visit     Pre-diabetes    Doing very well on Ozempic.  Having excellent weight loss and improved control of her hypertension.  Appetite is controlled.  Continue 0.25 mg dose as this is working well for her.  Plan to follow-up in 3 to 6 months for reevaluation.      Relevant Medications   valsartan (DIOVAN) 80 MG tablet   hydrochlorothiazide (HYDRODIURIL) 25 MG tablet   Insulin Pen Needle (PEN NEEDLES) 32G X 4 MM MISC   Semaglutide,0.25 or 0.'5MG'$ /DOS, 2 MG/3ML SOPN   Essential hypertension - Primary    Chronic.  She has currently had lower blood pressure readings after starting Ozempic and experiencing weight loss.  She has cut her medication in half on her own and blood pressures remain in the 110's.  I feel it is completely reasonable at this point to change medication and remove the amlodipine as this does not appear to be necessary at this point.  We will continue with valsartan 80 mg and the hydrochlorothiazide at 25 mg.  Patient has been instructed to continue to monitor her blood pressure.  If her blood pressures continue to drop I recommend cutting the valsartan in half and letting me know.  It may be that we are able to completely taper her off  of the valsartan with time.      Relevant Medications   valsartan (DIOVAN) 80 MG tablet   hydrochlorothiazide (HYDRODIURIL) 25 MG tablet   Semaglutide,0.25 or 0.'5MG'$ /DOS, 2 MG/3ML SOPN   Obesity    Currently on her third week of Ozempic.  She is experiencing significant positive reactions from the medication with approximate 3 pound weight loss each week.  She also reports that her appetite has decreased significantly and she is no longer experiencing the cravings she once did.  She has also noticed a  decrease in her joint pain which is likely due to reduction in the inflammatory properties of the extra carbohydrates in her diet.  I am extremely pleased with the response that she has had to the medications.  At this point I do recommend that she remain on the 0.25 mg dose as going up would not provide her with much additional benefit in the weight loss category and may drop her blood sugars.  I have sent in a refill for 0.25 mg and she can continue to stay on this until she feels that the decreased appetite and/or weight loss is no longer effective with that dose.      Relevant Medications   Semaglutide,0.25 or 0.'5MG'$ /DOS, 2 MG/3ML SOPN    orders and follow up as documented in EMR I discussed the assessment and treatment plan with the patient. The patient was provided an opportunity to ask questions and all were answered. The patient agreed with the plan and demonstrated an understanding of the instructions.   The patient was advised to call back or seek an in-person evaluation if the symptoms worsen or if the condition fails to improve as anticipated.  Follow-Up: scheduled for september  I provided 19 minutes of non-face-to-face interaction with this non face-to-face encounter including intake, same-day documentation, and chart review.   Orma Render, NP , DNP, AGNP-c [facility] [phone] (fax)

## 2022-03-04 ENCOUNTER — Telehealth: Payer: Self-pay | Admitting: Sports Medicine

## 2022-03-04 ENCOUNTER — Other Ambulatory Visit (HOSPITAL_COMMUNITY): Payer: Self-pay

## 2022-03-04 MED ORDER — MELOXICAM 15 MG PO TABS
15.0000 mg | ORAL_TABLET | Freq: Every day | ORAL | 0 refills | Status: DC
  Filled 2022-03-04: qty 20, 20d supply, fill #0

## 2022-03-04 NOTE — Telephone Encounter (Deleted)
Refill placed

## 2022-03-04 NOTE — Telephone Encounter (Signed)
LOV 10/07/21 --pt cld request Rx refill of :   Rx #: [id number]  meloxicam (MOBIC) 15 MG tablet [[id number]]   Order Details Dose: 15 mg Route: Oral Frequency: Daily  Dispense Quantity: 20 tablet Refills: 0 Fills remaining: 0       Sig: Take 1 tablet (15 mg total) by mouth daily with food as needed   Pt uses:  [facility]  [address], [place] [address]  Phone:  [phone]  Fax:  [phone]   -glh

## 2022-03-18 ENCOUNTER — Other Ambulatory Visit: Payer: Self-pay | Admitting: Nurse Practitioner

## 2022-03-18 DIAGNOSIS — Z1231 Encounter for screening mammogram for malignant neoplasm of breast: Secondary | ICD-10-CM

## 2022-03-31 ENCOUNTER — Other Ambulatory Visit (HOSPITAL_COMMUNITY): Payer: Self-pay

## 2022-04-03 ENCOUNTER — Ambulatory Visit
Admission: RE | Admit: 2022-04-03 | Discharge: 2022-04-03 | Disposition: A | Discharge status: Home / Self Care | Payer: 59 | Source: Ambulatory Visit | Attending: Nurse Practitioner | Admitting: Nurse Practitioner

## 2022-04-03 DIAGNOSIS — Z1231 Encounter for screening mammogram for malignant neoplasm of breast: Secondary | ICD-10-CM | POA: Diagnosis not present

## 2022-04-05 ENCOUNTER — Encounter (HOSPITAL_BASED_OUTPATIENT_CLINIC_OR_DEPARTMENT_OTHER): Payer: Self-pay | Admitting: Nurse Practitioner

## 2022-04-05 NOTE — Assessment & Plan Note (Signed)
Doing very well on Ozempic.  Having excellent weight loss and improved control of her hypertension.  Appetite is controlled.  Continue 0.25 mg dose as this is working well for her.  Plan to follow-up in 3 to 6 months for reevaluation.

## 2022-04-07 ENCOUNTER — Encounter (HOSPITAL_BASED_OUTPATIENT_CLINIC_OR_DEPARTMENT_OTHER): Payer: Self-pay | Admitting: Nurse Practitioner

## 2022-04-07 ENCOUNTER — Other Ambulatory Visit: Payer: Self-pay | Admitting: Nurse Practitioner

## 2022-04-07 DIAGNOSIS — R928 Other abnormal and inconclusive findings on diagnostic imaging of breast: Secondary | ICD-10-CM

## 2022-04-14 ENCOUNTER — Ambulatory Visit
Admission: RE | Admit: 2022-04-14 | Discharge: 2022-04-14 | Disposition: A | Discharge status: Home / Self Care | Payer: 59 | Source: Ambulatory Visit | Attending: Nurse Practitioner | Admitting: Nurse Practitioner

## 2022-04-14 ENCOUNTER — Other Ambulatory Visit: Payer: Self-pay | Admitting: Nurse Practitioner

## 2022-04-14 DIAGNOSIS — N6489 Other specified disorders of breast: Secondary | ICD-10-CM

## 2022-04-14 DIAGNOSIS — R928 Other abnormal and inconclusive findings on diagnostic imaging of breast: Secondary | ICD-10-CM

## 2022-04-17 ENCOUNTER — Encounter: Payer: Self-pay | Admitting: Internal Medicine

## 2022-04-20 ENCOUNTER — Other Ambulatory Visit: Payer: Self-pay | Admitting: Nurse Practitioner

## 2022-04-20 DIAGNOSIS — N6489 Other specified disorders of breast: Secondary | ICD-10-CM

## 2022-04-30 ENCOUNTER — Ambulatory Visit
Admission: RE | Admit: 2022-04-30 | Discharge: 2022-04-30 | Disposition: A | Discharge status: Home / Self Care | Payer: 59 | Source: Ambulatory Visit | Attending: Nurse Practitioner | Admitting: Nurse Practitioner

## 2022-04-30 DIAGNOSIS — R928 Other abnormal and inconclusive findings on diagnostic imaging of breast: Secondary | ICD-10-CM | POA: Diagnosis not present

## 2022-04-30 DIAGNOSIS — N6489 Other specified disorders of breast: Secondary | ICD-10-CM

## 2022-05-05 ENCOUNTER — Other Ambulatory Visit (HOSPITAL_BASED_OUTPATIENT_CLINIC_OR_DEPARTMENT_OTHER): Payer: Self-pay

## 2022-05-05 ENCOUNTER — Other Ambulatory Visit (HOSPITAL_COMMUNITY): Payer: Self-pay

## 2022-05-05 ENCOUNTER — Encounter (HOSPITAL_BASED_OUTPATIENT_CLINIC_OR_DEPARTMENT_OTHER): Payer: Self-pay | Admitting: Nurse Practitioner

## 2022-05-05 ENCOUNTER — Ambulatory Visit (HOSPITAL_BASED_OUTPATIENT_CLINIC_OR_DEPARTMENT_OTHER): Payer: 59 | Admitting: Nurse Practitioner

## 2022-05-05 VITALS — BP 109/72 | HR 72 | Ht 64.0 in | Wt 193.4 lb

## 2022-05-05 DIAGNOSIS — I10 Essential (primary) hypertension: Secondary | ICD-10-CM | POA: Diagnosis not present

## 2022-05-05 DIAGNOSIS — Z8249 Family history of ischemic heart disease and other diseases of the circulatory system: Secondary | ICD-10-CM | POA: Diagnosis not present

## 2022-05-05 DIAGNOSIS — Z8241 Family history of sudden cardiac death: Secondary | ICD-10-CM | POA: Diagnosis not present

## 2022-05-05 DIAGNOSIS — R7303 Prediabetes: Secondary | ICD-10-CM | POA: Diagnosis not present

## 2022-05-05 DIAGNOSIS — R0989 Other specified symptoms and signs involving the circulatory and respiratory systems: Secondary | ICD-10-CM | POA: Diagnosis not present

## 2022-05-05 DIAGNOSIS — Z6834 Body mass index (BMI) 34.0-34.9, adult: Secondary | ICD-10-CM | POA: Diagnosis not present

## 2022-05-05 DIAGNOSIS — E6609 Other obesity due to excess calories: Secondary | ICD-10-CM

## 2022-05-05 MED ORDER — SEMAGLUTIDE(0.25 OR 0.5MG/DOS) 2 MG/3ML ~~LOC~~ SOPN
0.5000 mg | PEN_INJECTOR | SUBCUTANEOUS | 3 refills | Status: DC
  Filled 2022-05-05 – 2022-05-18 (×3): qty 3, 28d supply, fill #0

## 2022-05-05 MED ORDER — VALSARTAN 40 MG PO TABS
40.0000 mg | ORAL_TABLET | Freq: Every day | ORAL | 3 refills | Status: DC
  Filled 2022-05-05 – 2022-05-14 (×2): qty 90, 90d supply, fill #0
  Filled 2022-11-04: qty 90, 90d supply, fill #1

## 2022-05-05 MED ORDER — WEGOVY 0.5 MG/0.5ML ~~LOC~~ SOAJ
0.5000 mg | SUBCUTANEOUS | 3 refills | Status: DC
  Filled 2022-05-05: qty 2, 28d supply, fill #0

## 2022-05-05 NOTE — Assessment & Plan Note (Signed)
Etiology unknown. Given AAA in both brothers and bruit on examination today recommend close monitoring. BP under good control

## 2022-05-05 NOTE — Patient Instructions (Signed)
I have sent in the referral for the [place] for the abdomen to [facility]. If you have not heard anything in the next couple of days please let me know.   I sent in the refill on the Ozempic/Wegovy. 0.'5mg'$    I have sent in the '40mg'$  of Valsartan for you.   If you have any questions or concerns do not hesitate to reach out.

## 2022-05-05 NOTE — Assessment & Plan Note (Signed)
Chronic. Excellent efforts with diet, exercise, and medication with Wegovy. She looks fantastic today. Her BP is much improved. We will plan to continue current regimen with increased dose of semaglutide at 0.'5mg'$ . At this time recommend avoidance of strenuous exercise until evaluation of abdomen with [place].

## 2022-05-05 NOTE — Assessment & Plan Note (Signed)
Chronic. Currently working on [employer] and diet control with use of Wegovy. She has increased her dose to 0.'5mg'$  and is tolerating well. I am very pleased with the progress she is making. Recommend continuation of her current diet regimen and medication. Recommend avoidance of strenuous activity until US examination of abdomen due to bruit detected.  Will send in orders for both Wegovy and Ozempic. Approval for Wegovy at this time, but due to shortage in supply she has been paying cash for Ozempic in order to continue. If unable to get [facility], recommend this. Note to pharmacy to clarify.

## 2022-05-05 NOTE — Assessment & Plan Note (Signed)
Both brothers. Evaluation [place] ordered.

## 2022-05-05 NOTE — Assessment & Plan Note (Signed)
Chronic. Very well controlled today. Continue HCTZ '25mg'$  and Valsartan '40mg'$  daily. I have sent in refills of the Valsartan at the new dose for 1 year.  Recommend very tight BP control in the setting of suspected bruit in abdomen. No alarm sx present at this time.  Referral sent for [place].

## 2022-05-05 NOTE — Assessment & Plan Note (Signed)
Soft blowing sound auscultated over epigastric region. She reports historically she was told she had a bruit present in the lower abdomen. No previous imaging. She does have a family history of AAA in both brothers and sudden cardiac death in sister, which is very concerning for familial predisposition. Will send order for [place] today and monitor. Recommend strict BP management and avoidance of activities that would cause strain to vascular system. She is aware of emergency protocol for immediate evaluation.

## 2022-05-05 NOTE — Progress Notes (Signed)
Sheila Keeler, DNP, AGNP-c [place] [address] [place], [place] [address] [phone] Office [phone] Fax  ESTABLISHED PATIENT- Chronic Health and/or Follow-Up Visit  Vitals:   05/05/22 0823  BP: 109/72  Pulse: 72  Height: '5\' 4"'$  (1.626 m)  Weight: 193 lb 6.4 oz (87.7 kg)  SpO2: 100%  BMI (Calculated): 33.18     '@NAMEBYAGE'$ @ is a 61 y.o. year old female presenting today for evaluation and management of the following: Follow-up (Pt here for f/u for weight management she stated everything is going well )   BRIEF HISTORY, IMPRESSION, RECOMMENDATIONS, and ROS  1. Essential hypertension Sheila Nelson reports her BP is very well controlled. She is currently taking HCTZ '25mg'$  and Valsartan '40mg'$  daily with no side effects. She is following a heart healthy diet. She is working on [employer]. She denies CP, ShOB, HA, dizziness, LE edema.   2. Class 1 obesity due to excess calories with serious comorbidity and body mass index (BMI) of 34.0 to 34.9 in adult 3. Pre-diabetes Sheila Nelson has increased Wegovy dose to 0.'5mg'$  weekly as she did notice she started craving sweets. She tells me she has not seen much as far as weight loss, but she feels great and her clothes are fitting better, which tells her the medication is working. She reports occasional heartburn, but nothing that isn't controlled by tums. She also reports that she will occasionally have constipation, but this is controlled with magnesium supplement at bedtime.  Her last Wegovy she was unable to get from the pharmacy due to shortage, therefore she has been using the [employer] and purchasing with cash. She would like to stay on the medication at this time.   4. Abdominal bruit 5. Family history of abdominal aortic aneurysm (AAA) 6. Family history of sudden cardiac death in sister Sheila Nelson has concerns about a bulge in her upper abdomen and previous bruit detected on examination  of the abdomen. She tells me that both of her brothers have a history of AAA and her sister had sudden cardiac death of unknown etiology at 59 years of age. With the presence of the bulge and changes in the way this looks and feels she does have concern for possible AAA in herself. She did have an [place] about a year and a half ago, but it did not visualize the abdominal aorta. She does have a history of vascular complications in her lower extremities.  She has not had any shortness of breath, syncope, dizziness, or palpitations.      All ROS negative with exception of what is listed above.   PHYSICAL EXAM Physical Exam Vitals and nursing note reviewed.  Constitutional:      Appearance: Normal appearance.  Eyes:     Extraocular Movements: Extraocular movements intact.     Pupils: Pupils are equal, round, and reactive to light.  Neck:     Vascular: No carotid bruit.  Cardiovascular:     Rate and Rhythm: Normal rate and regular rhythm.     Pulses: Normal pulses.     Heart sounds: Normal heart sounds.  Pulmonary:     Effort: Pulmonary effort is normal.     Breath sounds: Normal breath sounds.  Abdominal:     General: Bowel sounds are normal. There is abdominal bruit.     Palpations: Abdomen is soft.    Musculoskeletal:        General: Normal range of motion.     Cervical back: Normal range of  motion.     Right lower leg: No edema.     Left lower leg: No edema.  Lymphadenopathy:     Cervical: No cervical adenopathy.  Skin:    General: Skin is warm and dry.     Capillary Refill: Capillary refill takes less than 2 seconds.  Neurological:     General: No focal deficit present.     Mental Status: She is alert and oriented to person, place, and time.     Motor: No weakness.  Psychiatric:        Mood and Affect: Mood normal.        Behavior: Behavior normal.        Thought Content: Thought content normal.        Judgment: Judgment normal.     PLAN Problem List Items Addressed  This Visit     Essential hypertension    Chronic. Very well controlled today. Continue HCTZ '25mg'$  and Valsartan '40mg'$  daily. I have sent in refills of the Valsartan at the new dose for 1 year.  Recommend very tight BP control in the setting of suspected bruit in abdomen. No alarm sx present at this time.  Referral sent for [place].       Relevant Medications   Semaglutide,0.25 or 0.'5MG'$ /DOS, 2 MG/3ML SOPN   valsartan (DIOVAN) 40 MG tablet   Semaglutide-Weight Management (WEGOVY) 0.5 MG/0.5ML SOAJ   Obesity    Chronic. Excellent efforts with diet, exercise, and medication with Wegovy. She looks fantastic today. Her BP is much improved. We will plan to continue current regimen with increased dose of semaglutide at 0.'5mg'$ . At this time recommend avoidance of strenuous exercise until evaluation of abdomen with [place].       Relevant Medications   Semaglutide,0.25 or 0.'5MG'$ /DOS, 2 MG/3ML SOPN   valsartan (DIOVAN) 40 MG tablet   Semaglutide-Weight Management (WEGOVY) 0.5 MG/0.5ML SOAJ   Pre-diabetes    Chronic. Currently working on [employer] and diet control with use of Wegovy. She has increased her dose to 0.'5mg'$  and is tolerating well. I am very pleased with the progress she is making. Recommend continuation of her current diet regimen and medication. Recommend avoidance of strenuous activity until US examination of abdomen due to bruit detected.  Will send in orders for both Wegovy and Ozempic. Approval for Wegovy at this time, but due to shortage in supply she has been paying cash for Ozempic in order to continue. If unable to get [facility], recommend this. Note to pharmacy to clarify.       Relevant Medications   Semaglutide,0.25 or 0.'5MG'$ /DOS, 2 MG/3ML SOPN   valsartan (DIOVAN) 40 MG tablet   Semaglutide-Weight Management (WEGOVY) 0.5 MG/0.5ML SOAJ   Abdominal bruit - Primary    Soft blowing sound auscultated over epigastric region. She reports historically she was told she had a bruit present in  the lower abdomen. No previous imaging. She does have a family history of AAA in both brothers and sudden cardiac death in sister, which is very concerning for familial predisposition. Will send order for [place] today and monitor. Recommend strict BP management and avoidance of activities that would cause strain to vascular system. She is aware of emergency protocol for immediate evaluation.       Relevant Orders   VAS [place] AAA DUPLEX   Family history of abdominal aortic aneurysm (AAA)    Both brothers. Evaluation [place] ordered.       Relevant Orders   VAS [place] AAA DUPLEX   Family  history of sudden cardiac death in sister    Etiology unknown. Given AAA in both brothers and bruit on examination today recommend close monitoring. BP under good control       Relevant Orders   VAS [place] AAA DUPLEX      FOLLOW-UP Return in about 6 months (around 11/03/2022) for Weight.   Sheila Keeler, DNP, AGNP-c 05/05/2022  8:24 AM

## 2022-05-07 ENCOUNTER — Other Ambulatory Visit (HOSPITAL_BASED_OUTPATIENT_CLINIC_OR_DEPARTMENT_OTHER): Payer: Self-pay

## 2022-05-08 ENCOUNTER — Other Ambulatory Visit (HOSPITAL_BASED_OUTPATIENT_CLINIC_OR_DEPARTMENT_OTHER): Payer: Self-pay

## 2022-05-11 ENCOUNTER — Other Ambulatory Visit (HOSPITAL_BASED_OUTPATIENT_CLINIC_OR_DEPARTMENT_OTHER): Payer: Self-pay

## 2022-05-13 ENCOUNTER — Other Ambulatory Visit (HOSPITAL_COMMUNITY): Payer: Self-pay

## 2022-05-14 ENCOUNTER — Other Ambulatory Visit (HOSPITAL_COMMUNITY): Payer: Self-pay

## 2022-05-14 ENCOUNTER — Other Ambulatory Visit (HOSPITAL_BASED_OUTPATIENT_CLINIC_OR_DEPARTMENT_OTHER): Payer: Self-pay

## 2022-05-15 ENCOUNTER — Ambulatory Visit (HOSPITAL_COMMUNITY)
Admission: RE | Admit: 2022-05-15 | Discharge: 2022-05-15 | Disposition: A | Discharge status: Home / Self Care | Payer: 59 | Source: Ambulatory Visit | Attending: Internal Medicine | Admitting: Internal Medicine

## 2022-05-15 DIAGNOSIS — Z8249 Family history of ischemic heart disease and other diseases of the circulatory system: Secondary | ICD-10-CM | POA: Insufficient documentation

## 2022-05-15 DIAGNOSIS — Z8241 Family history of sudden cardiac death: Secondary | ICD-10-CM | POA: Insufficient documentation

## 2022-05-15 DIAGNOSIS — R0989 Other specified symptoms and signs involving the circulatory and respiratory systems: Secondary | ICD-10-CM | POA: Diagnosis not present

## 2022-05-18 ENCOUNTER — Other Ambulatory Visit (HOSPITAL_COMMUNITY): Payer: Self-pay

## 2022-05-20 ENCOUNTER — Other Ambulatory Visit (HOSPITAL_BASED_OUTPATIENT_CLINIC_OR_DEPARTMENT_OTHER): Payer: Self-pay

## 2022-05-22 ENCOUNTER — Other Ambulatory Visit (HOSPITAL_COMMUNITY): Payer: Self-pay

## 2022-06-08 ENCOUNTER — Encounter (HOSPITAL_BASED_OUTPATIENT_CLINIC_OR_DEPARTMENT_OTHER): Payer: Self-pay | Admitting: Nurse Practitioner

## 2022-06-08 ENCOUNTER — Other Ambulatory Visit (HOSPITAL_BASED_OUTPATIENT_CLINIC_OR_DEPARTMENT_OTHER): Payer: Self-pay

## 2022-06-08 ENCOUNTER — Other Ambulatory Visit (HOSPITAL_COMMUNITY): Payer: Self-pay

## 2022-06-09 ENCOUNTER — Other Ambulatory Visit (HOSPITAL_BASED_OUTPATIENT_CLINIC_OR_DEPARTMENT_OTHER): Payer: Self-pay

## 2022-06-09 ENCOUNTER — Other Ambulatory Visit (HOSPITAL_COMMUNITY): Payer: Self-pay

## 2022-06-09 DIAGNOSIS — E6609 Other obesity due to excess calories: Secondary | ICD-10-CM

## 2022-06-09 DIAGNOSIS — R7303 Prediabetes: Secondary | ICD-10-CM

## 2022-06-09 DIAGNOSIS — I10 Essential (primary) hypertension: Secondary | ICD-10-CM

## 2022-06-09 MED ORDER — WEGOVY 0.5 MG/0.5ML ~~LOC~~ SOAJ
1.0000 mg | SUBCUTANEOUS | 3 refills | Status: DC
  Filled 2022-06-09 – 2022-06-10 (×2): qty 2, 28d supply, fill #0

## 2022-06-10 ENCOUNTER — Other Ambulatory Visit (HOSPITAL_BASED_OUTPATIENT_CLINIC_OR_DEPARTMENT_OTHER): Payer: Self-pay | Admitting: Nurse Practitioner

## 2022-06-10 ENCOUNTER — Other Ambulatory Visit (HOSPITAL_COMMUNITY): Payer: Self-pay

## 2022-06-10 DIAGNOSIS — I10 Essential (primary) hypertension: Secondary | ICD-10-CM

## 2022-06-10 DIAGNOSIS — E6609 Other obesity due to excess calories: Secondary | ICD-10-CM

## 2022-06-10 DIAGNOSIS — R7303 Prediabetes: Secondary | ICD-10-CM

## 2022-06-10 MED ORDER — WEGOVY 1 MG/0.5ML ~~LOC~~ SOAJ
1.0000 mg | SUBCUTANEOUS | 3 refills | Status: DC
  Filled 2022-06-10: qty 2, 28d supply, fill #0
  Filled 2022-07-12: qty 2, 28d supply, fill #1
  Filled 2022-08-05: qty 2, 28d supply, fill #2
  Filled 2022-09-09: qty 2, 28d supply, fill #3

## 2022-06-11 ENCOUNTER — Other Ambulatory Visit (HOSPITAL_COMMUNITY): Payer: Self-pay

## 2022-06-12 ENCOUNTER — Other Ambulatory Visit (HOSPITAL_COMMUNITY): Payer: Self-pay

## 2022-06-25 ENCOUNTER — Ambulatory Visit: Payer: 59

## 2022-06-29 ENCOUNTER — Other Ambulatory Visit (HOSPITAL_COMMUNITY): Payer: Self-pay

## 2022-07-13 ENCOUNTER — Other Ambulatory Visit (HOSPITAL_COMMUNITY): Payer: Self-pay

## 2022-08-05 ENCOUNTER — Other Ambulatory Visit (HOSPITAL_COMMUNITY): Payer: Self-pay

## 2022-09-08 ENCOUNTER — Encounter: Payer: Self-pay | Admitting: Internal Medicine

## 2022-09-11 ENCOUNTER — Other Ambulatory Visit (HOSPITAL_COMMUNITY): Payer: Self-pay

## 2022-09-14 ENCOUNTER — Encounter: Payer: Self-pay | Admitting: Nurse Practitioner

## 2022-09-14 ENCOUNTER — Other Ambulatory Visit (HOSPITAL_COMMUNITY): Payer: Self-pay

## 2022-09-14 DIAGNOSIS — E6609 Other obesity due to excess calories: Secondary | ICD-10-CM

## 2022-09-14 MED ORDER — WEGOVY 1.7 MG/0.75ML ~~LOC~~ SOAJ
1.7000 mg | SUBCUTANEOUS | 3 refills | Status: DC
  Filled 2022-09-14: qty 3, 28d supply, fill #0

## 2022-09-15 ENCOUNTER — Other Ambulatory Visit: Payer: Self-pay

## 2022-09-15 ENCOUNTER — Other Ambulatory Visit (HOSPITAL_COMMUNITY): Payer: Self-pay

## 2022-09-21 ENCOUNTER — Other Ambulatory Visit: Payer: Self-pay | Admitting: Nurse Practitioner

## 2022-09-21 DIAGNOSIS — R928 Other abnormal and inconclusive findings on diagnostic imaging of breast: Secondary | ICD-10-CM

## 2022-10-06 ENCOUNTER — Encounter: Payer: Self-pay | Admitting: Nurse Practitioner

## 2022-10-06 ENCOUNTER — Ambulatory Visit: Payer: 59 | Admitting: Nurse Practitioner

## 2022-10-06 ENCOUNTER — Other Ambulatory Visit (HOSPITAL_COMMUNITY): Payer: Self-pay

## 2022-10-06 VITALS — BP 100/72 | HR 68 | Ht 64.0 in | Wt 186.4 lb

## 2022-10-06 DIAGNOSIS — Z1211 Encounter for screening for malignant neoplasm of colon: Secondary | ICD-10-CM | POA: Diagnosis not present

## 2022-10-06 DIAGNOSIS — E6609 Other obesity due to excess calories: Secondary | ICD-10-CM

## 2022-10-06 DIAGNOSIS — R7303 Prediabetes: Secondary | ICD-10-CM

## 2022-10-06 DIAGNOSIS — Z23 Encounter for immunization: Secondary | ICD-10-CM | POA: Diagnosis not present

## 2022-10-06 DIAGNOSIS — I10 Essential (primary) hypertension: Secondary | ICD-10-CM

## 2022-10-06 DIAGNOSIS — G8929 Other chronic pain: Secondary | ICD-10-CM

## 2022-10-06 DIAGNOSIS — E7800 Pure hypercholesterolemia, unspecified: Secondary | ICD-10-CM | POA: Diagnosis not present

## 2022-10-06 DIAGNOSIS — Z6831 Body mass index (BMI) 31.0-31.9, adult: Secondary | ICD-10-CM

## 2022-10-06 DIAGNOSIS — M25571 Pain in right ankle and joints of right foot: Secondary | ICD-10-CM | POA: Diagnosis not present

## 2022-10-06 MED ORDER — HYDROCHLOROTHIAZIDE 25 MG PO TABS
25.0000 mg | ORAL_TABLET | Freq: Every day | ORAL | 3 refills | Status: DC
  Filled 2022-10-06 – 2023-01-08 (×2): qty 90, 90d supply, fill #0

## 2022-10-06 MED ORDER — MELOXICAM 15 MG PO TABS
15.0000 mg | ORAL_TABLET | Freq: Every day | ORAL | 6 refills | Status: DC
  Filled 2022-10-06: qty 30, 30d supply, fill #0
  Filled 2023-01-31: qty 30, 30d supply, fill #1

## 2022-10-06 MED ORDER — WEGOVY 1.7 MG/0.75ML ~~LOC~~ SOAJ
1.7000 mg | SUBCUTANEOUS | 3 refills | Status: DC
  Filled 2022-10-06: qty 9, 84d supply, fill #0
  Filled 2022-12-14: qty 3, 28d supply, fill #1

## 2022-10-06 NOTE — Progress Notes (Signed)
  [name], DNP, AGNP-c [place]  [address] [place], [place] [address] [phone]  ESTABLISHED PATIENT- Chronic Health and/or Follow-Up Visit  Blood pressure 100/72, pulse 68, height '5\' 4"'$  (1.626 m), weight 186 lb 6.4 oz (84.6 kg), last menstrual period 09/19/2011, SpO2 98 %.    Sheila Nelson is a 62 y.o. year old female presenting today for evaluation and management of the following: Weight She is down about 20lbs since starting the medication. She tells me that she is feeling great on the medication and she is losing weight at the slow and steady pace that she would like to see.   HTN She tried to cut the valsartan back to '20mg'$ , but she had a high sodium meal and  All ROS negative with exception of what is listed above.   She has an old ankle injury on the right ankle that flairs every now and then  Can she get lung cancer screening with second hand exposure multiple people in the household smoking from birth to 58.  PHYSICAL EXAM Physical Exam  PLAN Problem List Items Addressed This Visit     Essential hypertension   Relevant Medications   hydrochlorothiazide (HYDRODIURIL) 25 MG tablet   Other Relevant Orders   CT CARDIAC SCORING (SELF PAY ONLY)   Obesity   Relevant Medications   Semaglutide-Weight Management (WEGOVY) 1.7 MG/0.75ML SOAJ   Other Relevant Orders   CT CARDIAC SCORING (SELF PAY ONLY)   Pre-diabetes - Primary   Relevant Medications   hydrochlorothiazide (HYDRODIURIL) 25 MG tablet   Other Relevant Orders   CT CARDIAC SCORING (SELF PAY ONLY)   Other Visit Diagnoses     Need for COVID-19 vaccine       Relevant Orders   Pfizer Fall 2023 Covid-19 Vaccine 67yr and older (Completed)   Screening for colon cancer       Relevant Orders   Cologuard   Chronic pain of right ankle       Relevant Medications   meloxicam (MOBIC) 15 MG tablet   Pure hypercholesterolemia       Relevant Medications   hydrochlorothiazide  (HYDRODIURIL) 25 MG tablet   Other Relevant Orders   CT CARDIAC SCORING (SELF PAY ONLY)       Return for late June for CPE/Pap/Lab.   SWorthy Keeler DNP, AGNP-c 10/06/2022  8:23 AM

## 2022-10-06 NOTE — Patient Instructions (Signed)
I have sent in the cardiac calcium CT order, you can wait to talk to Dr. Joya Gaskins before scheduling that. Let me know if he has any suggestions.   We will plan on your physical and pap in the summer- we will get labs then.   I have send the cologuard order in- they will mail this to you.   I sent refills. Let me know if you are able to back off of the BP medications.

## 2022-10-09 ENCOUNTER — Other Ambulatory Visit (HOSPITAL_COMMUNITY): Payer: Self-pay

## 2022-10-12 ENCOUNTER — Other Ambulatory Visit: Payer: Self-pay

## 2022-10-12 ENCOUNTER — Ambulatory Visit: Payer: Commercial Managed Care - PPO | Admitting: Family

## 2022-10-16 ENCOUNTER — Telehealth: Payer: Self-pay | Admitting: Nurse Practitioner

## 2022-10-16 NOTE — Telephone Encounter (Signed)
CT Appointment

## 2022-10-29 ENCOUNTER — Inpatient Hospital Stay (HOSPITAL_COMMUNITY): Admission: RE | Admit: 2022-10-29 | Payer: Self-pay | Source: Ambulatory Visit

## 2022-10-31 DIAGNOSIS — Z1211 Encounter for screening for malignant neoplasm of colon: Secondary | ICD-10-CM | POA: Diagnosis not present

## 2022-11-03 ENCOUNTER — Ambulatory Visit (INDEPENDENT_AMBULATORY_CARE_PROVIDER_SITE_OTHER): Payer: 59 | Admitting: Sports Medicine

## 2022-11-03 ENCOUNTER — Ambulatory Visit
Admission: RE | Admit: 2022-11-03 | Discharge: 2022-11-03 | Disposition: A | Discharge status: Home / Self Care | Payer: 59 | Source: Ambulatory Visit | Attending: Sports Medicine | Admitting: Sports Medicine

## 2022-11-03 VITALS — BP 116/80 | Ht 64.0 in | Wt 179.0 lb

## 2022-11-03 DIAGNOSIS — M79621 Pain in right upper arm: Secondary | ICD-10-CM | POA: Diagnosis not present

## 2022-11-03 DIAGNOSIS — M25511 Pain in right shoulder: Secondary | ICD-10-CM

## 2022-11-03 DIAGNOSIS — M19011 Primary osteoarthritis, right shoulder: Secondary | ICD-10-CM | POA: Diagnosis not present

## 2022-11-03 NOTE — Progress Notes (Cosign Needed)
PCP: Orma Render, NP  Subjective:   HPI: Patient is a 62 y.o. female here for right shoulder injury.  Patient sustained a fall 2 days ago while hiking, tripped over a rock and landing on outstretched right hand. Since then she has had right anterior shoulder pain which has been getting worse. Took meloxicam the night of the injury. She has had difficulty with everyday tasks such as putting on a seatbelt, opening a door, reaching overhead due to the pain. Denies any wrist pain or elbow pain.  Does have some soreness in the bicep and tricep region.  Past Medical History:  Diagnosis Date   Allergy    Arthritis    feet and ankles   ASTHMA UNSPECIFIED WITH EXACERBATION    Borderline diabetes    Chest pain    a. 09/2015 felt to be non cardiac after LHC with no CAD    Fasciculation 08/04/2017   Gallstones    GERD (gastroesophageal reflux disease)    H/O varicella    Herpes    HTN (hypertension)    Hx of viral meningitis    Obesity    Pancreatitis    Viral   PLANTAR FASCIITIS, RIGHT 09/30/2010   PVC's (premature ventricular contractions)    REACTIVE AIRWAY DISEASE 10/09/2009   TINNITUS 10/09/2009    Current Outpatient Medications on File Prior to Visit  Medication Sig Dispense Refill   estradiol (ESTRACE) 0.1 MG/GM vaginal cream Place 1 g vaginally 2 (two) times a week. 42.5 g 12   fluticasone (FLONASE) 50 MCG/ACT nasal spray Place 2 sprays into both nostrils daily. (Patient not taking: Reported on 10/06/2022) 16 g 6   hydrochlorothiazide (HYDRODIURIL) 25 MG tablet Take 1 tablet (25 mg total) by mouth daily. 90 tablet 3   meloxicam (MOBIC) 15 MG tablet Take 1 tablet (15 mg total) by mouth daily. 30 tablet 6   MULTIPLE VITAMIN PO Take 1 tablet by mouth daily.     Semaglutide-Weight Management (WEGOVY) 1.7 MG/0.75ML SOAJ Inject 1.7 mg into the skin once a week. 9 mL 3   valACYclovir (VALTREX) 1000 MG tablet Take 1 tablet (1,000 mg total) by mouth daily. 90 tablet 4   valsartan (DIOVAN)  40 MG tablet Take 1 tablet (40 mg total) by mouth daily. 90 tablet 3   [DISCONTINUED] beclomethasone (QVAR) 80 MCG/ACT inhaler Inhale 2 puffs into the lungs 2 (two) times daily. 1 Inhaler 0   No current facility-administered medications on file prior to visit.    Past Surgical History:  Procedure Laterality Date   APPENDECTOMY  1992   CARDIAC CATHETERIZATION N/A 09/17/2015   Procedure: Left Heart Cath and Coronary Angiography;  Surgeon: Burnell Blanks, MD;  Location: [facility];  Service: Cardiovascular;  Laterality: N/A;   CESAREAN SECTION  06/23/2012   Procedure: CESAREAN SECTION;  Surgeon: Lovenia Kim, MD;  Location: [facility];  Service: Obstetrics;  Laterality: N/A;   CHOLECYSTECTOMY N/A 10/05/2016   Procedure: LAPAROSCOPIC CHOLECYSTECTOMY;  Surgeon: Fanny Skates, MD;  Location: [facility];  Service: General;  Laterality: N/A;   DILATION AND CURETTAGE OF UTERUS  2001   INGUINAL HERNIA REPAIR     Infant, bilateral   MOUTH SURGERY     Right thigh surgery  1973   Trauma    No Known Allergies  Social History   Socioeconomic History   Marital status: Married    Spouse name: Shean   Number of children: 1   Years of education: 23  Highest education level: Bachelor's degree (e.g., BA, AB, BS)  Occupational History   Occupation: Programmer, multimedia: [employer]  Tobacco Use   Smoking status: Never   Smokeless tobacco: Never   Tobacco comments:    Lives with spouse s/p wedding 07/2010. Employed as RN-pediatric critical care; now [employer]  Vaping Use   Vaping Use: Never used  Substance and Sexual Activity   Alcohol use: Yes    Comment: 3-5 glasses of wine per week   Drug use: No   Sexual activity: Yes    Partners: Male  Other Topics Concern   Not on file  Social History Narrative   Lives with partner and son.   1-2 cups caffeine per day.   Right-handed.   Social Determinants of Health   Financial Resource Strain: Not on file  Food Insecurity: Not on file   Transportation Needs: Not on file  Physical Activity: Not on file  Stress: Not on file  Social Connections: Not on file  Intimate Partner Violence: Not on file    Family History  Problem Relation Age of Onset   Prostate cancer Father    Stroke Father 77   Heart failure Father    Hypertension Father    Cancer Father        prostate   Valvular heart disease Mother        MVR   Thrombocytopenia Mother    Heart disease Mother    Birth defects Sister        tetralogy of falot   Hypertension Brother    Prostate cancer Brother    Stroke Maternal Grandmother    Hypertension Brother    Hypertension Brother    Colon cancer Neg Hx    Esophageal cancer Neg Hx    Stomach cancer Neg Hx    Rectal cancer Neg Hx     BP 116/80   Ht '5\' 4"'$  (1.626 m)   Wt 179 lb (81.2 kg)   LMP 09/19/2011   BMI 30.73 kg/m   Review of Systems: See HPI above.     Objective:  Physical Exam:  Gen: awake, alert, NAD, comfortable in exam room Pulm: breathing unlabored  Right shoulder: No obvious swelling.  There is tenderness to the anterior aspect of the glenohumeral joint.  Significantly limited ROM with abduction, flexion, extension and internal rotation secondary to pain.  Empty can test causes pain but no significant weakness noted.  Full strength with resisted internal rotation and external rotation.  Right elbow: No tenderness.  Full flexion and extension without pain.  Right wrist/hand: Full ROM with flexion and extension of the wrist without pain.  No snuffbox tenderness.  2+ radial pulses.   Assessment & Plan:  1.  Acute right shoulder pain Following [place] injury 2 days ago.  Fortunately no significant pain in the hand or wrist.  Will check plain films to assess for fracture; rotator cuff injury is also on the differential given significant limitation in ROM.  Low suspicion for dislocation. - DG right shoulder + humerus - placed in sling, will have her do the pendulum exercises - continue  meloxicam prn - follow-up in 1 week, consider ultrasound at follow-up  Zola Button, MD [place], PGY-3

## 2022-11-04 ENCOUNTER — Encounter: Payer: Self-pay | Admitting: Sports Medicine

## 2022-11-04 ENCOUNTER — Ambulatory Visit
Admission: RE | Admit: 2022-11-04 | Discharge: 2022-11-04 | Disposition: A | Discharge status: Home / Self Care | Payer: 59 | Source: Ambulatory Visit | Attending: Nurse Practitioner | Admitting: Nurse Practitioner

## 2022-11-04 DIAGNOSIS — R928 Other abnormal and inconclusive findings on diagnostic imaging of breast: Secondary | ICD-10-CM | POA: Diagnosis not present

## 2022-11-06 ENCOUNTER — Other Ambulatory Visit (HOSPITAL_COMMUNITY): Payer: Self-pay

## 2022-11-09 LAB — COLOGUARD: COLOGUARD: NEGATIVE

## 2022-11-10 ENCOUNTER — Ambulatory Visit (INDEPENDENT_AMBULATORY_CARE_PROVIDER_SITE_OTHER): Payer: 59 | Admitting: Sports Medicine

## 2022-11-10 ENCOUNTER — Ambulatory Visit: Payer: Self-pay

## 2022-11-10 VITALS — BP 96/68 | Ht 64.0 in | Wt 179.0 lb

## 2022-11-10 DIAGNOSIS — M25511 Pain in right shoulder: Secondary | ICD-10-CM

## 2022-11-11 NOTE — Progress Notes (Signed)
   Subjective:    Patient ID: Sheila Nelson, female    DOB: 29-Aug-1961, 62 y.o.   MRN: [id number]  HPI  Patient presents today for follow-up on a right shoulder injury.  X-rays showed no evidence of shoulder or humerus fracture.  She is doing much better.  She has regained full range of motion.  Still having some intermittent pain along the lateral shoulder but, again, much improved since last week.   Review of Systems As above    Objective:   Physical Exam  Well-developed, well-nourished.  No acute distress  Right shoulder: Full range of motion.  Slight tenderness to palpation along the lateral shoulder.  Rotator cuff stressing shows 5/5 strength with resisted supraspinatus, infraspinatus, and subscapularis.  She does have some reproducible pain with resisted infraspinatus and supraspinatus but, again, no weakness.  Negative O'Brien's.  Neurovascularly intact distally.  X-ray of the right shoulder and humerus are as above      Assessment & Plan:   Right shoulder pain secondary to contusion versus rotator cuff strain-much improved  Although we had previously discussed an ultrasound today to evaluate for a rotator cuff tear, [name] symptoms are improved to the point that we we will wait on that for now.  We will educate her in gentle home exercises but she will not do any exercise that reproduces pain.  If her symptoms do not continue to improve, then she will return to the office for reconsideration of ultrasound evaluation.  Otherwise, increase activity as tolerated and follow-up for ongoing or recalcitrant issues.  This note was dictated using Dragon naturally speaking software and may contain errors in syntax, spelling, or content which have not been identified prior to signing this note.

## 2022-11-24 ENCOUNTER — Other Ambulatory Visit: Payer: Self-pay | Admitting: Surgery

## 2022-11-24 DIAGNOSIS — R928 Other abnormal and inconclusive findings on diagnostic imaging of breast: Secondary | ICD-10-CM

## 2022-11-26 ENCOUNTER — Other Ambulatory Visit: Payer: Self-pay | Admitting: Surgery

## 2022-11-26 DIAGNOSIS — R928 Other abnormal and inconclusive findings on diagnostic imaging of breast: Secondary | ICD-10-CM

## 2022-12-09 ENCOUNTER — Other Ambulatory Visit (HOSPITAL_COMMUNITY): Payer: Self-pay

## 2022-12-14 ENCOUNTER — Other Ambulatory Visit (HOSPITAL_COMMUNITY): Payer: Self-pay

## 2022-12-21 ENCOUNTER — Other Ambulatory Visit (HOSPITAL_COMMUNITY): Payer: Self-pay

## 2022-12-22 ENCOUNTER — Encounter (HOSPITAL_BASED_OUTPATIENT_CLINIC_OR_DEPARTMENT_OTHER): Payer: Self-pay | Admitting: Surgery

## 2022-12-22 ENCOUNTER — Other Ambulatory Visit: Payer: Self-pay

## 2022-12-22 NOTE — Progress Notes (Signed)
   12/22/22 1523  PAT Phone Screen  Is the patient taking a GLP-1 receptor agonist? Yes  Has the patient been informed on holding medication? Yes  Do You Have Diabetes? No  Do You Have Hypertension? Yes  Have You Ever Been to the ER for Asthma? No  Have You Taken Oral Steroids in the Past 3 Months? No  Do you Take Phenteramine or any Other Diet Drugs? No  Recent  Lab Work, EKG, CXR? No  Do you have a history of heart problems? No  Any Recent Hospitalizations? No  Height  (1.626 m)  Weight 81.6 kg  Pat Appointment Scheduled Yes

## 2022-12-28 ENCOUNTER — Encounter (HOSPITAL_BASED_OUTPATIENT_CLINIC_OR_DEPARTMENT_OTHER)
Admission: RE | Admit: 2022-12-28 | Discharge: 2022-12-28 | Disposition: A | Discharge status: Home / Self Care | Payer: 59 | Source: Ambulatory Visit | Attending: Surgery | Admitting: Surgery

## 2022-12-28 DIAGNOSIS — N6021 Fibroadenosis of right breast: Secondary | ICD-10-CM | POA: Diagnosis not present

## 2022-12-28 DIAGNOSIS — M1990 Unspecified osteoarthritis, unspecified site: Secondary | ICD-10-CM | POA: Diagnosis not present

## 2022-12-28 DIAGNOSIS — J45909 Unspecified asthma, uncomplicated: Secondary | ICD-10-CM | POA: Diagnosis not present

## 2022-12-28 DIAGNOSIS — I498 Other specified cardiac arrhythmias: Secondary | ICD-10-CM | POA: Diagnosis not present

## 2022-12-28 DIAGNOSIS — N62 Hypertrophy of breast: Secondary | ICD-10-CM | POA: Diagnosis not present

## 2022-12-28 DIAGNOSIS — Z6832 Body mass index (BMI) 32.0-32.9, adult: Secondary | ICD-10-CM | POA: Diagnosis not present

## 2022-12-28 DIAGNOSIS — R928 Other abnormal and inconclusive findings on diagnostic imaging of breast: Secondary | ICD-10-CM | POA: Diagnosis present

## 2022-12-28 DIAGNOSIS — E669 Obesity, unspecified: Secondary | ICD-10-CM | POA: Diagnosis not present

## 2022-12-28 DIAGNOSIS — I10 Essential (primary) hypertension: Secondary | ICD-10-CM | POA: Diagnosis not present

## 2022-12-28 DIAGNOSIS — K219 Gastro-esophageal reflux disease without esophagitis: Secondary | ICD-10-CM | POA: Diagnosis not present

## 2022-12-28 LAB — BASIC METABOLIC PANEL
Anion gap: 11 (ref 5–15)
BUN: 14 mg/dL (ref 6–20)
CO2: 25 mmol/L (ref 22–32)
Calcium: 9.3 mg/dL (ref 8.9–10.3)
Chloride: 100 mmol/L (ref 98–111)
Creatinine, Ser: 0.91 mg/dL (ref 0.44–1.00)
GFR, Estimated: >60 mL/min (ref >60)
Glucose, Bld: 116 mg/dL — ABNORMAL HIGH (ref 70–99)
Potassium: 3.3 mmol/L — ABNORMAL LOW (ref 3.5–5.1)
Sodium: 136 mmol/L (ref 135–145)

## 2022-12-28 MED ORDER — CHLORHEXIDINE GLUCONATE CLOTH 2 % EX PADS: 6.0000 | MEDICATED_PAD | Freq: Once | CUTANEOUS | Status: DC

## 2022-12-28 MED ORDER — ENSURE PRE-SURGERY PO LIQD: 296.0000 mL | Freq: Once | ORAL | Status: DC

## 2022-12-28 NOTE — Progress Notes (Signed)

## 2022-12-29 ENCOUNTER — Ambulatory Visit
Admission: RE | Admit: 2022-12-29 | Discharge: 2022-12-29 | Disposition: A | Discharge status: Home / Self Care | Payer: 59 | Source: Ambulatory Visit | Attending: Surgery | Admitting: Surgery

## 2022-12-29 DIAGNOSIS — N6489 Other specified disorders of breast: Secondary | ICD-10-CM | POA: Diagnosis not present

## 2022-12-29 DIAGNOSIS — R928 Other abnormal and inconclusive findings on diagnostic imaging of breast: Secondary | ICD-10-CM

## 2022-12-29 HISTORY — PX: BREAST BIOPSY: SHX20

## 2022-12-29 NOTE — H&P (Signed)
REFERRING PHYSICIAN:  Early, Sung Amabile, NP PROVIDER:  Wayne Both, MD MRN: [id number] DOB: [birth year]  Subjective  Chief Complaint: New Consultation (Enlarging RIGHT breast mass)   History of Present Illness: Sheila Nelson is a 62 y.o. female who is seen  as an office consultation for evaluation of New Consultation (Enlarging RIGHT breast mass)     This is a pleasant 62 year old female who was found on screening mammography in August of last year to have a distortion in her right breast.  There was no ultrasound correlate.  She underwent a biopsy showing benign breast tissue.  She was brought back for follow-up mammograms in March of this year and the distortion was slightly larger in the right breast so surgical excision was recommended.  She has had no previous problems regarding her breast.  She has no nipple discharge.  She is otherwise without complaints.  She has no cardiopulmonary issues other than hypertension.  She has had no previous problems with general anesthesia.  There is no family history of breast cancer    Review of Systems: A complete review of systems was obtained from the patient.  I have reviewed this information and discussed as appropriate with the patient.  See HPI as well for other ROS.   ROS    Medical History: Past Medical History      Past Medical History:  Diagnosis Date   Hypertension           Problem List  There is no problem list on file for this patient.      Past Surgical History       Past Surgical History:  Procedure Laterality Date   APPENDECTOMY       CESAREAN SECTION       CHOLECYSTECTOMY       Dilation and curettage of uterus       HERNIA REPAIR            Allergies      Allergies  Allergen Reactions   Fentanyl Nausea        Medications Ordered Prior to Encounter        Current Outpatient Medications on File Prior to Visit  Medication Sig Dispense Refill   valsartan (DIOVAN) 40 MG tablet         WEGOVY 1.7  mg/0.75 mL pen injector         hydroCHLOROthiazide (HYDRODIURIL) 25 MG tablet         loratadine (CLARITIN) 10 mg tablet         multivitamin tablet Take by mouth       valACYclovir (VALTREX) 1000 MG tablet Take 1 tablet by mouth once daily        No current facility-administered medications on file prior to visit.        Family History       Family History  Problem Relation Age of Onset   Deep vein thrombosis (DVT or abnormal blood clot formation) Mother     Stroke Father     Obesity Father     High blood pressure (Hypertension) Father     Obesity Sister     High blood pressure (Hypertension) Sister     Obesity Brother     High blood pressure (Hypertension) Brother     Hyperlipidemia (Elevated cholesterol) Brother          Tobacco Use History  Social History       Tobacco Use  Smoking Status Never  Smokeless Tobacco Never  Social History  Social History         Socioeconomic History   Marital status: Married  Tobacco Use   Smoking status: Never   Smokeless tobacco: Never  Substance and Sexual Activity   Alcohol use: Yes      Alcohol/week: 4.0 standard drinks of alcohol      Types: 4 Glasses of wine per week   Drug use: Never        Objective:       Vitals:     BP: 122/78  Pulse: 61  Temp: 36.7 C (98 F)  SpO2: 97%  Weight: 83.9 kg (185 lb)  Height: 162.6 cm (5\' 4" )    Body mass index is 31.76 kg/m.   Physical Exam    She appears well on exam.   Her right breast is normal in appearance.  The nipple areolar complex is normal.  There are no palpable breast masses.   There is no right axillary adenopathy.   Labs, Imaging and Diagnostic Testing: I have reviewed her mammograms and pathology results.   Assessment and Plan:     Diagnoses and all orders for this visit:   Abnormal mammogram of right breast     She has a persistent distortion which is slightly enlarging in the right breast.  Complete surgical removal of this area is  recommended.  This is to rule out malignancy.  I discussed proceeding with a radioactive seed guided right breast lumpectomy.  I explained the surgical procedure in detail.  We discussed the risks which includes but is not limited to bleeding, infection, the need for further procedures if malignancy is found, cardiopulmonary issues with anesthesia, postoperative recovery, etc.  She understands and wishes to proceed with surgery which will be scheduled.

## 2022-12-30 ENCOUNTER — Ambulatory Visit (HOSPITAL_BASED_OUTPATIENT_CLINIC_OR_DEPARTMENT_OTHER): Payer: 59 | Admitting: Certified Registered"

## 2022-12-30 ENCOUNTER — Encounter (HOSPITAL_BASED_OUTPATIENT_CLINIC_OR_DEPARTMENT_OTHER)
Admission: RE | Disposition: A | Discharge status: Home / Self Care | Payer: Self-pay | Source: Home / Self Care | Attending: Surgery

## 2022-12-30 ENCOUNTER — Other Ambulatory Visit (HOSPITAL_COMMUNITY): Payer: Self-pay

## 2022-12-30 ENCOUNTER — Other Ambulatory Visit: Payer: Self-pay

## 2022-12-30 ENCOUNTER — Encounter (HOSPITAL_BASED_OUTPATIENT_CLINIC_OR_DEPARTMENT_OTHER): Payer: Self-pay | Admitting: Surgery

## 2022-12-30 ENCOUNTER — Ambulatory Visit (HOSPITAL_BASED_OUTPATIENT_CLINIC_OR_DEPARTMENT_OTHER)
Admission: RE | Admit: 2022-12-30 | Discharge: 2022-12-30 | Disposition: A | Discharge status: Home / Self Care | Payer: 59 | Attending: Surgery | Admitting: Surgery

## 2022-12-30 ENCOUNTER — Ambulatory Visit
Admission: RE | Admit: 2022-12-30 | Discharge: 2022-12-30 | Disposition: A | Discharge status: Home / Self Care | Payer: 59 | Source: Ambulatory Visit | Attending: Surgery | Admitting: Surgery

## 2022-12-30 DIAGNOSIS — N6011 Diffuse cystic mastopathy of right breast: Secondary | ICD-10-CM | POA: Diagnosis not present

## 2022-12-30 DIAGNOSIS — I10 Essential (primary) hypertension: Secondary | ICD-10-CM | POA: Diagnosis not present

## 2022-12-30 DIAGNOSIS — R928 Other abnormal and inconclusive findings on diagnostic imaging of breast: Secondary | ICD-10-CM

## 2022-12-30 DIAGNOSIS — Z6832 Body mass index (BMI) 32.0-32.9, adult: Secondary | ICD-10-CM | POA: Diagnosis not present

## 2022-12-30 DIAGNOSIS — J45909 Unspecified asthma, uncomplicated: Secondary | ICD-10-CM | POA: Insufficient documentation

## 2022-12-30 DIAGNOSIS — N6489 Other specified disorders of breast: Secondary | ICD-10-CM | POA: Diagnosis not present

## 2022-12-30 DIAGNOSIS — K219 Gastro-esophageal reflux disease without esophagitis: Secondary | ICD-10-CM | POA: Diagnosis not present

## 2022-12-30 DIAGNOSIS — I498 Other specified cardiac arrhythmias: Secondary | ICD-10-CM | POA: Diagnosis not present

## 2022-12-30 DIAGNOSIS — N6021 Fibroadenosis of right breast: Secondary | ICD-10-CM | POA: Diagnosis not present

## 2022-12-30 DIAGNOSIS — M1990 Unspecified osteoarthritis, unspecified site: Secondary | ICD-10-CM | POA: Diagnosis not present

## 2022-12-30 DIAGNOSIS — E669 Obesity, unspecified: Secondary | ICD-10-CM | POA: Insufficient documentation

## 2022-12-30 DIAGNOSIS — N62 Hypertrophy of breast: Secondary | ICD-10-CM | POA: Insufficient documentation

## 2022-12-30 DIAGNOSIS — Z9011 Acquired absence of right breast and nipple: Secondary | ICD-10-CM | POA: Diagnosis not present

## 2022-12-30 HISTORY — DX: Other specified postprocedural states: Z98.890

## 2022-12-30 HISTORY — DX: Other seasonal allergic rhinitis: J30.2

## 2022-12-30 HISTORY — PX: BREAST LUMPECTOMY WITH RADIOACTIVE SEED LOCALIZATION: SHX6424

## 2022-12-30 SURGERY — BREAST LUMPECTOMY WITH RADIOACTIVE SEED LOCALIZATION
Anesthesia: General | Site: Breast | Laterality: Right

## 2022-12-30 MED ORDER — TRAMADOL HCL 50 MG PO TABS
50.0000 mg | ORAL_TABLET | Freq: Four times a day (QID) | ORAL | 0 refills | Status: DC | PRN
  Filled 2022-12-30: qty 25, 7d supply, fill #0

## 2022-12-30 MED ORDER — PROMETHAZINE HCL 25 MG/ML IJ SOLN: 6.2500 mg | INTRAMUSCULAR | Status: DC | PRN

## 2022-12-30 MED ORDER — DROPERIDOL 2.5 MG/ML IJ SOLN
INTRAMUSCULAR | Status: DC | PRN
  Administered 2022-12-30: .625 mg via INTRAVENOUS

## 2022-12-30 MED ORDER — OXYCODONE HCL 5 MG PO TABS: 5.0000 mg | ORAL_TABLET | Freq: Once | ORAL | Status: DC | PRN

## 2022-12-30 MED ORDER — ACETAMINOPHEN 500 MG PO TABS
ORAL_TABLET | ORAL | Status: AC
  Filled 2022-12-30: qty 2

## 2022-12-30 MED ORDER — MIDAZOLAM HCL 2 MG/2ML IJ SOLN
INTRAMUSCULAR | Status: AC
  Filled 2022-12-30: qty 2

## 2022-12-30 MED ORDER — DEXAMETHASONE SODIUM PHOSPHATE 10 MG/ML IJ SOLN
INTRAMUSCULAR | Status: AC
  Filled 2022-12-30: qty 1

## 2022-12-30 MED ORDER — OXYCODONE HCL 5 MG/5ML PO SOLN: 5.0000 mg | Freq: Once | ORAL | Status: DC | PRN

## 2022-12-30 MED ORDER — CEFAZOLIN SODIUM-DEXTROSE 2-4 GM/100ML-% IV SOLN
INTRAVENOUS | Status: AC
  Filled 2022-12-30: qty 100

## 2022-12-30 MED ORDER — MIDAZOLAM HCL 5 MG/5ML IJ SOLN
INTRAMUSCULAR | Status: DC | PRN
  Administered 2022-12-30: 2 mg via INTRAVENOUS

## 2022-12-30 MED ORDER — HYDROMORPHONE HCL 1 MG/ML IJ SOLN: 0.2500 mg | INTRAMUSCULAR | Status: DC | PRN

## 2022-12-30 MED ORDER — FENTANYL CITRATE (PF) 100 MCG/2ML IJ SOLN
INTRAMUSCULAR | Status: DC | PRN
  Administered 2022-12-30: 25 ug via INTRAVENOUS

## 2022-12-30 MED ORDER — PROPOFOL 10 MG/ML IV BOLUS
INTRAVENOUS | Status: AC
  Filled 2022-12-30: qty 20

## 2022-12-30 MED ORDER — CEFAZOLIN SODIUM-DEXTROSE 2-4 GM/100ML-% IV SOLN
2.0000 g | INTRAVENOUS | Status: AC
  Administered 2022-12-30: 2 g via INTRAVENOUS

## 2022-12-30 MED ORDER — EPHEDRINE 5 MG/ML INJ
INTRAVENOUS | Status: AC
  Filled 2022-12-30: qty 5

## 2022-12-30 MED ORDER — DEXAMETHASONE SODIUM PHOSPHATE 10 MG/ML IJ SOLN
INTRAMUSCULAR | Status: DC | PRN
  Administered 2022-12-30: 10 mg via INTRAVENOUS

## 2022-12-30 MED ORDER — FENTANYL CITRATE (PF) 100 MCG/2ML IJ SOLN
INTRAMUSCULAR | Status: AC
  Filled 2022-12-30: qty 2

## 2022-12-30 MED ORDER — LIDOCAINE HCL (CARDIAC) PF 100 MG/5ML IV SOSY
PREFILLED_SYRINGE | INTRAVENOUS | Status: DC | PRN
  Administered 2022-12-30: 60 mg via INTRAVENOUS

## 2022-12-30 MED ORDER — ONDANSETRON HCL 4 MG/2ML IJ SOLN
INTRAMUSCULAR | Status: AC
  Filled 2022-12-30: qty 2

## 2022-12-30 MED ORDER — ACETAMINOPHEN 500 MG PO TABS
1000.0000 mg | ORAL_TABLET | ORAL | Status: AC
  Administered 2022-12-30: 1000 mg via ORAL

## 2022-12-30 MED ORDER — LIDOCAINE 2% (20 MG/ML) 5 ML SYRINGE
INTRAMUSCULAR | Status: AC
  Filled 2022-12-30: qty 5

## 2022-12-30 MED ORDER — BUPIVACAINE-EPINEPHRINE 0.5% -1:200000 IJ SOLN
INTRAMUSCULAR | Status: DC | PRN
  Administered 2022-12-30: 11 mL
  Administered 2022-12-30: 7 mL

## 2022-12-30 MED ORDER — PROPOFOL 10 MG/ML IV BOLUS
INTRAVENOUS | Status: DC | PRN
  Administered 2022-12-30: 200 mg via INTRAVENOUS

## 2022-12-30 MED ORDER — ONDANSETRON HCL 4 MG/2ML IJ SOLN
INTRAMUSCULAR | Status: DC | PRN
  Administered 2022-12-30: 4 mg via INTRAVENOUS

## 2022-12-30 MED ORDER — LACTATED RINGERS IV SOLN: INTRAVENOUS | Status: DC

## 2022-12-30 MED ORDER — DROPERIDOL 2.5 MG/ML IJ SOLN
INTRAMUSCULAR | Status: AC
  Filled 2022-12-30: qty 2

## 2022-12-30 MED ORDER — EPHEDRINE SULFATE (PRESSORS) 50 MG/ML IJ SOLN
INTRAMUSCULAR | Status: DC | PRN
  Administered 2022-12-30: 5 mg via INTRAVENOUS

## 2022-12-30 SURGICAL SUPPLY — 47 items
ADH SKN CLS APL DERMABOND .7 (GAUZE/BANDAGES/DRESSINGS) ×1
APL PRP STRL LF DISP 70% ISPRP (MISCELLANEOUS) ×1
APPLIER CLIP 9.375 MED OPEN (MISCELLANEOUS)
APR CLP MED 9.3 20 MLT OPN (MISCELLANEOUS)
BINDER BREAST 3XL (GAUZE/BANDAGES/DRESSINGS) IMPLANT
BINDER BREAST LRG (GAUZE/BANDAGES/DRESSINGS) IMPLANT
BINDER BREAST MEDIUM (GAUZE/BANDAGES/DRESSINGS) IMPLANT
BINDER BREAST XLRG (GAUZE/BANDAGES/DRESSINGS) IMPLANT
BINDER BREAST XXLRG (GAUZE/BANDAGES/DRESSINGS) IMPLANT
BLADE SURG 15 STRL LF DISP TIS (BLADE) ×1 IMPLANT
BLADE SURG 15 STRL SS (BLADE) ×1
CANISTER SUC SOCK COL 7IN (MISCELLANEOUS) IMPLANT
CANISTER SUCT 1200ML W/VALVE (MISCELLANEOUS) IMPLANT
CHLORAPREP W/TINT 26 (MISCELLANEOUS) ×1 IMPLANT
CLIP APPLIE 9.375 MED OPEN (MISCELLANEOUS) IMPLANT
COVER BACK TABLE 60X90IN (DRAPES) ×1 IMPLANT
COVER MAYO STAND STRL (DRAPES) ×1 IMPLANT
COVER PROBE CYLINDRICAL 5X96 (MISCELLANEOUS) ×1 IMPLANT
DERMABOND ADVANCED .7 DNX12 (GAUZE/BANDAGES/DRESSINGS) ×1 IMPLANT
DRAPE LAPAROSCOPIC ABDOMINAL (DRAPES) ×1 IMPLANT
DRAPE UTILITY XL STRL (DRAPES) ×1 IMPLANT
ELECT REM PT RETURN 9FT ADLT (ELECTROSURGICAL) ×1
ELECTRODE REM PT RTRN 9FT ADLT (ELECTROSURGICAL) ×1 IMPLANT
GAUZE SPONGE 4X4 12PLY STRL LF (GAUZE/BANDAGES/DRESSINGS) IMPLANT
GLOVE SURG SIGNA 7.5 PF LTX (GLOVE) ×1 IMPLANT
GOWN STRL REUS W/ TWL LRG LVL3 (GOWN DISPOSABLE) ×1 IMPLANT
GOWN STRL REUS W/ TWL XL LVL3 (GOWN DISPOSABLE) ×1 IMPLANT
GOWN STRL REUS W/TWL LRG LVL3 (GOWN DISPOSABLE) ×1
GOWN STRL REUS W/TWL XL LVL3 (GOWN DISPOSABLE) ×1
KIT MARKER MARGIN INK (KITS) ×1 IMPLANT
NDL HYPO 25X1 1.5 SAFETY (NEEDLE) ×1 IMPLANT
NEEDLE HYPO 25X1 1.5 SAFETY (NEEDLE) ×1 IMPLANT
NS IRRIG 1000ML POUR BTL (IV SOLUTION) IMPLANT
PACK BASIN DAY SURGERY FS (CUSTOM PROCEDURE TRAY) ×1 IMPLANT
PENCIL SMOKE EVACUATOR (MISCELLANEOUS) ×1 IMPLANT
SLEEVE SCD COMPRESS KNEE MED (STOCKING) ×1 IMPLANT
SPIKE FLUID TRANSFER (MISCELLANEOUS) IMPLANT
SPONGE T-LAP 4X18 ~~LOC~~+RFID (SPONGE) ×1 IMPLANT
SUT MNCRL AB 4-0 PS2 18 (SUTURE) ×1 IMPLANT
SUT SILK 2 0 SH (SUTURE) IMPLANT
SUT VIC AB 3-0 SH 27 (SUTURE) ×1
SUT VIC AB 3-0 SH 27X BRD (SUTURE) ×1 IMPLANT
SYR CONTROL 10ML LL (SYRINGE) ×1 IMPLANT
TOWEL GREEN STERILE FF (TOWEL DISPOSABLE) ×1 IMPLANT
TRAY FAXITRON CT DISP (TRAY / TRAY PROCEDURE) ×1 IMPLANT
TUBE CONNECTING 20X1/4 (TUBING) IMPLANT
YANKAUER SUCT BULB TIP NO VENT (SUCTIONS) IMPLANT

## 2022-12-30 NOTE — Interval H&P Note (Signed)
History and Physical Interval Note:no change in H and P  12/30/2022 9:14 AM  Sheila Nelson  has presented today for surgery, with the diagnosis of RIGHT BREAST DISTORTION/ABNORMAL MAMMOGRAM.  The various methods of treatment have been discussed with the patient and family. After consideration of risks, benefits and other options for treatment, the patient has consented to  Procedure(s): RIGHT BREAST LUMPECTOMY WITH RADIOACTIVE SEED LOCALIZATION (Right) as a surgical intervention.  The patient's history has been reviewed, patient examined, no change in status, stable for surgery.  I have reviewed the patient's chart and labs.  Questions were answered to the patient's satisfaction.     Abigail Miyamoto

## 2022-12-30 NOTE — Op Note (Signed)
   Sheila Nelson 12/30/2022   Pre-op Diagnosis: RIGHT BREAST DISTORTION/ABNORMAL MAMMOGRAM     Post-op Diagnosis: same  Procedure(s): RIGHT BREAST LUMPECTOMY WITH RADIOACTIVE SEED LOCALIZATION  Surgeon(s): Abigail Miyamoto, MD  Anesthesia: General  Staff:  Circulator: Kennith Maes, RN Scrub Person: Gillermo Murdoch  Estimated Blood Loss: Minimal               Specimens: sent to path  Indications: This is a 62 year old female who was found to have a small distortion on screening mammography in August of last year.  She underwent further imaging including ultrasound which did not show discrete mass.  A biopsy of this area by radiology showed benign breast tissue.  She underwent a 61-month follow-up mammogram which showed an increase in the size of the distortion so surgical removal with a lumpectomy of the right breast was recommended  Procedure: The patient was brought to the operating room and identified the correct patient.  She was placed upon on the operating room table and general anesthesia was induced.  Her right breast was then prepped and draped in usual sterile fashion.  Using the neoprobe the radioactive seed was located in the right breast at the 12 o'clock position several centimeters from the nipple.  I anesthetized the upper edge of the areola with Marcaine and then made a circumareolar incision with a scalpel.  I then dissected superiorly toward the signal from the radioactive seed and then down to the chest wall.  I grasped the area with a clamp and elevated and complete the lumpectomy staying around the signal from the neoprobe and radioactive seed.  This was all the way down to the chest wall.  Once I remove the lumpectomy specimen I marked all margins with paint.  An x-ray was performed on the specimen confirming that the radioactive seed and original biopsy clip were in the specimen.  The lumpectomy specimen was then sent to pathology for complete evaluation.  I  achieved hemostasis with the cautery.  I anesthetized the incision further with Marcaine.  I then closed the subcutaneous tissue with interrupted 3-0 Vicryl sutures and closed the skin with a running 4-0 Monocryl.  Dermabond was then applied.  The patient tolerated the procedure well.  All the counts were correct at the end of the procedure.  The patient was then extubated in the operating room and taken in a stable condition to the recovery room.          Abigail Miyamoto   Date: 12/30/2022  Time: 10:24 AM

## 2022-12-30 NOTE — Anesthesia Preprocedure Evaluation (Signed)
Anesthesia Evaluation  Patient identified by MRN, date of birth, ID band Patient awake    Reviewed: Allergy & Precautions, NPO status , Patient's Chart, lab work & pertinent test results, Unable to perform ROS - Chart review only  History of Anesthesia Complications (+) PONV and history of anesthetic complications  Airway Mallampati: II  TM Distance: >3 FB Neck ROM: Full    Dental  (+) Teeth Intact, Dental Advisory Given   Pulmonary asthma    breath sounds clear to auscultation       Cardiovascular hypertension, Pt. on medications  Rhythm:Regular Rate:Normal     Neuro/Psych    GI/Hepatic ,GERD  ,,  Endo/Other    Renal/GU      Musculoskeletal  (+) Arthritis , Osteoarthritis,    Abdominal  (+) + obese  Peds  Hematology   Anesthesia Other Findings   Reproductive/Obstetrics                             Anesthesia Physical Anesthesia Plan  ASA: 2  Anesthesia Plan: General   Post-op Pain Management: Tylenol PO (pre-op)* and Minimal or no pain anticipated   Induction: Intravenous  PONV Risk Score and Plan: 4 or greater and Ondansetron, Dexamethasone, Midazolam, Droperidol and Treatment may vary due to age or medical condition  Airway Management Planned: LMA  Additional Equipment:   Intra-op Plan:   Post-operative Plan: Extubation in OR  Informed Consent: I have reviewed the patients History and Physical, chart, labs and discussed the procedure including the risks, benefits and alternatives for the proposed anesthesia with the patient or authorized representative who has indicated his/her understanding and acceptance.     Dental advisory given  Plan Discussed with:   Anesthesia Plan Comments:         Anesthesia Quick Evaluation

## 2022-12-30 NOTE — Discharge Instructions (Addendum)
[facility] Office Phone Number [phone]  BREAST BIOPSY/ PARTIAL MASTECTOMY: POST OP INSTRUCTIONS  Always review your discharge instruction sheet given to you by the facility where your surgery was performed.  IF YOU HAVE DISABILITY OR FAMILY LEAVE FORMS, YOU MUST BRING THEM TO THE OFFICE FOR PROCESSING.  DO NOT GIVE THEM TO YOUR DOCTOR.  A prescription for pain medication may be given to you upon discharge.  Take your pain medication as prescribed, if needed.  If narcotic pain medicine is not needed, then you may take acetaminophen (Tylenol) or ibuprofen (Advil) as needed. Take your usually prescribed medications unless otherwise directed If you need a refill on your pain medication, please contact your pharmacy.  They will contact our office to request authorization.  Prescriptions will not be filled after 5pm or on week-ends. You should eat very light the first 24 hours after surgery, such as soup, crackers, pudding, etc.  Resume your normal diet the day after surgery. Most patients will experience some swelling and bruising in the breast.  Ice packs and a good support bra will help.  Swelling and bruising can take several days to resolve.  It is common to experience some constipation if taking pain medication after surgery.  Increasing fluid intake and taking a stool softener will usually help or prevent this problem from occurring.  A mild laxative (Milk of Magnesia or Miralax) should be taken according to package directions if there are no bowel movements after 48 hours. Unless discharge instructions indicate otherwise, you may remove your bandages 24-48 hours after surgery, and you may shower at that time.  You may have steri-strips (small skin tapes) in place directly over the incision.  These strips should be left on the skin for 7-10 days.  If your surgeon used skin glue on the incision, you may shower in 24 hours.  The glue will flake off over the next 2-3 weeks.  Any  sutures or staples will be removed at the office during your follow-up visit. ACTIVITIES:  You may resume regular daily activities (gradually increasing) beginning the next day.  Wearing a good support bra or sports bra minimizes pain and swelling.  You may have sexual intercourse when it is comfortable. You may drive when you no longer are taking prescription pain medication, you can comfortably wear a seatbelt, and you can safely maneuver your car and apply brakes. RETURN TO WORK:  ______________________________________________________________________________________ Sheila Nelson should see your doctor in the office for a follow-up appointment approximately two weeks after your surgery.  Your doctor's nurse will typically make your follow-up appointment when she calls you with your pathology report.  Expect your pathology report 2-3 business days after your surgery.  You may call to check if you do not hear from [place] after three days. OTHER INSTRUCTIONS: _YOU MAY SHOWER STARTING TOMORROW ICE PACK, TYLENOL, AND IBUPROFEN ALSO FOR PAIN NO VIGOROUS ACTIVITY FOR ONE WEEK ______________________________________________________________________________________________ _____________________________________________________________________________________________________________________________________ _____________________________________________________________________________________________________________________________________ _____________________________________________________________________________________________________________________________________  WHEN TO CALL YOUR DOCTOR: Fever over 101.0 Nausea and/or vomiting. Extreme swelling or bruising. Continued bleeding from incision. Increased pain, redness, or drainage from the incision.  The clinic staff is available to answer your questions during regular business hours.  Please don't hesitate to call and ask to speak to one of the nurses for clinical  concerns.  If you have a medical emergency, go to the nearest emergency room or call 911.  A surgeon from [facility] is always on call at the hospital.  For further questions, please visit  [email or web address]   May take Tylenol if needed after 2:30 pm.    Post Anesthesia Home Care Instructions  Activity: Get plenty of rest for the remainder of the day. A responsible individual must stay with you for 24 hours following the procedure.  For the next 24 hours, DO NOT: -Drive a car -Advertising copywriter -Drink alcoholic beverages -Take any medication unless instructed by your physician -Make any legal decisions or sign important papers.  Meals: Start with liquid foods such as gelatin or soup. Progress to regular foods as tolerated. Avoid greasy, spicy, heavy foods. If nausea and/or vomiting occur, drink only clear liquids until the nausea and/or vomiting subsides. Call your physician if vomiting continues.  Special Instructions/Symptoms: Your throat may feel dry or sore from the anesthesia or the breathing tube placed in your throat during surgery. If this causes discomfort, gargle with warm salt water. The discomfort should disappear within 24 hours.

## 2022-12-30 NOTE — Transfer of Care (Signed)
Immediate Anesthesia Transfer of Care Note  Patient: Sheila Nelson  Procedure(s) Performed: RIGHT BREAST LUMPECTOMY WITH RADIOACTIVE SEED LOCALIZATION (Right: Breast)  Patient Location: PACU  Anesthesia Type:General  Level of Consciousness: awake, alert , and oriented  Airway & Oxygen Therapy: Patient Spontanous Breathing and Patient connected to face mask oxygen  Post-op Assessment: Report given to RN and Post -op Vital signs reviewed and stable  Post vital signs: Reviewed and stable  Last Vitals:  Vitals Value Taken Time  BP 114/73 12/30/22 1030  Temp    Pulse 77 12/30/22 1030  Resp 21 12/30/22 1030  SpO2 100 % 12/30/22 1030  Vitals shown include unvalidated device data.  Last Pain:  Vitals:   12/30/22 0821  TempSrc: Oral  PainSc: 0-No pain      Patients Stated Pain Goal: 3 (12/30/22 1610)  Complications: No notable events documented.

## 2022-12-30 NOTE — Anesthesia Procedure Notes (Signed)
Procedure Name: LMA Insertion Date/Time: 12/30/2022 9:49 AM  Performed by: Lauralyn Primes, CRNAPre-anesthesia Checklist: Patient identified, Emergency Drugs available, Suction available and Patient being monitored Patient Re-evaluated:Patient Re-evaluated prior to induction Oxygen Delivery Method: Circle system utilized Preoxygenation: Pre-oxygenation with 100% oxygen Induction Type: IV induction Ventilation: Mask ventilation without difficulty LMA: LMA inserted LMA Size: 4.0 Number of attempts: 1 Airway Equipment and Method: Bite block Placement Confirmation: positive ETCO2 Tube secured with: Tape Dental Injury: Teeth and Oropharynx as per pre-operative assessment

## 2022-12-31 ENCOUNTER — Encounter: Payer: Self-pay | Admitting: Nurse Practitioner

## 2022-12-31 ENCOUNTER — Encounter (HOSPITAL_BASED_OUTPATIENT_CLINIC_OR_DEPARTMENT_OTHER): Payer: Self-pay | Admitting: Surgery

## 2022-12-31 NOTE — Anesthesia Postprocedure Evaluation (Signed)
Anesthesia Post Note  Patient: Sheila Nelson  Procedure(s) Performed: RIGHT BREAST LUMPECTOMY WITH RADIOACTIVE SEED LOCALIZATION (Right: Breast)     Patient location during evaluation: PACU Anesthesia Type: General Level of consciousness: awake and alert Pain management: pain level controlled Vital Signs Assessment: post-procedure vital signs reviewed and stable Respiratory status: spontaneous breathing, nonlabored ventilation and respiratory function stable Cardiovascular status: blood pressure returned to baseline and stable Postop Assessment: no apparent nausea or vomiting Anesthetic complications: no   No notable events documented.  Last Vitals:  Vitals:   12/30/22 1045 12/30/22 1133  BP: 100/60 118/79  Pulse: 64 68  Resp: 13 14  Temp:  (!) 36.1 C  SpO2: 99% 97%    Last Pain:  Vitals:   12/30/22 1133  TempSrc: Temporal  PainSc: 0-No pain                 Lowella Curb

## 2023-01-01 LAB — SURGICAL PATHOLOGY

## 2023-01-07 ENCOUNTER — Other Ambulatory Visit (HOSPITAL_COMMUNITY): Payer: Self-pay

## 2023-01-08 ENCOUNTER — Other Ambulatory Visit (HOSPITAL_COMMUNITY): Payer: Self-pay

## 2023-01-08 ENCOUNTER — Other Ambulatory Visit: Payer: Self-pay

## 2023-01-18 ENCOUNTER — Encounter: Payer: Self-pay | Admitting: Nurse Practitioner

## 2023-01-18 DIAGNOSIS — Z8241 Family history of sudden cardiac death: Secondary | ICD-10-CM

## 2023-01-18 DIAGNOSIS — I10 Essential (primary) hypertension: Secondary | ICD-10-CM

## 2023-01-18 DIAGNOSIS — E6609 Other obesity due to excess calories: Secondary | ICD-10-CM

## 2023-01-18 DIAGNOSIS — Z8249 Family history of ischemic heart disease and other diseases of the circulatory system: Secondary | ICD-10-CM

## 2023-01-18 DIAGNOSIS — R7303 Prediabetes: Secondary | ICD-10-CM

## 2023-01-20 ENCOUNTER — Other Ambulatory Visit (HOSPITAL_COMMUNITY): Payer: Self-pay

## 2023-01-20 MED ORDER — ZEPBOUND 5 MG/0.5ML ~~LOC~~ SOAJ
5.0000 mg | SUBCUTANEOUS | 3 refills | Status: DC
Start: 2023-01-20 — End: 2024-04-03
  Filled 2023-01-20: qty 2, 28d supply, fill #0
  Filled 2023-11-23: qty 2, 28d supply, fill #1

## 2023-01-20 MED ORDER — ZEPBOUND 7.5 MG/0.5ML ~~LOC~~ SOAJ
7.5000 mg | SUBCUTANEOUS | 2 refills | Status: DC
Start: 2023-01-20 — End: 2024-02-21
  Filled 2023-01-20: qty 6, 84d supply, fill #0
  Filled 2023-03-23: qty 2, 28d supply, fill #0
  Filled 2023-04-25: qty 2, 28d supply, fill #1
  Filled 2023-05-26: qty 2, 28d supply, fill #2
  Filled 2023-06-27: qty 2, 28d supply, fill #3
  Filled 2023-08-01 – 2023-08-12 (×2): qty 2, 28d supply, fill #4
  Filled 2023-09-18: qty 2, 28d supply, fill #5
  Filled 2023-12-27: qty 2, 28d supply, fill #6
  Filled 2024-01-17: qty 2, 28d supply, fill #7

## 2023-01-26 ENCOUNTER — Other Ambulatory Visit (HOSPITAL_COMMUNITY): Payer: Self-pay

## 2023-01-26 ENCOUNTER — Telehealth: Payer: 59 | Admitting: Physician Assistant

## 2023-01-26 DIAGNOSIS — H6993 Unspecified Eustachian tube disorder, bilateral: Secondary | ICD-10-CM | POA: Diagnosis not present

## 2023-01-26 MED ORDER — IPRATROPIUM BROMIDE 0.03 % NA SOLN
2.0000 | Freq: Two times a day (BID) | NASAL | 0 refills | Status: DC
Start: 2023-01-26 — End: 2023-02-16
  Filled 2023-01-26: qty 30, 43d supply, fill #0

## 2023-01-26 NOTE — Progress Notes (Signed)
E-Visit for Ear Pain - Eustachian Tube Dysfunction   We are sorry that you are not feeling well. Here is how we plan to help!  Based on what you have shared with me it looks like you have Eustachian Tube Dysfunction.  Eustachian Tube Dysfunction is a condition where the tubes that connect your middle ears to your upper throat become blocked. This can lead to discomfort, hearing difficulties and a feeling of fullness in your ear. Eustachian tube dysfunction usually resolves itself in a few days. The usual symptoms include: Hearing problems Tinnitus, or ringing in your ears Clicking or popping sounds A feeling of fullness in your ears Pain that mimics an ear infection Dizziness, vertigo or balance problems A "tickling" sensation in your ears  ?Eustachian tube dysfunction symptoms may get worse in higher altitudes. This is called barotrauma, and it can happen while scuba diving, flying in an airplane or driving in the mountains.   What causes eustachian tube dysfunction? Allergies and infections (like the common cold and the flu) are the most common causes of eustachian tube dysfunction. These conditions can cause inflammation and mucus buildup, leading to blockage. GERD, or chronic acid reflux, can also cause ETD. This is because stomach acid can back up into your throat and result in inflammation. As mentioned above, altitude changes can also cause ETD.   What are some common eustachian tube dysfunction treatments? In most cases, treatment isn't necessary because ETD often resolves on its own. However, you might need treatment if your symptoms linger for more than two weeks.    Eustachian tube dysfunction treatment depends on the cause and the severity of your condition. Treatments may include home remedies, medications or, in severe cases, surgery.     HOME CARE: Sometimes simple home remedies can help with mild cases of eustachian tube dysfunction. To try and clear the blockage, you  can: Chew gum. Yawn. Swallow. Try the Valsalva maneuver (breathing out forcefully while closing your mouth and pinching your nostrils). Use a saline spray to clear out nasal passages.  MEDICATIONS: Over-the-counter medications can help if allergies are causing eustachian tube dysfunction. Try antihistamines (like cetirizine or diphenhydramine) to ease your symptoms. If you have discomfort, pain relievers -- such as acetaminophen or ibuprofen -- can help.  Sometimes intranasal glucocorticosteroids (like Flonase or Nasacort) help.  I have prescribed Ipratropium Bromide nasal spray 0.03% two sprays in each nostril 2-3 times a day    GET HELP RIGHT AWAY IF: Fever is over 102.2 degrees. You develop progressive ear pain or hearing loss. Ear symptoms persist longer than 3 days after treatment.  MAKE SURE YOU: Understand these instructions. Will watch your condition. Will get help right away if you are not doing well or get worse.  Thank you for choosing an e-visit.  Your e-visit answers were reviewed by a board certified advanced clinical practitioner to complete your personal care plan. Depending upon the condition, your plan could have included both over the counter or prescription medications.  Please review your pharmacy choice. Make sure the pharmacy is open so you can pick up the prescription now. If there is a problem, you may contact your provider through MyChart messaging and have the prescription routed to another pharmacy.  Your safety is important to us. If you have drug allergies check your prescription carefully.   For the next 24 hours you can use MyChart to ask questions about today's visit, request a non-urgent call back, or ask for a work or school excuse.   You will get an email with a survey after your eVisit asking about your experience. We would appreciate your feedback. I hope that your e-visit has been valuable and will aid in your recovery.      

## 2023-01-26 NOTE — Progress Notes (Signed)
I have spent 5 minutes in review of e-visit questionnaire, review and updating patient chart, medical decision making and response to patient.   Sheila Pol Cody Arriyah Madej, PA-C    

## 2023-01-28 ENCOUNTER — Other Ambulatory Visit (HOSPITAL_COMMUNITY): Payer: Self-pay

## 2023-01-28 ENCOUNTER — Ambulatory Visit (HOSPITAL_COMMUNITY)
Admission: RE | Admit: 2023-01-28 | Discharge: 2023-01-28 | Disposition: A | Discharge status: Home / Self Care | Payer: 59 | Source: Ambulatory Visit | Attending: Physician Assistant | Admitting: Physician Assistant

## 2023-01-28 ENCOUNTER — Encounter (HOSPITAL_COMMUNITY): Payer: Self-pay

## 2023-01-28 VITALS — BP 117/74 | HR 62 | Temp 97.5°F | Resp 17

## 2023-01-28 DIAGNOSIS — H6691 Otitis media, unspecified, right ear: Secondary | ICD-10-CM

## 2023-01-28 MED ORDER — AMOXICILLIN 500 MG PO CAPS
500.0000 mg | ORAL_CAPSULE | Freq: Three times a day (TID) | ORAL | 0 refills | Status: DC
  Filled 2023-01-28: qty 30, 10d supply, fill #0

## 2023-01-28 NOTE — ED Provider Notes (Signed)
[facility]    CSN: [id number] Arrival date & time: 01/28/23  1244      History   Chief Complaint Chief Complaint  Patient presents with   appt 1   Ear Fullness    HPI Sheila Nelson is a 62 y.o. adult.   Patient complains of right ear pain.  Patient reports she has pain in her right ear for over a week.  Patient reports that she has a history of ear infections in the past.  Patient has been using over-the-counter decongestants and Flonase with no relief.  The history is provided by the patient. No language interpreter was used.  Ear Fullness This is a new problem. The current episode started more than 1 week ago. The problem has not changed since onset.Nothing aggravates the symptoms. He has tried nothing for the symptoms. The treatment provided no relief.    Past Medical History:  Diagnosis Date   Allergy    Arthritis    feet and ankles   ASTHMA UNSPECIFIED WITH EXACERBATION    Borderline diabetes    Chest pain    a. 09/2015 felt to be non cardiac after LHC with no CAD    Fasciculation 08/04/2017   Gallstones    GERD (gastroesophageal reflux disease)    H/O varicella    Herpes    HTN (hypertension)    Hx of viral meningitis    Obesity    Pancreatitis    Viral   PLANTAR FASCIITIS, RIGHT 09/30/2010   PONV (postoperative nausea and vomiting)    PVC's (premature ventricular contractions)    REACTIVE AIRWAY DISEASE 10/09/2009   Seasonal allergies    TINNITUS 10/09/2009    Patient Active Problem List   Diagnosis Date Noted   Abdominal bruit 2022/05/13   Family history of abdominal aortic aneurysm (AAA) 2022/05/13   Family history of sudden cardiac death in sister 13-May-2022   Dysuria 01/29/2022   Pre-diabetes 01/29/2022   Left eye pain 04/02/2020   Essential hypertension    Obesity     Past Surgical History:  Procedure Laterality Date   APPENDECTOMY  1992   BREAST BIOPSY  12/29/2022   MM RT RADIOACTIVE SEED LOC MAMMO GUIDE 12/29/2022 [facility]  MAMMOGRAPHY   BREAST LUMPECTOMY WITH RADIOACTIVE SEED LOCALIZATION Right 12/30/2022   Procedure: RIGHT BREAST LUMPECTOMY WITH RADIOACTIVE SEED LOCALIZATION;  Surgeon: Abigail Miyamoto, MD;  Location: [facility];  Service: General;  Laterality: Right;   CARDIAC CATHETERIZATION N/A 09/17/2015   Procedure: Left Heart Cath and Coronary Angiography;  Surgeon: Kathleene Hazel, MD;  Location: [facility];  Service: Cardiovascular;  Laterality: N/A;   CESAREAN SECTION  06/23/2012   Procedure: CESAREAN SECTION;  Surgeon: Lenoard Aden, MD;  Location: [facility];  Service: Obstetrics;  Laterality: N/A;   CHOLECYSTECTOMY N/A 10/05/2016   Procedure: LAPAROSCOPIC CHOLECYSTECTOMY;  Surgeon: Claud Kelp, MD;  Location: [facility];  Service: General;  Laterality: N/A;   DILATION AND CURETTAGE OF UTERUS  2001   INGUINAL HERNIA REPAIR     Infant, bilateral   MOUTH SURGERY     Right thigh surgery  1973   Trauma    OB History     Gravida  2   Para  1   Term  1   Preterm  0   AB  1   Living  1      SAB  0   IAB  1   Ectopic  0   Multiple  0   Live Births  1            Home Medications    Prior to Admission medications   Medication Sig Start Date End Date Taking? Authorizing Provider  amoxicillin (AMOXIL) 500 MG capsule Take 1 capsule (500 mg total) by mouth 3 (three) times daily. 01/28/23  Yes Elson Areas, PA-C  acetaminophen (TYLENOL) 500 MG tablet Take 500 mg by mouth every 6 (six) hours as needed.    [provider]  cetirizine (ZYRTEC) 10 MG tablet Take 10 mg by mouth daily.    [provider]  estradiol (ESTRACE) 0.1 MG/GM vaginal cream Place 1 g vaginally 2 (two) times a week. 01/29/22   Tollie Eth, NP  fluticasone (FLONASE) 50 MCG/ACT nasal spray Place 2 sprays into both nostrils daily. 01/29/22   Tollie Eth, NP  hydrochlorothiazide (HYDRODIURIL) 25 MG tablet Take 1 tablet (25 mg total) by mouth daily. 10/06/22   Tollie Eth,  NP  ipratropium (ATROVENT) 0.03 % nasal spray Place 2 sprays into both nostrils every 12 (twelve) hours. 01/26/23   Waldon Merl, PA-C  meloxicam (MOBIC) 15 MG tablet Take 1 tablet (15 mg total) by mouth daily. 10/06/22   Tollie Eth, NP  MULTIPLE VITAMIN PO Take 1 tablet by mouth daily.    [provider]  tirzepatide (ZEPBOUND) 5 MG/0.5ML Pen Inject 5 mg into the skin once a week. Using Coupon 01/20/23   Early, Sung Amabile, NP  tirzepatide (ZEPBOUND) 7.5 MG/0.5ML Pen Inject 7.5 mg into the skin once a week. 01/20/23   Tollie Eth, NP  valACYclovir (VALTREX) 1000 MG tablet Take 1 tablet (1,000 mg total) by mouth daily. 01/29/22   Tollie Eth, NP  valsartan (DIOVAN) 40 MG tablet Take 1 tablet (40 mg total) by mouth daily. 05/05/22   Tollie Eth, NP  beclomethasone (QVAR) 80 MCG/ACT inhaler Inhale 2 puffs into the lungs 2 (two) times daily. 08/04/11 11/14/11  Reuben Likes, MD    Family History Family History  Problem Relation Age of Onset   Prostate cancer Father    Stroke Father 106   Heart failure Father    Hypertension Father    Cancer Father        prostate   Valvular heart disease Mother        MVR   Thrombocytopenia Mother    Heart disease Mother    Birth defects Sister        tetralogy of falot   Hypertension Brother    Prostate cancer Brother    Stroke Maternal Grandmother    Hypertension Brother    Hypertension Brother    Colon cancer Neg Hx    Esophageal cancer Neg Hx    Stomach cancer Neg Hx    Rectal cancer Neg Hx     Social History Social History   Tobacco Use   Smoking status: Never   Smokeless tobacco: Never   Tobacco comments:    Lives with spouse s/p wedding 07/2010. Employed as RN-pediatric critical care; now [facility] [employer]  Vaping Use   Vaping Use: Never used  Substance Use Topics   Alcohol use: Yes    Comment: 3-5 glasses of wine per week   Drug use: No     Allergies   Fentanyl   Review of Systems Review of Systems  All other systems  reviewed and are negative.    Physical Exam Triage Vital Signs ED Triage Vitals  Enc  Vitals Group     BP 01/28/23 1323 117/74     Pulse Rate 01/28/23 1323 62     Resp 01/28/23 1323 17     Temp 01/28/23 1323 (!) 97.5 F (36.4 C)     Temp Source 01/28/23 1323 Oral     SpO2 01/28/23 1323 98 %     Weight --      Height --      Head Circumference --      Peak Flow --      Pain Score 01/28/23 1324 4     Pain Loc --      Pain Edu? --      Excl. in GC? --    No data found.  Updated Vital Signs BP 117/74 (BP Location: Right Arm)   Pulse 62   Temp (!) 97.5 F (36.4 C) (Oral)   Resp 17   LMP 09/19/2011   SpO2 98%   Visual Acuity Right Eye Distance:   Left Eye Distance:   Bilateral Distance:    Right Eye Near:   Left Eye Near:    Bilateral Near:     Physical Exam Vitals and nursing note reviewed.  Constitutional:      Appearance: He is well-developed.  HENT:     Head: Normocephalic.     Ears:     Comments: Right tm erythematous, poor landmarks, bulging    Nose: Nose normal.  Cardiovascular:     Rate and Rhythm: Normal rate.  Pulmonary:     Effort: Pulmonary effort is normal.  Abdominal:     General: There is no distension.  Musculoskeletal:        General: Normal range of motion.     Cervical back: Normal range of motion.  Skin:    General: Skin is warm.  Neurological:     General: No focal deficit present.     Mental Status: He is alert and oriented to person, place, and time.  Psychiatric:        Mood and Affect: Mood normal.      UC Treatments / Results  Labs (all labs ordered are listed, but only abnormal results are displayed) Labs Reviewed - No data to display  EKG   Radiology No results found.  Procedures Procedures (including critical care time)  Medications Ordered in UC Medications - No data to display  Initial Impression / Assessment and Plan / UC Course  I have reviewed the triage vital signs and the nursing  notes.  Pertinent labs & imaging results that were available during my care of the patient were reviewed by me and considered in my medical decision making (see chart for details).      Final Clinical Impressions(s) / UC Diagnoses   Final diagnoses:  Right otitis media, unspecified otitis media type   Discharge Instructions   None    ED Prescriptions     Medication Sig Dispense Auth. Provider   amoxicillin (AMOXIL) 500 MG capsule Take 1 capsule (500 mg total) by mouth 3 (three) times daily. 30 capsule Elson Areas, [place]      PDMP not reviewed this encounter. An After Visit Summary was printed and given to the patient.    Elson Areas, [place] 01/28/23 1439

## 2023-01-28 NOTE — ED Triage Notes (Signed)
Pt reports been having right ear fullness and pain for over a week. Reports had a tele visit 2 days ago and prescribed nasal spray to help with congestion. Still having right ear fullness and pain with little drainage.

## 2023-02-12 DIAGNOSIS — F419 Anxiety disorder, unspecified: Secondary | ICD-10-CM | POA: Diagnosis not present

## 2023-02-16 ENCOUNTER — Other Ambulatory Visit (HOSPITAL_COMMUNITY): Payer: Self-pay

## 2023-02-16 ENCOUNTER — Ambulatory Visit (INDEPENDENT_AMBULATORY_CARE_PROVIDER_SITE_OTHER): Payer: 59 | Admitting: Nurse Practitioner

## 2023-02-16 ENCOUNTER — Encounter: Payer: Self-pay | Admitting: Nurse Practitioner

## 2023-02-16 VITALS — BP 112/76 | HR 65 | Ht 63.75 in | Wt 186.6 lb

## 2023-02-16 DIAGNOSIS — I10 Essential (primary) hypertension: Secondary | ICD-10-CM

## 2023-02-16 DIAGNOSIS — E6609 Other obesity due to excess calories: Secondary | ICD-10-CM

## 2023-02-16 DIAGNOSIS — Z0000 Encounter for general adult medical examination without abnormal findings: Secondary | ICD-10-CM

## 2023-02-16 DIAGNOSIS — M25561 Pain in right knee: Secondary | ICD-10-CM | POA: Diagnosis not present

## 2023-02-16 DIAGNOSIS — Z6834 Body mass index (BMI) 34.0-34.9, adult: Secondary | ICD-10-CM | POA: Diagnosis not present

## 2023-02-16 DIAGNOSIS — R7303 Prediabetes: Secondary | ICD-10-CM

## 2023-02-16 LAB — CBC WITH DIFFERENTIAL/PLATELET
Basophils Absolute: 0 10*3/uL (ref 0.0–0.2)
Basos: 0 %
EOS (ABSOLUTE): 0.1 10*3/uL (ref 0.0–0.4)
Eos: 1 %
Hematocrit: 39 % (ref 34.0–46.6)
Hemoglobin: 13.3 g/dL (ref 11.1–15.9)
Immature Grans (Abs): 0 10*3/uL (ref 0.0–0.1)
Immature Granulocytes: 0 %
Lymphocytes Absolute: 1.9 10*3/uL (ref 0.7–3.1)
Lymphs: 39 %
MCH: 31.8 pg (ref 26.6–33.0)
MCHC: 34.1 g/dL (ref 31.5–35.7)
MCV: 93 fL (ref 79–97)
Monocytes Absolute: 0.4 10*3/uL (ref 0.1–0.9)
Monocytes: 8 %
Neutrophils Absolute: 2.5 10*3/uL (ref 1.4–7.0)
Neutrophils: 52 %
Platelets: 237 10*3/uL (ref 150–450)
RBC: 4.18 x10E6/uL (ref 3.77–5.28)
RDW: 11.9 % (ref 11.7–15.4)
WBC: 4.8 10*3/uL (ref 3.4–10.8)

## 2023-02-16 LAB — COMPREHENSIVE METABOLIC PANEL
ALT: 17 IU/L (ref 0–32)
AST: 20 IU/L (ref 0–40)
Albumin: 4.7 g/dL (ref 3.8–4.9)
Alkaline Phosphatase: 89 IU/L (ref 44–121)
BUN/Creatinine Ratio: 22 (ref 9–23)
BUN: 16 mg/dL (ref 6–24)
Bilirubin Total: 0.8 mg/dL (ref 0.0–1.2)
CO2: 22 mmol/L (ref 20–29)
Calcium: 9.8 mg/dL (ref 8.7–10.2)
Chloride: 103 mmol/L (ref 96–106)
Creatinine, Ser: 0.73 mg/dL (ref 0.57–1.00)
Globulin, Total: 2.5 g/dL (ref 1.5–4.5)
Glucose: 98 mg/dL (ref 70–99)
Potassium: 4 mmol/L (ref 3.5–5.2)
Sodium: 139 mmol/L (ref 134–144)
Total Protein: 7.2 g/dL (ref 6.0–8.5)
eGFR: 95 mL/min/{1.73_m2} (ref >59)

## 2023-02-16 LAB — LIPID PANEL
Chol/HDL Ratio: 2.4 ratio (ref 0.0–4.4)
Cholesterol, Total: 217 mg/dL — ABNORMAL HIGH (ref 100–199)
HDL: 90 mg/dL (ref >39)
LDL Chol Calc (NIH): 111 mg/dL — ABNORMAL HIGH (ref 0–99)
Triglycerides: 92 mg/dL (ref 0–149)
VLDL Cholesterol Cal: 16 mg/dL (ref 5–40)

## 2023-02-16 LAB — HEMOGLOBIN A1C
Est. average glucose Bld gHb Est-mCnc: 117 mg/dL
Hgb A1c MFr Bld: 5.7 % — ABNORMAL HIGH (ref 4.8–5.6)

## 2023-02-16 MED ORDER — VALSARTAN 40 MG PO TABS
20.0000 mg | ORAL_TABLET | Freq: Every day | ORAL | 3 refills | Status: DC
Start: 2023-02-16 — End: 2024-03-21
  Filled 2023-02-16: qty 45, 90d supply, fill #0
  Filled 2023-09-04: qty 45, 90d supply, fill #1
  Filled 2023-12-18: qty 45, 90d supply, fill #2

## 2023-02-16 MED ORDER — HYDROCHLOROTHIAZIDE 12.5 MG PO TABS
12.5000 mg | ORAL_TABLET | Freq: Every day | ORAL | 3 refills | Status: DC
Start: 2023-02-16 — End: 2024-03-21
  Filled 2023-02-16: qty 90, 90d supply, fill #0
  Filled 2023-09-04: qty 90, 90d supply, fill #1
  Filled 2023-12-18: qty 90, 90d supply, fill #2

## 2023-02-16 MED ORDER — VALACYCLOVIR HCL 1 G PO TABS
1000.0000 mg | ORAL_TABLET | Freq: Every day | ORAL | 4 refills | Status: DC
Start: 2023-02-16 — End: 2024-04-27
  Filled 2023-02-16: qty 90, 90d supply, fill #0
  Filled 2023-11-14: qty 90, 90d supply, fill #1

## 2023-02-16 NOTE — Progress Notes (Signed)
Shawna Clamp, DNP, AGNP-c [facility] [address] [place], [place] [address] Main Office [phone]  BP 112/76   Pulse 65   Ht 5' 3.75" (1.619 m)   Wt 186 lb 9.6 oz (84.6 kg)   LMP 09/19/2011   SpO2 99%   BMI 32.28 kg/m    Subjective:    Patient ID: Sheila Nelson, adult    DOB: 12/14/1960, 62 y.o.   MRN: [id number]  HPI: Sheila Nelson is a 62 y.o. adult presenting on 02/16/2023 for comprehensive medical examination.   Current medical concerns include: Sheila Nelson presents today for CPE.  She has not yet initiated ZepBound treatment, as she would like to complete her remaining doses of Wegovy first. She is hoping for increased weight loss with ZepBound and mentions incorporating more physical activity through walks before work.  Additionally, Sheila Nelson is experiencing right knee problems stemming from a previous injury and quadriceps muscle loss. She describes pain, particularly in the medial ligament area, along with a sense of instability and potential for hyperextension. These symptoms began about a week prior to a recent trip to [place], where she engaged in physically demanding activities. To aid mobility, Sheila Nelson has been using a walking stick/cane, which has led to a limp and subsequent hip pain on the same side. For pain management, Corinda has been taking meloxicam once or twice weekly and Tylenol daily. She feels this has been helpful.   Sheila Nelson experiences occasional constipation and urgency, which she attributes to medication.   She is post menopausal.    Pertinent items are noted in HPI.  IMMUNIZATIONS:   Flu: Flu vaccine postponed until flu season Prevnar 13: Prevnar 13 N/A for this patient Prevnar 20: Prevnar 20 N/A for this patient Pneumovax 23: Pneumovax 23 N/A for this patient Vac Shingrix: Shingrix completed, documentation in chart (Dose # 1 of 2, Dose 2 still needed) HPV: HPV N/A for this patient Tetanus: Tetanus  completed in the last 10 years Verification through [facility] needed COVID: COVID completed, documentation in chart   HEALTH MAINTENANCE: Pap Smear HM Status: is up to date Mammogram HM Status: is up to date Colon Cancer Screening HM Status: is up to date Bone Density HM Status: is not applicable for this patient STI Testing HM Status: is up to date and is not applicable for this patient Lung CT HM Status: is up to date and is not applicable for this patient  She reports regular vision exams q1-5y: Yes  She reports regular dental exams q 93m:  Yes  The patient eats a regular, healthy diet. She endorses exercise and/or activity of: Active 30 + min 4+ x/wk Mode: hiking   Most Recent Depression Screen:     05/05/2022    8:26 AM 01/29/2022    8:15 AM 04/18/2018    8:37 AM 01/08/2018    8:56 AM 09/15/2017    2:35 PM  Depression screen PHQ 2/9  Decreased Interest 0 1 0 0 0  Down, Depressed, Hopeless 0 0 0 0 0  PHQ - 2 Score 0 1 0 0 0  Altered sleeping 0 0     Tired, decreased energy 0 1     Change in appetite 0 1     Feeling bad or failure about yourself  0 0     Trouble concentrating 0 1     Moving slowly or fidgety/restless 0 0     Suicidal thoughts 0 0     PHQ-9 Score 0 4  Difficult doing work/chores Not difficult at all Somewhat difficult      Most Recent Anxiety Screen:      No data to display         Most Recent Fall Screen:    02/16/2023    9:29 AM 10/06/2022    8:23 AM 05/05/2022    8:26 AM 02/27/2022    8:41 AM 01/29/2022    8:15 AM  Fall Risk   Falls in the past year? 0 0 0 0 1  Number falls in past yr: 0 0 0 0 0  Injury with Fall? 0 0 0 0 1  Risk for fall due to : No Fall Risks No Fall Risks No Fall Risks No Fall Risks No Fall Risks  Follow up Falls evaluation completed Falls evaluation completed Falls evaluation completed Falls evaluation completed;Education provided Falls evaluation completed;Education provided    Past medical history, surgical history, medications,  allergies, family history and social history reviewed with patient today and changes made to appropriate areas of the chart.  Past Medical History:  Past Medical History:  Diagnosis Date   Allergy    Arthritis    feet and ankles   ASTHMA UNSPECIFIED WITH EXACERBATION    Borderline diabetes    Chest pain    a. 09/2015 felt to be non cardiac after LHC with no CAD    Fasciculation 08/04/2017   Gallstones    GERD (gastroesophageal reflux disease)    H/O varicella    Herpes    HTN (hypertension)    Hx of viral meningitis    Obesity    Pancreatitis    Viral   PLANTAR FASCIITIS, RIGHT 09/30/2010   PONV (postoperative nausea and vomiting)    PVC's (premature ventricular contractions)    REACTIVE AIRWAY DISEASE 10/09/2009   Seasonal allergies    TINNITUS 10/09/2009   Medications:  Current Outpatient Medications on File Prior to Visit  Medication Sig   acetaminophen (TYLENOL) 500 MG tablet Take 500 mg by mouth every 6 (six) hours as needed.   estradiol (ESTRACE) 0.1 MG/GM vaginal cream Place 1 g vaginally 2 (two) times a week.   fluticasone (FLONASE) 50 MCG/ACT nasal spray Place 2 sprays into both nostrils daily.   meloxicam (MOBIC) 15 MG tablet Take 1 tablet (15 mg total) by mouth daily.   MULTIPLE VITAMIN PO Take 1 tablet by mouth daily.   tirzepatide (ZEPBOUND) 5 MG/0.5ML Pen Inject 5 mg into the skin once a week. Using Coupon (Patient not taking: Reported on 02/16/2023)   tirzepatide (ZEPBOUND) 7.5 MG/0.5ML Pen Inject 7.5 mg into the skin once a week. (Patient not taking: Reported on 02/16/2023)   [DISCONTINUED] beclomethasone (QVAR) 80 MCG/ACT inhaler Inhale 2 puffs into the lungs 2 (two) times daily.   No current facility-administered medications on file prior to visit.   Surgical History:  Past Surgical History:  Procedure Laterality Date   APPENDECTOMY  1992   BREAST BIOPSY  12/29/2022   MM RT RADIOACTIVE SEED LOC MAMMO GUIDE 12/29/2022 [facility] MAMMOGRAPHY   BREAST LUMPECTOMY  WITH RADIOACTIVE SEED LOCALIZATION Right 12/30/2022   Procedure: RIGHT BREAST LUMPECTOMY WITH RADIOACTIVE SEED LOCALIZATION;  Surgeon: Abigail Miyamoto, MD;  Location: [facility];  Service: General;  Laterality: Right;   CARDIAC CATHETERIZATION N/A 09/17/2015   Procedure: Left Heart Cath and Coronary Angiography;  Surgeon: Kathleene Hazel, MD;  Location: [facility] INVASIVE CV LAB;  Service: Cardiovascular;  Laterality: N/A;   CESAREAN SECTION  06/23/2012   Procedure: CESAREAN  SECTION;  Surgeon: Lenoard Aden, MD;  Location: [facility];  Service: Obstetrics;  Laterality: N/A;   CHOLECYSTECTOMY N/A 10/05/2016   Procedure: LAPAROSCOPIC CHOLECYSTECTOMY;  Surgeon: Claud Kelp, MD;  Location: [facility];  Service: General;  Laterality: N/A;   DILATION AND CURETTAGE OF UTERUS  2001   INGUINAL HERNIA REPAIR     Infant, bilateral   MOUTH SURGERY     Right thigh surgery  1973   Trauma   Allergies:  Allergies  Allergen Reactions   Fentanyl Nausea Only   Family History:  Family History  Problem Relation Age of Onset   Prostate cancer Father    Stroke Father 79   Heart failure Father    Hypertension Father    Cancer Father        prostate   Valvular heart disease Mother        MVR   Thrombocytopenia Mother    Heart disease Mother    Birth defects Sister        tetralogy of falot   Hypertension Brother    Prostate cancer Brother    Stroke Maternal Grandmother    Hypertension Brother    Hypertension Brother    Colon cancer Neg Hx    Esophageal cancer Neg Hx    Stomach cancer Neg Hx    Rectal cancer Neg Hx        Objective:    BP 112/76   Pulse 65   Ht 5' 3.75" (1.619 m)   Wt 186 lb 9.6 oz (84.6 kg)   LMP 09/19/2011   SpO2 99%   BMI 32.28 kg/m   Wt Readings from Last 3 Encounters:  02/16/23 186 lb 9.6 oz (84.6 kg)  12/30/22 187 lb 6.3 oz (85 kg)  11/10/22 179 lb (81.2 kg)    Physical Exam Vitals and nursing note reviewed.  Constitutional:      General: She is  not in acute distress.    Appearance: Normal appearance.  HENT:     Head: Normocephalic and atraumatic.     Right Ear: Hearing, tympanic membrane, ear canal and external ear normal.     Left Ear: Hearing, tympanic membrane, ear canal and external ear normal.     Nose: Nose normal.     Right Sinus: No maxillary sinus tenderness or frontal sinus tenderness.     Left Sinus: No maxillary sinus tenderness or frontal sinus tenderness.     Mouth/Throat:     Lips: Pink.     Mouth: Mucous membranes are moist.     Pharynx: Oropharynx is clear.  Eyes:     General: Lids are normal. Vision grossly intact.     Extraocular Movements: Extraocular movements intact.     Conjunctiva/sclera: Conjunctivae normal.     Pupils: Pupils are equal, round, and reactive to light.     Funduscopic exam:    Right eye: Red reflex present.        Left eye: Red reflex present.    Visual Fields: Right eye visual fields normal and left eye visual fields normal.  Neck:     Thyroid: No thyromegaly.     Vascular: No carotid bruit.  Cardiovascular:     Rate and Rhythm: Normal rate and regular rhythm.     Chest Wall: PMI is not displaced.     Pulses: Normal pulses.          Dorsalis pedis pulses are 2+ on the right side and 2+ on the left side.  Posterior tibial pulses are 2+ on the right side and 2+ on the left side.     Heart sounds: Normal heart sounds. No murmur heard. Pulmonary:     Effort: Pulmonary effort is normal. No respiratory distress.     Breath sounds: Normal breath sounds.  Abdominal:     General: Abdomen is flat. Bowel sounds are normal. There is no distension.     Palpations: Abdomen is soft. There is no hepatomegaly, splenomegaly or mass.     Tenderness: There is no abdominal tenderness. There is no right CVA tenderness, left CVA tenderness, guarding or rebound.  Musculoskeletal:        General: Normal range of motion.     Cervical back: Full passive range of motion without pain, normal range  of motion and neck supple. No tenderness.     Right lower leg: No edema.     Left lower leg: No edema.     Comments: Strength intact in lower extremities with some pain noted in the medial right knee  Feet:     Left foot:     Toenail Condition: Left toenails are normal.  Lymphadenopathy:     Cervical: No cervical adenopathy.     Upper Body:     Right upper body: No supraclavicular adenopathy.     Left upper body: No supraclavicular adenopathy.  Skin:    General: Skin is warm and dry.     Capillary Refill: Capillary refill takes less than 2 seconds.     Nails: There is no clubbing.  Neurological:     General: No focal deficit present.     Mental Status: She is alert and oriented to person, place, and time.     GCS: GCS eye subscore is 4. GCS verbal subscore is 5. GCS motor subscore is 6.     Sensory: Sensation is intact.     Motor: Motor function is intact.     Coordination: Coordination is intact.     Gait: Gait is intact.     Deep Tendon Reflexes: Reflexes are normal and symmetric.  Psychiatric:        Attention and Perception: Attention normal.        Mood and Affect: Mood normal.        Speech: Speech normal.        Behavior: Behavior normal. Behavior is cooperative.        Thought Content: Thought content normal.        Cognition and Memory: Cognition and memory normal.        Judgment: Judgment normal.     Results for orders placed or performed in visit on 02/16/23  Hemoglobin A1c  Result Value Ref Range   Hgb A1c MFr Bld 5.7 (H) 4.8 - 5.6 %   Est. average glucose Bld gHb Est-mCnc 117 mg/dL  Lipid panel  Result Value Ref Range   Cholesterol, Total 217 (H) 100 - 199 mg/dL   Triglycerides 92 0 - 149 mg/dL   HDL 90 >96 mg/dL   VLDL Cholesterol Cal 16 5 - 40 mg/dL   LDL Chol Calc (NIH) 295 (H) 0 - 99 mg/dL   Chol/HDL Ratio 2.4 0.0 - 4.4 ratio  CBC with Differential/Platelet  Result Value Ref Range   WBC 4.8 3.4 - 10.8 x10E3/uL   RBC 4.18 3.77 - 5.28 x10E6/uL    Hemoglobin 13.3 11.1 - 15.9 g/dL   Hematocrit 28.4 13.2 - 46.6 %   MCV 93 79 - 97 fL  MCH 31.8 26.6 - 33.0 pg   MCHC 34.1 31.5 - 35.7 g/dL   RDW 10.9 32.3 - 55.7 %   Platelets 237 150 - 450 x10E3/uL   Neutrophils 52 Not Estab. %   Lymphs 39 Not Estab. %   Monocytes 8 Not Estab. %   Eos 1 Not Estab. %   Basos 0 Not Estab. %   Neutrophils Absolute 2.5 1.4 - 7.0 x10E3/uL   Lymphocytes Absolute 1.9 0.7 - 3.1 x10E3/uL   Monocytes Absolute 0.4 0.1 - 0.9 x10E3/uL   EOS (ABSOLUTE) 0.1 0.0 - 0.4 x10E3/uL   Basophils Absolute 0.0 0.0 - 0.2 x10E3/uL   Immature Granulocytes 0 Not Estab. %   Immature Grans (Abs) 0.0 0.0 - 0.1 x10E3/uL  Comprehensive metabolic panel  Result Value Ref Range   Glucose 98 70 - 99 mg/dL   BUN 16 6 - 24 mg/dL   Creatinine, Ser 3.22 0.57 - 1.00 mg/dL   eGFR 95 >02 RK/YHC/6.23   BUN/Creatinine Ratio 22 9 - 23   Sodium 139 134 - 144 mmol/L   Potassium 4.0 3.5 - 5.2 mmol/L   Chloride 103 96 - 106 mmol/L   CO2 22 20 - 29 mmol/L   Calcium 9.8 8.7 - 10.2 mg/dL   Total Protein 7.2 6.0 - 8.5 g/dL   Albumin 4.7 3.8 - 4.9 g/dL   Globulin, Total 2.5 1.5 - 4.5 g/dL   Bilirubin Total 0.8 0.0 - 1.2 mg/dL   Alkaline Phosphatase 89 44 - 121 IU/L   AST 20 0 - 40 IU/L   ALT 17 0 - 32 IU/L         Assessment & Plan:   Problem List Items Addressed This Visit     Essential hypertension    Sheila Nelson is doing well with her hypertension with weight management. At this time her blood pressures are well controlled. No changes needed at this time. Continue with diet and exercise and hydrochlorothiazide 12.5mg  and valsartan 40mg  daily.       Relevant Medications   hydrochlorothiazide (HYDRODIURIL) 12.5 MG tablet   valsartan (DIOVAN) 40 MG tablet   Other Relevant Orders   Hemoglobin A1c (Completed)   Lipid panel (Completed)   CBC with Differential/Platelet (Completed)   Comprehensive metabolic panel (Completed)   Obesity    Sheila Nelson has had significant success with weight  loss with Ozempic and will transition to Zepbound in the near future, when her current supply of Ozempic runs out. She has had improvement in her blood pressure and overall feeling of health with weight management. Recommend continued lifestyle changes and increased walking the mornings, as planned.       Relevant Medications   valsartan (DIOVAN) 40 MG tablet   Other Relevant Orders   Hemoglobin A1c (Completed)   Lipid panel (Completed)   CBC with Differential/Platelet (Completed)   Comprehensive metabolic panel (Completed)   Pre-diabetes    Pre-diabetes well controlled with weight management, diet, exercise, and Ozempic. She will transition to Zepbound in the near future. No changes are needed at this time. Recommend continue with current efforts.       Relevant Medications   hydrochlorothiazide (HYDRODIURIL) 12.5 MG tablet   valsartan (DIOVAN) 40 MG tablet   Other Relevant Orders   Hemoglobin A1c (Completed)   Lipid panel (Completed)   CBC with Differential/Platelet (Completed)   Comprehensive metabolic panel (Completed)   Pain in right knee    Right knee pain with history of previous injury to same leg. We  will plan on referral to orthopedics for further evaluation and management as current efforts are not effective.       Relevant Orders   Ambulatory referral to Orthopedic Surgery   Encounter for annual physical exam - Primary    CPE completed today.   Labs ordered. Will make changes as necessary based on results.  Review of HM activities and recommendations discussed and provided on AVS. Anticipatory guidance, diet, and exercise recommendations provided.  Medications, allergies, and hx reviewed and updated as necessary.  Plan to f/u with CPE in 1 year or sooner for acute/chronic health needs as directed.        Relevant Medications   valACYclovir (VALTREX) 1000 MG tablet   hydrochlorothiazide (HYDRODIURIL) 12.5 MG tablet   valsartan (DIOVAN) 40 MG tablet   Other  Relevant Orders   Hemoglobin A1c (Completed)   Lipid panel (Completed)   CBC with Differential/Platelet (Completed)   Comprehensive metabolic panel (Completed)   Ambulatory referral to Orthopedic Surgery       Follow up plan: Return in about 1 year (around 02/16/2024) for CPE.  NEXT PREVENTATIVE PHYSICAL DUE IN 1 YEAR.  PATIENT COUNSELING PROVIDED FOR ALL ADULT PATIENTS: A well balanced diet low in saturated fats, cholesterol, and moderation in carbohydrates.  This can be as simple as monitoring portion sizes and cutting back on sugary beverages such as soda and juice to start with.    Daily water consumption of at least 64 ounces.  Physical activity at least 180 minutes per week.  If just starting out, start 10 minutes a day and work your way up.   This can be as simple as taking the stairs instead of the elevator and walking 2-3 laps around the office  purposefully every day.   STD protection, partner selection, and regular testing if Nelson risk.  Limited consumption of alcoholic beverages if alcohol is consumed. For men, I recommend no more than 14 alcoholic beverages per week, spread out throughout the week (max 2 per day). Avoid "binge" drinking or consuming large quantities of alcohol in one setting.  Please let me know if you feel you may need help with reduction or quitting alcohol consumption.   Avoidance of nicotine, if used. Please let me know if you feel you may need help with reduction or quitting nicotine use.   Daily mental health attention. This can be in the form of 5 minute daily meditation, prayer, journaling, yoga, reflection, etc.  Purposeful attention to your emotions and mental state can significantly improve your overall wellbeing  and  Health.  Please know that I am here to help you with all of your health care goals and am happy to work with you to find a solution that works best for you.  The greatest advice I have received with any changes in life are  to take it one step at a time, that even means if all you can focus on is the next 60 seconds, then do that and celebrate your victories.  With any changes in life, you will have set backs, and that is OK. The important thing to remember is, if you have a set back, it is not a failure, it is an opportunity to try again! Screening Testing Mammogram Every 1 -2 years based on history and risk factors Starting at age 78 Pap Smear Ages 21-39 every 3 years Ages 19-65 every 5 years with HPV testing More frequent testing may be required based on results and history Colon Cancer  Screening Every 1-10 years based on test performed, risk factors, and history Starting at age 42 Bone Density Screening Every 2-10 years based on history Starting at age 8 for women Recommendations for men differ based on medication usage, history, and risk factors AAA Screening One time ultrasound Men 92-4 years old who have every smoked Lung Cancer Screening Low Dose Lung CT every 12 months Age 29-80 years with a 30 pack-year smoking history who still smoke or who have quit within the last 15 years   Screening Labs Routine  Labs: Complete Blood Count (CBC), Complete Metabolic Panel (CMP), Cholesterol (Lipid Panel) Every 6-12 months based on history and medications May be recommended more frequently based on current conditions or previous results Hemoglobin A1c Lab Every 3-12 months based on history and previous results Starting at age 70 or earlier with diagnosis of diabetes, Nelson cholesterol, BMI >26, and/or risk factors Frequent monitoring for patients with diabetes to ensure blood sugar control Thyroid Panel (TSH) Every 6 months based on history, symptoms, and risk factors May be repeated more often if on medication HIV One time testing for all patients 34 and older May be repeated more frequently for patients with increased risk factors or exposure Hepatitis C One time testing for all patients 33 and  older May be repeated more frequently for patients with increased risk factors or exposure Gonorrhea, Chlamydia Every 12 months for all sexually active persons 13-24 years Additional monitoring may be recommended for those who are considered Nelson risk or who have symptoms Every 12 months for any woman on birth control, regardless of sexual activity PSA Men 9-76 years old with risk factors Additional screening may be recommended from age 62-69 based on risk factors, symptoms, and history  Vaccine Recommendations Tetanus Booster All adults every 10 years Flu Vaccine All patients 6 months and older every year COVID Vaccine All patients 12 years and older Initial dosing with booster May recommend additional booster based on age and health history HPV Vaccine 2 doses all patients age 74-26 Dosing may be considered for patients over 26 Shingles Vaccine (Shingrix) 2 doses all adults 55 years and older Pneumonia (Pneumovax 34) All adults 65 years and older May recommend earlier dosing based on health history One year apart from Prevnar 70 Pneumonia (Prevnar 1) All adults 65 years and older Dosed 1 year after Pneumovax 23 Pneumonia (Prevnar 20) One time alternative to the two dosing of 13 and 23 For all adults with initial dose of 23, 20 is recommended 1 year later For all adults with initial dose of 13, 23 is still recommended as second option 1 year later

## 2023-02-16 NOTE — Patient Instructions (Signed)
I have sent in your refills for you. Let me know if you have any concerns.   Let me know when you need refills on the Zepbound.   There are no preventive care reminders to display for this patient.   For all adult patients, I recommend A well balanced diet low in saturated fats, cholesterol, and moderation in carbohydrates.   This can be as simple as monitoring portion sizes and cutting back on sugary beverages such as soda and juice to start with.    Daily water consumption of at least 64 ounces.  Physical activity at least 180 minutes per week, if just starting out.   This can be as simple as taking the stairs instead of the elevator and walking 2-3 laps around the office  purposefully every day.   STD protection, partner selection, and regular testing if high risk.  Limited consumption of alcoholic beverages if alcohol is consumed.  For women, I recommend no more than 7 alcoholic beverages per week, spread out throughout the week.  Avoid "binge" drinking or consuming large quantities of alcohol in one setting.   Please let me know if you feel you may need help with reduction or quitting alcohol consumption.   Avoidance of nicotine, if used.  Please let me know if you feel you may need help with reduction or quitting nicotine use.   Daily mental health attention.  This can be in the form of 5 minute daily meditation, prayer, journaling, yoga, reflection, etc.   Purposeful attention to your emotions and mental state can significantly improve your overall wellbeing  and  Health.  Please know that I am here to help you with all of your health care goals and am happy to work with you to find a solution that works best for you.  The greatest advice I have received with any changes in life are to take it one step at a time, that even means if all you can focus on is the next 60 seconds, then do that and celebrate your victories.  With any changes in life, you will have set backs, and that  is OK. The important thing to remember is, if you have a set back, it is not a failure, it is an opportunity to try again!  Health Maintenance Recommendations Screening Testing Mammogram Every 1 -2 years based on history and risk factors Starting at age 47 Pap Smear Ages 21-39 every 3 years Ages 65-65 every 5 years with HPV testing More frequent testing may be required based on results and history Colon Cancer Screening Every 1-10 years based on test performed, risk factors, and history Starting at age 59 Bone Density Screening Every 2-10 years based on history Starting at age 20 for women Recommendations for men differ based on medication usage, history, and risk factors AAA Screening One time ultrasound Men 42-43 years old who have every smoked Lung Cancer Screening Low Dose Lung CT every 12 months Age 2-80 years with a 30 pack-year smoking history who still smoke or who have quit within the last 15 years  Screening Labs Routine  Labs: Complete Blood Count (CBC), Complete Metabolic Panel (CMP), Cholesterol (Lipid Panel) Every 6-12 months based on history and medications May be recommended more frequently based on current conditions or previous results Hemoglobin A1c Lab Every 3-12 months based on history and previous results Starting at age 26 or earlier with diagnosis of diabetes, high cholesterol, BMI >26, and/or risk factors Frequent monitoring for patients with diabetes  to ensure blood sugar control Thyroid Panel (TSH w/ T3 & T4) Every 6 months based on history, symptoms, and risk factors May be repeated more often if on medication HIV One time testing for all patients 87 and older May be repeated more frequently for patients with increased risk factors or exposure Hepatitis C One time testing for all patients 42 and older May be repeated more frequently for patients with increased risk factors or exposure Gonorrhea, Chlamydia Every 12 months for all sexually active  persons 13-24 years Additional monitoring may be recommended for those who are considered high risk or who have symptoms PSA Men 80-30 years old with risk factors Additional screening may be recommended from age 11-69 based on risk factors, symptoms, and history  Vaccine Recommendations Tetanus Booster All adults every 10 years Flu Vaccine All patients 6 months and older every year COVID Vaccine All patients 12 years and older Initial dosing with booster May recommend additional booster based on age and health history HPV Vaccine 2 doses all patients age 70-26 Dosing may be considered for patients over 26 Shingles Vaccine (Shingrix) 2 doses all adults 55 years and older Pneumonia (Pneumovax 23) All adults 65 years and older May recommend earlier dosing based on health history Pneumonia (Prevnar 82) All adults 65 years and older Dosed 1 year after Pneumovax 23  Additional Screening, Testing, and Vaccinations may be recommended on an individualized basis based on family history, health history, risk factors, and/or exposure.

## 2023-02-22 ENCOUNTER — Ambulatory Visit: Payer: 59 | Admitting: Physician Assistant

## 2023-02-26 DIAGNOSIS — F419 Anxiety disorder, unspecified: Secondary | ICD-10-CM | POA: Diagnosis not present

## 2023-03-02 ENCOUNTER — Encounter: Payer: Self-pay | Admitting: Physician Assistant

## 2023-03-02 ENCOUNTER — Other Ambulatory Visit: Payer: Self-pay | Admitting: Nurse Practitioner

## 2023-03-02 ENCOUNTER — Ambulatory Visit (INDEPENDENT_AMBULATORY_CARE_PROVIDER_SITE_OTHER): Payer: 59 | Admitting: Physician Assistant

## 2023-03-02 ENCOUNTER — Other Ambulatory Visit (INDEPENDENT_AMBULATORY_CARE_PROVIDER_SITE_OTHER): Payer: 59

## 2023-03-02 DIAGNOSIS — M25561 Pain in right knee: Secondary | ICD-10-CM | POA: Diagnosis not present

## 2023-03-02 NOTE — Progress Notes (Signed)
Office Visit Note   Patient: Sheila Nelson           Date of Birth: [birth year]           MRN: [id number] Visit Date: 03/02/2023              Requested by: Tollie Eth, NP [address] [place],  [place] [address] PCP: Tollie Eth, NP   Assessment & Plan: Visit Diagnoses:  1. Acute pain of right knee     Plan: Patient is a pleasant 62 year old woman who stays very active with flyfishing and hiking.  Comes in today with a right knee pain since mid May.  Denies any specific injury.  Hurts more going downhill.  Sometimes she feels like it is not stable and it gives way on her.  She is status post a significant soft tissue muscle injury to her right thigh that she sustained as a child.  She is always had more weakness on this right side because of that.  She denies any fever chills or recent illness.  She does have significant muscle quad atrophy compared to the unaffected side.  She actually has arthritis in both of her knees by x-ray but I think this side is more symptomatic because of her difficulty maintaining quad mass.  We talked about different ideas.  She is not interested in an injection and she has a good workout program for quadricep strengthening but she is going to do on a regular basis.  We also talked about different topical medications.  At any time if she wants to pursue an injection she may contact me  Follow-Up Instructions: Return if symptoms worsen or fail to improve.   Orders:  Orders Placed This Encounter  Procedures   XR KNEE 3 VIEW RIGHT   No orders of the defined types were placed in this encounter.     Procedures: No procedures performed   Clinical Data: No additional findings.   Subjective: Chief Complaint  Patient presents with   Right Knee - Pain    HPI pleasant 62 year old woman with on and off right knee pain since mid May.  Denies any injuries.  She is an avid Animal nutritionist.  She notices it when she is walking across  uneven ground.  Sometimes she feels like it can take her knee wants to give way.  She does use hiking poles when she hikes.  Describes her pain as moderate  Review of Systems  All other systems reviewed and are negative.    Objective: Vital Signs: LMP 09/19/2011   Physical Exam Constitutional:      Appearance: Normal appearance.  Pulmonary:     Effort: Pulmonary effort is normal.  Skin:    General: Skin is warm and dry.  Neurological:     General: No focal deficit present.     Mental Status: He is alert and oriented to person, place, and time.  Psychiatric:        Mood and Affect: Mood normal.     Ortho Exam Right knee no erythema no effusion.  She does have a well-healed scar on her lateral upper thigh.  She has noted quadricep atrophy but can fire her quadricep.  She has some tenderness both medially and laterally.  Mild grinding with range of motion good stability of the knee with valgus varus testing good endpoint on anterior draw she is neurovascular intact compartments are soft and nontender Specialty Comments:  No specialty comments available.  Imaging: XR KNEE 3 VIEW RIGHT  Result Date: 03/02/2023 3 views right knee were reviewed today.  She does have some joint space narrowing and sclerotic changes with very small periarticular osteophytes.  No acute fractures noted.    PMFS History: Patient Active Problem List   Diagnosis Date Noted   Pain in right knee 03/02/2023   Abdominal bruit 19-May-2022   Family history of abdominal aortic aneurysm (AAA) May 19, 2022   Family history of sudden cardiac death in sister 05/19/2022   Dysuria 01/29/2022   Pre-diabetes 01/29/2022   Left eye pain 04/02/2020   Essential hypertension    Obesity    Past Medical History:  Diagnosis Date   Allergy    Arthritis    feet and ankles   ASTHMA UNSPECIFIED WITH EXACERBATION    Borderline diabetes    Chest pain    a. 09/2015 felt to be non cardiac after LHC with no CAD     Fasciculation 08/04/2017   Gallstones    GERD (gastroesophageal reflux disease)    H/O varicella    Herpes    HTN (hypertension)    Hx of viral meningitis    Obesity    Pancreatitis    Viral   PLANTAR FASCIITIS, RIGHT 09/30/2010   PONV (postoperative nausea and vomiting)    PVC's (premature ventricular contractions)    REACTIVE AIRWAY DISEASE 10/09/2009   Seasonal allergies    TINNITUS 10/09/2009    Family History  Problem Relation Age of Onset   Prostate cancer Father    Stroke Father 50   Heart failure Father    Hypertension Father    Cancer Father        prostate   Valvular heart disease Mother        MVR   Thrombocytopenia Mother    Heart disease Mother    Birth defects Sister        tetralogy of falot   Hypertension Brother    Prostate cancer Brother    Stroke Maternal Grandmother    Hypertension Brother    Hypertension Brother    Colon cancer Neg Hx    Esophageal cancer Neg Hx    Stomach cancer Neg Hx    Rectal cancer Neg Hx     Past Surgical History:  Procedure Laterality Date   APPENDECTOMY  1992   BREAST BIOPSY  12/29/2022   MM RT RADIOACTIVE SEED LOC MAMMO GUIDE 12/29/2022 [facility]   BREAST LUMPECTOMY WITH RADIOACTIVE SEED LOCALIZATION Right 12/30/2022   Procedure: RIGHT BREAST LUMPECTOMY WITH RADIOACTIVE SEED LOCALIZATION;  Surgeon: Abigail Miyamoto, MD;  Location: [facility];  Service: General;  Laterality: Right;   CARDIAC CATHETERIZATION N/A 09/17/2015   Procedure: Left Heart Cath and Coronary Angiography;  Surgeon: Kathleene Hazel, MD;  Location: [facility]AS[facility] CV LAB;  Service: Cardiovascular;  Laterality: N/A;   CESAREAN SECTION  06/23/2012   Procedure: CESAREAN SECTION;  Surgeon: Lenoard Aden, MD;  Location: [facility];  Service: Obstetrics;  Laterality: N/A;   CHOLECYSTECTOMY N/A 10/05/2016   Procedure: LAPAROSCOPIC CHOLECYSTECTOMY;  Surgeon: Claud Kelp, MD;  Location: [facility];  Service: General;  Laterality: N/A;    DILATION AND CURETTAGE OF UTERUS  2001   INGUINAL HERNIA REPAIR     Infant, bilateral   MOUTH SURGERY     Right thigh surgery  1973   Trauma   Social History   Occupational History   Occupation: Teacher, adult education: [employer]  Tobacco Use  Smoking status: Never   Smokeless tobacco: Never   Tobacco comments:    Lives with spouse s/p wedding 07/2010. Employed as RN-pediatric critical care; now [employer] [facility]  Vaping Use   Vaping Use: Never used  Substance and Sexual Activity   Alcohol use: Yes    Comment: 3-5 glasses of wine per week   Drug use: No   Sexual activity: Yes    Partners: Male

## 2023-03-23 ENCOUNTER — Other Ambulatory Visit (HOSPITAL_COMMUNITY): Payer: Self-pay

## 2023-03-25 DIAGNOSIS — Z0000 Encounter for general adult medical examination without abnormal findings: Secondary | ICD-10-CM | POA: Insufficient documentation

## 2023-03-25 NOTE — Assessment & Plan Note (Signed)
CPE completed today.   Labs ordered. Will make changes as necessary based on results.  Review of HM activities and recommendations discussed and provided on AVS Anticipatory guidance, diet, and exercise recommendations provided.  Medications, allergies, and hx reviewed and updated as necessary.  Plan to f/u with CPE in 1 year or sooner for acute/chronic health needs as directed.   

## 2023-03-25 NOTE — Assessment & Plan Note (Signed)
Sheila Nelson is doing well with her hypertension with weight management. At this time her blood pressures are well controlled. No changes needed at this time. Continue with diet and exercise and hydrochlorothiazide 12.5mg  and valsartan 40mg  daily.

## 2023-03-25 NOTE — Assessment & Plan Note (Signed)
Right knee pain with history of previous injury to same leg. We will plan on referral to orthopedics for further evaluation and management as current efforts are not effective.

## 2023-03-25 NOTE — Assessment & Plan Note (Signed)
Pre-diabetes well controlled with weight management, diet, exercise, and Ozempic. She will transition to Zepbound in the near future. No changes are needed at this time. Recommend continue with current efforts.

## 2023-03-25 NOTE — Assessment & Plan Note (Signed)
Sheila Nelson has had significant success with weight loss with Ozempic and will transition to Zepbound in the near future, when her current supply of Ozempic runs out. She has had improvement in her blood pressure and overall feeling of health with weight management. Recommend continued lifestyle changes and increased walking the mornings, as planned.

## 2023-03-29 DIAGNOSIS — F419 Anxiety disorder, unspecified: Secondary | ICD-10-CM | POA: Diagnosis not present

## 2023-04-26 ENCOUNTER — Other Ambulatory Visit (HOSPITAL_COMMUNITY): Payer: Self-pay

## 2023-05-24 ENCOUNTER — Other Ambulatory Visit (HOSPITAL_COMMUNITY): Payer: Self-pay

## 2023-05-26 ENCOUNTER — Other Ambulatory Visit (HOSPITAL_COMMUNITY): Payer: Self-pay

## 2023-06-04 DIAGNOSIS — F419 Anxiety disorder, unspecified: Secondary | ICD-10-CM | POA: Diagnosis not present

## 2023-06-28 ENCOUNTER — Other Ambulatory Visit (HOSPITAL_COMMUNITY): Payer: Self-pay

## 2023-07-06 ENCOUNTER — Encounter: Payer: Self-pay | Admitting: Nurse Practitioner

## 2023-07-08 ENCOUNTER — Other Ambulatory Visit (HOSPITAL_COMMUNITY): Payer: Self-pay

## 2023-07-28 ENCOUNTER — Ambulatory Visit (HOSPITAL_BASED_OUTPATIENT_CLINIC_OR_DEPARTMENT_OTHER): Payer: 59 | Admitting: Orthopaedic Surgery

## 2023-07-28 ENCOUNTER — Ambulatory Visit (HOSPITAL_BASED_OUTPATIENT_CLINIC_OR_DEPARTMENT_OTHER): Payer: 59

## 2023-07-28 ENCOUNTER — Encounter (HOSPITAL_BASED_OUTPATIENT_CLINIC_OR_DEPARTMENT_OTHER): Payer: Self-pay | Admitting: Orthopaedic Surgery

## 2023-07-28 DIAGNOSIS — M47812 Spondylosis without myelopathy or radiculopathy, cervical region: Secondary | ICD-10-CM | POA: Diagnosis not present

## 2023-07-28 DIAGNOSIS — M25512 Pain in left shoulder: Secondary | ICD-10-CM

## 2023-07-28 DIAGNOSIS — M5030 Other cervical disc degeneration, unspecified cervical region: Secondary | ICD-10-CM | POA: Diagnosis not present

## 2023-07-28 DIAGNOSIS — M542 Cervicalgia: Secondary | ICD-10-CM | POA: Diagnosis not present

## 2023-07-28 DIAGNOSIS — G8929 Other chronic pain: Secondary | ICD-10-CM

## 2023-07-28 DIAGNOSIS — M4802 Spinal stenosis, cervical region: Secondary | ICD-10-CM | POA: Diagnosis not present

## 2023-07-28 NOTE — Progress Notes (Signed)
Chief Complaint: Left shoulder pain and arm numbness     History of Present Illness:    Sheila Nelson is a 62 y.o. adult presents today with left shoulder pain and numbness radiating down the arm.  This has been ongoing for approximately 6 to 7 months.  Denies any specific injury.  She states that she did have a fall while hiking but this was on the right side.  Denies any loss of motion in the shoulder.  States that the fingers do go numb at night but this is increasingly happening during the day.  She has been on meloxicam as well.    PMH/PSH/Family History/Social History/Meds/Allergies:    Past Medical History:  Diagnosis Date   Allergy    Arthritis    feet and ankles   ASTHMA UNSPECIFIED WITH EXACERBATION    Borderline diabetes    Chest pain    a. 09/2015 felt to be non cardiac after LHC with no CAD    Fasciculation 08/04/2017   Gallstones    GERD (gastroesophageal reflux disease)    H/O varicella    Herpes    HTN (hypertension)    Hx of viral meningitis    Obesity    Pancreatitis    Viral   PLANTAR FASCIITIS, RIGHT 09/30/2010   PONV (postoperative nausea and vomiting)    PVC's (premature ventricular contractions)    REACTIVE AIRWAY DISEASE 10/09/2009   Seasonal allergies    TINNITUS 10/09/2009   Past Surgical History:  Procedure Laterality Date   APPENDECTOMY  1992   BREAST BIOPSY  12/29/2022   MM RT RADIOACTIVE SEED LOC MAMMO GUIDE 12/29/2022 [facility] MAMMOGRAP[facility]   BREAST LUMPECTOMY WITH RADIOACTIVE SEED LOCALIZATION Right 12/30/2022   Procedure: RIGHT BREAST LUMPECTOMY WITH RADIOACTIVE SEED LOCALIZATION;  Surgeon: Abigail Miyamoto, MD;  Location: [facility];  Service: General;  Laterality: Right;   CARDIAC CATHETERIZATION N/A 09/17/2015   Procedure: Left Heart Cath and Coronary Angiography;  Surgeon: Kathleene Hazel, MD;  Location: [facility] INVASIVE CV LAB;  Service: Cardiovascular;  Laterality: N/A;   CESAREAN SECTION  06/23/2012    Procedure: CESAREAN SECTION;  Surgeon: Lenoard Aden, MD;  Location: [facility];  Service: Obstetrics;  Laterality: N/A;   CHOLECYSTECTOMY N/A 10/05/2016   Procedure: LAPAROSCOPIC CHOLECYSTECTOMY;  Surgeon: Claud Kelp, MD;  Location: [facility];  Service: General;  Laterality: N/A;   DILATION AND CURETTAGE OF UTERUS  2001   INGUINAL HERNIA REPAIR     Infant, bilateral   MOUTH SURGERY     Right thigh surgery  1973   Trauma   Social History   Socioeconomic History   Marital status: Married    Spouse name: Shean   Number of children: 1   Years of education: 16   Highest education level: Bachelor's degree (e.g., BA, AB, BS)  Occupational History   Occupation: Teacher, adult education: [employer]  Tobacco Use   Smoking status: Never   Smokeless tobacco: Never   Tobacco comments:    Lives with spouse s/p wedding 07/2010. Employed as RN-pediatric critical care; now [employer]  Vaping Use   Vaping status: Never Used  Substance and Sexual Activity   Alcohol use: Yes    Comment: 3-5 glasses of wine per week   Drug use: No   Sexual activity: Yes    Partners: Male  Other Topics Concern   Not on file  Social History Narrative   Lives with partner and son.   1-2 cups  caffeine per day.   Right-handed.   Social Determinants of Health   Financial Resource Strain: Low Risk  (02/16/2023)   Overall Financial Resource Strain (CARDIA)    Difficulty of Paying Living Expenses: Not very hard  Food Insecurity: No Food Insecurity (02/16/2023)   Hunger Vital Sign    Worried About Running Out of Food in the Last Year: Never true    Ran Out of Food in the Last Year: Never true  Transportation Needs: No Transportation Needs (02/16/2023)   PRAPARE - Administrator, Civil Service (Medical): No    Lack of Transportation (Non-Medical): No  Physical Activity: Sufficiently Active (02/16/2023)   Exercise Vital Sign    Days of Exercise per Week: 7 days    Minutes of Exercise per Session: 40 min  Stress:  Stress Concern Present (02/16/2023)   [employer] of Occupational Health - Occupational Stress Questionnaire    Feeling of Stress : Rather much  Social Connections: Socially Integrated (02/16/2023)   Social Connection and Isolation Panel [NHANES]    Frequency of Communication with Friends and Family: Three times a week    Frequency of Social Gatherings with Friends and Family: Three times a week    Attends Religious Services: More than 4 times per year    Active Member of Clubs or Organizations: Yes    Attends Banker Meetings: 1 to 4 times per year    Marital Status: Married   Family History  Problem Relation Age of Onset   Prostate cancer Father    Stroke Father 26   Heart failure Father    Hypertension Father    Cancer Father        prostate   Valvular heart disease Mother        MVR   Thrombocytopenia Mother    Heart disease Mother    Birth defects Sister        tetralogy of falot   Hypertension Brother    Prostate cancer Brother    Stroke Maternal Grandmother    Hypertension Brother    Hypertension Brother    Colon cancer Neg Hx    Esophageal cancer Neg Hx    Stomach cancer Neg Hx    Rectal cancer Neg Hx    Allergies  Allergen Reactions   Fentanyl Nausea Only   Current Outpatient Medications  Medication Sig Dispense Refill   acetaminophen (TYLENOL) 500 MG tablet Take 500 mg by mouth every 6 (six) hours as needed.     estradiol (ESTRACE) 0.1 MG/GM vaginal cream Place 1 g vaginally 2 (two) times a week. 42.5 g 12   fluticasone (FLONASE) 50 MCG/ACT nasal spray Place 2 sprays into both nostrils daily. 16 g 6   hydrochlorothiazide (HYDRODIURIL) 12.5 MG tablet Take 1 tablet (12.5 mg total) by mouth daily. 90 tablet 3   meloxicam (MOBIC) 15 MG tablet Take 1 tablet (15 mg total) by mouth daily. 30 tablet 6   MULTIPLE VITAMIN PO Take 1 tablet by mouth daily.     tirzepatide (ZEPBOUND) 5 MG/0.5ML Pen Inject 5 mg into the skin once a week. Using Coupon  (Patient not taking: Reported on 02/16/2023) 6 mL 3   tirzepatide (ZEPBOUND) 7.5 MG/0.5ML Pen Inject 7.5 mg into the skin once a week. (Patient not taking: Reported on 02/16/2023) 6 mL 2   valACYclovir (VALTREX) 1000 MG tablet Take 1 tablet (1,000 mg total) by mouth daily. 90 tablet 4   valsartan (DIOVAN) 40 MG tablet Take 1/2  tablet (20 mg total) by mouth daily. 45 tablet 3   No current facility-administered medications for this visit.   No results found.  Review of Systems:   A ROS was performed including pertinent positives and negatives as documented in the HPI.  Physical Exam :   Constitutional: NAD and appears stated age Neurological: Alert and oriented Psych: Appropriate affect and cooperative Last menstrual period 09/19/2011.   Comprehensive Musculoskeletal Exam:    Tenderness to palpation about the pectoralis minor and coracoid process.  She has full active range of motion of the left shoulder with intact strength.  She does get numbness with direct palpation over the pectoralis minor.  Negative Spurling   Imaging:   Xray (3 views left shoulder): Normal     I personally reviewed and interpreted the radiographs.   Assessment and Plan:   62 y.o. adult with evidence of pectoralis minor syndrome.  I did describe that this is likely the cause of her radiating pain.  Given this I would like to send her for physical therapy for an aggressive anterior pectoralis mobilization program.  And posterior chain strengthening I do believe that with improvement of her scapular rhythm and protraction that this would ultimately improve her symptoms.  I will plan to see her back in 3 months for reassessment   I personally saw and evaluated the patient, and participated in the management and treatment plan.  Huel Cote, MD Attending Physician, Orthopedic Surgery  This document was dictated using Dragon voice recognition software. A reasonable attempt at proof reading has been made to  minimize errors.

## 2023-08-02 ENCOUNTER — Other Ambulatory Visit (HOSPITAL_COMMUNITY): Payer: Self-pay

## 2023-08-12 ENCOUNTER — Other Ambulatory Visit (HOSPITAL_COMMUNITY): Payer: Self-pay

## 2023-08-18 NOTE — Therapy (Signed)
OUTPATIENT PHYSICAL THERAPY SHOULDER EVALUATION   Patient Name: Sheila Nelson MRN: [id number] DOB:Feb 01, 1961, 62 y.o., adult Today's Date: 08/19/2023  END OF SESSION:  PT End of Session - 08/19/23 1020     Visit Number 1    Number of Visits 9    Date for PT Re-Evaluation 10/14/23    Authorization Type [employer]    PT Start Time 1017    PT Stop Time 1100    PT Time Calculation (min) 43 min    Activity Tolerance Patient tolerated treatment well;No increased pain    Behavior During Therapy WFL for tasks assessed/performed             Past Medical History:  Diagnosis Date   Allergy    Arthritis    feet and ankles   ASTHMA UNSPECIFIED WITH EXACERBATION    Borderline diabetes    Chest pain    a. 09/2015 felt to be non cardiac after LHC with no CAD    Fasciculation 08/04/2017   Gallstones    GERD (gastroesophageal reflux disease)    H/O varicella    Herpes    HTN (hypertension)    Hx of viral meningitis    Obesity    Pancreatitis    Viral   PLANTAR FASCIITIS, RIGHT 09/30/2010   PONV (postoperative nausea and vomiting)    PVC's (premature ventricular contractions)    REACTIVE AIRWAY DISEASE 10/09/2009   Seasonal allergies    TINNITUS 10/09/2009   Past Surgical History:  Procedure Laterality Date   APPENDECTOMY  1992   BREAST BIOPSY  12/29/2022   MM RT RADIOACTIVE SEED LOC MAMMO GUIDE 12/29/2022 [facility] MAMMOGRAP[facility]   BREAST LUMPECTOMY WITH RADIOACTIVE SEED LOCALIZATION Right 12/30/2022   Procedure: RIGHT BREAST LUMPECTOMY WITH RADIOACTIVE SEED LOCALIZATION;  Surgeon: Abigail Miyamoto, MD;  Location: [facility];  Service: General;  Laterality: Right;   CARDIAC CATHETERIZATION N/A 09/17/2015   Procedure: Left Heart Cath and Coronary Angiography;  Surgeon: Kathleene Hazel, MD;  Location: [facility] INVASIVE CV LAB;  Service: Cardiovascular;  Laterality: N/A;   CESAREAN SECTION  06/23/2012   Procedure: CESAREAN SECTION;  Surgeon: Lenoard Aden, MD;  Location: [facility];  Service: Obstetrics;  Laterality: N/A;   CHOLECYSTECTOMY N/A 10/05/2016   Procedure: LAPAROSCOPIC CHOLECYSTECTOMY;  Surgeon: Claud Kelp, MD;  Location: [facility];  Service: General;  Laterality: N/A;   DILATION AND CURETTAGE OF UTERUS  2001   INGUINAL HERNIA REPAIR     Infant, bilateral   MOUTH SURGERY     Right thigh surgery  1973   Trauma   Patient Active Problem List   Diagnosis Date Noted   Encounter for annual physical exam 03/25/2023   Pain in right knee 03/02/2023   Abdominal bruit 05-25-2022   Family history of abdominal aortic aneurysm (AAA) May 25, 2022   Family history of sudden cardiac death in sister 05/25/2022   Dysuria 01/29/2022   Pre-diabetes 01/29/2022   Left eye pain 04/02/2020   Essential hypertension    Obesity     PCP: Tollie Eth, NP   REFERRING PROVIDER: Huel Cote, MD   REFERRING DIAG: M25.512,G89.29 (ICD-10-CM) - Chronic left shoulder pain   THERAPY DIAG:  Acute pain of left shoulder  Muscle weakness (generalized)  Abnormal posture  Rationale for Evaluation and Treatment: Rehabilitation  ONSET DATE: 6 months ago  SUBJECTIVE:  SUBJECTIVE STATEMENT: I have had a few falls, I had a frozen shoulder of my Left shoulder 3 months ago  and about 6 months ago I started having numbness into my hand only palm side.  I had pain into my axilla and lats on Left side. I am having trouble dressing donning bra. 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 I am very active and I like to be in nature, fly fishing and hiking.  All my falls have been while I have been doing active things in nature. Hand dominance: Right  PERTINENT HISTORY: Asthma, borderline DM, plantar fascia resolved, right thigh surgery with impaled  object.as a child  PAIN:  Are you having pain? Yes: NPRS scale: left UE,  0/10  5/10 at worst Pain location: left UE in axilla, subscapular Pain description: tingling, nerve pain Aggravating factors: sleep on left side  sitting at desk for longer than 30 minutes and driving more than 16-10 minutes Relieving factors: stretching, standing and moving.  PRECAUTIONS: None  RED FLAGS: None   WEIGHT BEARING RESTRICTIONS: No  FALLS:  Has patient fallen in last 6 months? Yes. Number of falls 2-3 times due to activity wading in river fly fishing  always in activity.  LIVING ENVIRONMENT: Lives with: lives with their family Lives in: House/apartment Stairs:  no issues with stairs Has following equipment at home: None  OCCUPATION: Congregational nurse  PLOF: Independent  PATIENT GOALS:relieve pain and return to active lifestyle  NEXT MD VISIT:   OBJECTIVE:  Note: Objective measures were completed at Evaluation unless otherwise noted.  DIAGNOSTIC FINDINGS:  Narrative & Impression  CLINICAL DATA:  Chronic left shoulder pain.   EXAM: LEFT SHOULDER - 3 VIEW   COMPARISON:  None Available.   FINDINGS: There is no evidence of fracture or dislocation. There is no evidence of arthropathy or other focal bone abnormality. Soft tissues are unremarkable.   IMPRESSION: Negative.     Electronically Signed   By: Layla Maw M.D.   On: 07/30/2023 20:57   EXAM: CERVICAL SPINE - COMPLETE 4 VIEW   COMPARISON:  None Available.   FINDINGS: No fracture, dislocation or subluxation. No spondylolisthesis. No osteolytic or osteoblastic changes. Prevertebral and cervical cranial soft tissues are unremarkable. No motion with flexion and extension to suggest instability.   Degenerative disc disease noted with disc space narrowing and marginal osteophytes at C5-6.   IMPRESSION: Degenerative changes. No acute osseous abnormalities.     Electronically Signed   By: Layla Maw M.D.   On: 07/30/2023 20:58  PATIENT SURVEYS:  FOTO 56% predicted 68%  COGNITION: Overall cognitive status: Within functional limits for tasks assessed     SENSATION: Left arm numbness into palm of hand  POSTURE: Slight forward head and rounded shoulders  UPPER EXTREMITY ROM:   Active ROM Right eval Left eval  Shoulder flexion 155 A 160 P 150 A   159P feels pulling  Shoulder extension    Shoulder abduction A 140 153  Shoulder adduction    Shoulder internal rotation 51 60  Shoulder external rotation 90 90  Elbow flexion    Elbow extension    Wrist flexion    Wrist extension    Wrist ulnar deviation    Wrist radial deviation    Wrist pronation    Wrist supination    (Blank rows = not tested)  UPPER EXTREMITY MMT:  Grossly 4+/5  MMT Right eval Left eval  Shoulder flexion 4+ 4+  Shoulder extension    Shoulder abduction 4+ 4+  Shoulder adduction    Shoulder internal rotation    Shoulder external rotation    Middle trapezius 4 4  Lower trapezius 4 4  Elbow flexion    Elbow extension    Wrist flexion    Wrist extension    Wrist ulnar deviation    Wrist radial deviation    Wrist pronation    Wrist supination    Grip strength (lbs)    (Blank rows = not tested)  SHOULDER SPECIAL TESTS: NT    PALPATION:  TTP over  anterior pectoral, and over coracoid process, subscapular and lats on left   TODAY'S TREATMENT:                                                                                                                                         DATE: EVAL and issue of HEP and posture education   PATIENT EDUCATION: Education details: POC Explanation of findings, posture initial issue HEP Person educated: Patient Education method: Explanation, Demonstration, Tactile cues, Verbal cues, and Handouts Education comprehension: verbalized understanding, returned demonstration, verbal cues required, tactile cues required, and needs further  education  HOME EXERCISE PROGRAM: Access Code: [email or web address] URL: [email or web address] Date: 08/19/2023 Prepared by: Garen Lah  Program Notes Do soft tissue work as shown in clinic.  with Tax inspector  Exercises - Shoulder External Rotation and Scapular Retraction with Resistance  - 2 x daily - 7 x weekly - 3 sets - 10 reps - Shoulder Extension with Resistance - Palms Forward  - 1 x daily - 7 x weekly - 3 sets - 10 reps - Supine Cervical Retraction with Towel  - 1 x daily - 7 x weekly - 1 sets - 10 reps - Seated Levator Scapulae Stretch  - 1 x daily - 7 x weekly - 1 sets - 3 reps - 30 hold - Seated Cervical Sidebending Stretch  - 1 x daily - 7 x weekly - 1 sets - 3 reps - 30 hold - Doorway Pec Stretch at 90 Degrees Abduction  - 1 x daily - 7 x weekly - 3 sets - 10 reps  ASSESSMENT:  CLINICAL IMPRESSION: Patient is a 62 y.o. female who was seen today for physical therapy evaluation and treatment for pectoralis minor syndrome.  Symptoms of radiating pain and numbness into palm of hand are decreasing.  Pt does exhibit some decreased AROM in R shld as Left shld.  Pt has impaired motion effecting her ability to don clothing, sleep on left side and keeps her from sitting/driving longer than 30 minutes. Pt will benefit from skilled PT to address pain,decreased sensation and impairments.  OBJECTIVE IMPAIRMENTS: decreased activity tolerance, decreased ROM, decreased strength, impaired sensation, impaired UE functional use, postural dysfunction, and pain.   ACTIVITY LIMITATIONS: sitting, standing, sleeping, dressing, reach over head, and hygiene/grooming  PARTICIPATION  LIMITATIONS: meal prep, cleaning, driving, and occupation  PERSONAL FACTORS: Asthma, borderline DM are also affecting patient's functional outcome.   REHAB POTENTIAL: Excellent  CLINICAL DECISION MAKING: Stable/uncomplicated  EVALUATION COMPLEXITY: Low   GOALS: Goals reviewed with patient?  Yes  SHORT TERM GOALS: Target date: 09-16-23  Independent with initial HEP Baseline:no knowledge Goal status: INITIAL  2.  Demonstrate understanding of proper sitting posture, body mechanics, work ergonomics, and be more conscious of position and posture throughout the day Baseline:  Goal status: INITIAL  3.  Pt pain in Left shoulder decrease from 5/10 to 2/10 Baseline: at rest 0/10 and at worst 5/10 Goal status: INITIAL    LONG TERM GOALS: Target date: 10-14-23  Pt will be  I with advanced weight resistance exercise Baseline: no knowledge Goal status: INITIAL  2.  Pt will be able to don bra and clothes without exacerbating pain Baseline: unable to don bra at eval Goal status: INITIAL  3.  Pt will be able to sit at desk for work for 2 hours without exacerbating pain Baseline: Pt only able to sit for 30 to 40 minutes Goal status: INITIAL  4.  Pt will be able to sleep on left shoulder for 5 or more hours of uninterrupted sleep a night Baseline: unable to sleep on left shoulder Goal status: INITIAL  5.  FOTO will improve from 56%   to  68%   indicating improved functional mobility Baseline: eval 56% Goal status: INITIAL  6.  Pt will be able to lift 25 # above head to show improved functional use of UE in over to utilize equipment of outdoor activities Baseline: limiting lifting of left UE Goal status: INITIAL  PLAN:  PT FREQUENCY: 1x/week  PT DURATION: 8 weeks  PLANNED INTERVENTIONS: 97164- PT Re-evaluation, 97110-Therapeutic exercises, 97530- Therapeutic activity, [id number]- Neuromuscular re-education, 97535- Self Care, 46962- Manual therapy, [id number]- Electrical stimulation (manual), 97033- Ionotophoresis 4mg /ml Dexamethasone, Patient/Family education, Dry Needling, Joint mobilization, Spinal mobilization, Cryotherapy, and Moist heat  PLAN FOR NEXT SESSION: Possible TPDN and education on progressive resistance and exercise.  Garen Lah, PT, [facility] Certified Exercise  Expert for the Aging Adult  08/19/23 12:49 PM Phone: [phone] Fax: [phone]

## 2023-08-19 ENCOUNTER — Other Ambulatory Visit: Payer: Self-pay

## 2023-08-19 ENCOUNTER — Encounter: Payer: Self-pay | Admitting: Physical Therapy

## 2023-08-19 ENCOUNTER — Ambulatory Visit: Payer: 59 | Attending: Orthopaedic Surgery | Admitting: Physical Therapy

## 2023-08-19 DIAGNOSIS — M25512 Pain in left shoulder: Secondary | ICD-10-CM | POA: Insufficient documentation

## 2023-08-19 DIAGNOSIS — G8929 Other chronic pain: Secondary | ICD-10-CM | POA: Insufficient documentation

## 2023-08-19 DIAGNOSIS — R293 Abnormal posture: Secondary | ICD-10-CM | POA: Insufficient documentation

## 2023-08-19 DIAGNOSIS — M6281 Muscle weakness (generalized): Secondary | ICD-10-CM | POA: Insufficient documentation

## 2023-08-19 NOTE — Patient Instructions (Signed)
Posture Tips DO: - stand tall and erect - keep chin tucked in - keep head and shoulders in alignment - check posture regularly in mirror or large window - pull head back against headrest in car seat;  Change your position often.  Sit with lumbar support. DON'T: - slouch or slump while watching TV or reading - sit, stand or lie in one position  for too long;  Sitting is especially hard on the spine so if you sit at a desk/use the computer, then stand up often!   Copyright  VHI. All rights reserved.  Posture - Standing   Good posture is important. Avoid slouching and forward head thrust. Maintain curve in low back and align ears over shoul- ders, hips over ankles.  Pull your belly button in toward your back bone. Stand  with even weight in little toe, Great toe and heel.  With ribs lifted up and chin down.  Not military [place], or the Titanic!   Copyright  VHI. All rights reserved.  Posture - Sitting   Sit upright, head facing forward. Try using a roll to support lower back. Keep shoulders relaxed, and avoid rounded back. Keep hips level with knees. Avoid crossing legs for long periods. Do not sit on your tailbone  sit on sit bones   Copyright  VHI. All rights reserved.    Garen Lah, PT, [facility] Certified Exercise Expert for the Aging Adult  08/19/23 10:35 AM Phone: [phone] Fax: [phone]

## 2023-08-27 NOTE — Therapy (Addendum)
 OUTPATIENT PHYSICAL THERAPY SHOULDER TREATMENT/DISCHARGE NOTE PHYSICAL THERAPY DISCHARGE SUMMARY  Visits from Start of Care: 2  Current functional level related to goals / functional outcomes: unknown   Remaining deficits: unknown   Education / Equipment: Initial HEP   Patient agrees to discharge. Patient goals were partially met. Patient is being discharged due to not returning since the last visit. Only attended eval and one treatment and never returned    Patient Name: Sheila Nelson MRN: [id number] DOB:Jul 14, 1961, 62 y.o., adult Today's Date: 08/30/2023  END OF SESSION:  PT End of Session - 08/30/23 0921     Visit Number 2    Number of Visits 9    Date for PT Re-Evaluation 10/14/23    Authorization Type [place] [employer]    PT Start Time [phone]    PT Stop Time 1010    PT Time Calculation (min) 45 min    Activity Tolerance Patient tolerated treatment well;No increased pain    Behavior During Therapy WFL for tasks assessed/performed              Past Medical History:  Diagnosis Date   Allergy    Arthritis    feet and ankles   ASTHMA UNSPECIFIED WITH EXACERBATION    Borderline diabetes    Chest pain    a. 09/2015 felt to be non cardiac after LHC with no CAD    Fasciculation 08/04/2017   Gallstones    GERD (gastroesophageal reflux disease)    H/O varicella    Herpes    HTN (hypertension)    Hx of viral meningitis    Obesity    Pancreatitis    Viral   PLANTAR FASCIITIS, RIGHT 09/30/2010   PONV (postoperative nausea and vomiting)    PVC's (premature ventricular contractions)    REACTIVE AIRWAY DISEASE 10/09/2009   Seasonal allergies    TINNITUS 10/09/2009   Past Surgical History:  Procedure Laterality Date   APPENDECTOMY  1992   BREAST BIOPSY  12/29/2022   MM RT RADIOACTIVE SEED LOC MAMMO GUIDE 12/29/2022 [facility] MAMMOGRAPHY   BREAST LUMPECTOMY WITH RADIOACTIVE SEED LOCALIZATION Right 12/30/2022   Procedure: RIGHT BREAST LUMPECTOMY WITH  RADIOACTIVE SEED LOCALIZATION;  Surgeon: Abigail Miyamoto, MD;  Location: [facility];  Service: General;  Laterality: Right;   CARDIAC CATHETERIZATION N/A 09/17/2015   Procedure: Left Heart Cath and Coronary Angiography;  Surgeon: Kathleene Hazel, MD;  Location: [facility] INVASIVE CV LAB;  Service: Cardiovascular;  Laterality: N/A;   CESAREAN SECTION  06/23/2012   Procedure: CESAREAN SECTION;  Surgeon: Lenoard Aden, MD;  Location: [facility];  Service: Obstetrics;  Laterality: N/A;   CHOLECYSTECTOMY N/A 10/05/2016   Procedure: LAPAROSCOPIC CHOLECYSTECTOMY;  Surgeon: Claud Kelp, MD;  Location: [facility];  Service: General;  Laterality: N/A;   DILATION AND CURETTAGE OF UTERUS  2001   INGUINAL HERNIA REPAIR     Infant, bilateral   MOUTH SURGERY     Right thigh surgery  1973   Trauma   Patient Active Problem List   Diagnosis Date Noted   Encounter for annual physical exam 03/25/2023   Pain in right knee 03/02/2023   Abdominal bruit 05/15/2022   Family history of abdominal aortic aneurysm (AAA) 05/15/2022   Family history of sudden cardiac death in sister May 15, 2022   Dysuria 01/29/2022   Pre-diabetes 01/29/2022   Left eye pain 04/02/2020   Essential hypertension    Obesity     PCP: Tollie Eth, NP   REFERRING  PROVIDER: Huel Cote, MD   REFERRING DIAG: [phone] (ICD-10-CM) - Chronic left shoulder pain   THERAPY DIAG:  Acute pain of left shoulder  Muscle weakness (generalized)  Abnormal posture  Rationale for Evaluation and Treatment: Rehabilitation  ONSET DATE: 6 months ago  SUBJECTIVE:                                                                                                                                                                                      SUBJECTIVE STATEMENT:  08-30-23 I think not sitting at a desk has helped my neck pain. I have a [employer]  I am trying to use my left hand for fly fishing and to be ambidextrous.  No incidences of falling asleep with in my left hand.  0/10 pain today.  EVAL- I have had a few falls, I had a frozen shoulder of my Left shoulder 3 months ago  and about 6 months ago I started having numbness into my hand only palm side.  I had pain into my axilla and lats on Left side. I am having trouble dressing donning bra. 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 I am very active and I like to be in nature, fly fishing and hiking.  All my falls have been while I have been doing active things in nature. Hand dominance: Right  PERTINENT HISTORY: Asthma, borderline DM, plantar fascia resolved, right thigh surgery with impaled object.as a child  PAIN:  Are you having pain? Yes: NPRS scale: left UE,  0/10  5/10 at worst Pain location: left UE in axilla, subscapular Pain description: tingling, nerve pain Aggravating factors: sleep on left side  sitting at desk for longer than 30 minutes and driving more than 29-56 minutes Relieving factors: stretching, standing and moving.  PRECAUTIONS: None  RED FLAGS: None   WEIGHT BEARING RESTRICTIONS: No  FALLS:  Has patient fallen in last 6 months? Yes. Number of falls 2-3 times due to activity wading in river fly fishing  always in activity.  LIVING ENVIRONMENT: Lives with: lives with their family Lives in: House/apartment Stairs:  no issues with stairs Has following equipment at home: None  OCCUPATION: Congregational nurse  PLOF: Independent  PATIENT GOALS:relieve pain and return to active lifestyle  NEXT MD VISIT:   OBJECTIVE:  Note: Objective measures were completed at Evaluation unless otherwise noted.  DIAGNOSTIC FINDINGS:  Narrative & Impression  CLINICAL DATA:  Chronic left shoulder pain.   EXAM: LEFT SHOULDER - 3 VIEW   COMPARISON:  None Available.   FINDINGS: There is no evidence of fracture or dislocation. There is no evidence of arthropathy or other  focal bone abnormality.  Soft tissues are unremarkable.   IMPRESSION: Negative.     Electronically Signed   By: Layla Maw M.D.   On: 07/30/2023 20:57   EXAM: CERVICAL SPINE - COMPLETE 4 VIEW   COMPARISON:  None Available.   FINDINGS: No fracture, dislocation or subluxation. No spondylolisthesis. No osteolytic or osteoblastic changes. Prevertebral and cervical cranial soft tissues are unremarkable. No motion with flexion and extension to suggest instability.   Degenerative disc disease noted with disc space narrowing and marginal osteophytes at C5-6.   IMPRESSION: Degenerative changes. No acute osseous abnormalities.     Electronically Signed   By: Layla Maw M.D.   On: 07/30/2023 20:58  PATIENT SURVEYS:  FOTO 56% predicted 68%  COGNITION: Overall cognitive status: Within functional limits for tasks assessed     SENSATION: Left arm numbness into palm of hand  POSTURE: Slight forward head and rounded shoulders  UPPER EXTREMITY ROM:   Active ROM Right eval Left eval  Shoulder flexion 155 A 160 P 150 A   159P feels pulling  Shoulder extension    Shoulder abduction A 140 153  Shoulder adduction    Shoulder internal rotation 51 60  Shoulder external rotation 90 90  Elbow flexion    Elbow extension    Wrist flexion    Wrist extension    Wrist ulnar deviation    Wrist radial deviation    Wrist pronation    Wrist supination    (Blank rows = not tested)  UPPER EXTREMITY MMT:  Grossly 4+/5  MMT Right eval Left eval  Shoulder flexion 4+ 4+  Shoulder extension    Shoulder abduction 4+ 4+  Shoulder adduction    Shoulder internal rotation    Shoulder external rotation    Middle trapezius 4 4  Lower trapezius 4 4  Elbow flexion    Elbow extension    Wrist flexion    Wrist extension    Wrist ulnar deviation    Wrist radial deviation    Wrist pronation    Wrist supination    Grip strength (lbs)    (Blank rows = not tested)  SHOULDER SPECIAL  TESTS: NT    PALPATION:  TTP over  anterior pectoral, and over coracoid process, subscapular and lats on left   TODAY'S TREATMENT:    [facility] Adult PT Treatment:                                                DATE: 08-30-23 Therapeutic Exercise: Serratus bil with red tband and foam roller on wall.  2 x 10 Serratus bil overhead x 10 Kettlebell swings  15 # KB  3 x 10 Wall push up x 8 TRX inverted row  2 x 10 Star pattern blue t band 3 x 10 Planks from feet  19 sec hold,  90 sec rest,  24 sec hold, 90 sec rest, 30 sec Attempted side plank but not ready to perform  Self Care: Education about progressive overload and progressing exercises for strength,  community wellness opportunities  DATE: EVAL and issue of HEP and posture education   PATIENT EDUCATION: Education details: POC Explanation of findings, posture initial issue HEP Person educated: Patient Education method: Explanation, Demonstration, Tactile cues, Verbal cues, and Handouts Education comprehension: verbalized understanding, returned demonstration, verbal cues required, tactile cues required, and needs further education  HOME EXERCISE PROGRAM: Access Code: [email or web address] URL: [email or web address] Date: 08/19/2023 Prepared by: Garen Lah  Program Notes Do soft tissue work as shown in clinic.  with Tax inspector  Exercises - Shoulder External Rotation and Scapular Retraction with Resistance  - 2 x daily - 7 x weekly - 3 sets - 10 reps - Shoulder Extension with Resistance - Palms Forward  - 1 x daily - 7 x weekly - 3 sets - 10 reps - Supine Cervical Retraction with Towel  - 1 x daily - 7 x weekly - 1 sets - 10 reps - Seated Levator Scapulae Stretch  - 1 x daily - 7 x weekly - 1 sets - 3 reps - 30 hold - Seated Cervical Sidebending Stretch  - 1 x daily - 7 x  weekly - 1 sets - 3 reps - 30 hold - Doorway Pec Stretch at 90 Degrees Abduction  - 1 x daily - 7 x weekly - 3 sets - 10 reps Updated 08-30-23 Access Code: [email or web address] URL: [email or web address] Date: 08/30/2023 Prepared by: Garen Lah  Program Notes Wall pushups to start and move to counter.  Do soft tissue work as shown in clinic.  with Personnel officer at 90 Degrees Abduction  - 1 x daily - 7 x weekly - 3 sets - 10 reps - Shoulder External Rotation and Scapular Retraction with Resistance  - 2 x daily - 7 x weekly - 3 sets - 10 reps - Standing Shoulder Diagonal Horizontal Abduction 60/120 Degrees with Resistance  - 1 x daily - 7 x weekly - 3 sets - 10 reps - Standing Shoulder Horizontal Abduction with Resistance  - 1 x daily - 7 x weekly - 3 sets - 10 reps - Shoulder Extension with Resistance - Palms Forward  - 1 x daily - 7 x weekly - 3 sets - 10 reps - Supine Cervical Retraction with Towel  - 1 x daily - 7 x weekly - 1 sets - 10 reps - Seated Levator Scapulae Stretch  - 1 x daily - 7 x weekly - 1 sets - 3 reps - 30 hold - Seated Cervical Sidebending Stretch  - 1 x daily - 7 x weekly - 1 sets - 3 reps - 30 hold - Kettlebell Swing  - 1 x daily - 7 x weekly - 3-4 sets - 8-10 reps - 90 sec rest hold - Serratus Activation at Wall with Foam Roll and Resistance Band  - 1 x daily - 7 x weekly - 3 sets - 10 reps - Inverted Row with TRX  - 1 x daily - 7 x weekly - 3 sets - 10 reps - Wall Push Up  - 1 x daily - 7 x weekly - 1 sets - 10 reps - Standard Plank  - 1 x daily - 7 x weekly - 1 sets - 3-5 reps - 30 sec to 180 sec hold ASSESSMENT:  CLINICAL IMPRESSION: Pt returns to clinic with 0/10 pain and feeling better from pectoralis minor syndrome benefitting from self tissue mobilization of left pectoral muscles.  Pt interested in overall strengthening as well as shoulder to  return to fly fishing and active leasure activities.  Pt HEP  updated and given Blue tband for exercise and red t band of serratus exercises.  Education on progressive overload and how to progressively strengthen.   Pt also discussed community wellness opportunities and concentrate on what she can be most consistent. Pt left with all questions answered and no adverse effects.  Continue strengthening until DC and all goals achieved.  Goals next session.  STG # 1 and # 2 MET.    EVAL- Patient is a 62 y.o. female who was seen today for physical therapy evaluation and treatment for pectoralis minor syndrome.  Symptoms of radiating pain and numbness into palm of hand are decreasing.  Pt does exhibit some decreased AROM in R shld as Left shld.  Pt has impaired motion effecting her ability to don clothing, sleep on left side and keeps her from sitting/driving longer than 30 minutes. Pt will benefit from skilled PT to address pain,decreased sensation and impairments.  OBJECTIVE IMPAIRMENTS: decreased activity tolerance, decreased ROM, decreased strength, impaired sensation, impaired UE functional use, postural dysfunction, and pain.   ACTIVITY LIMITATIONS: sitting, standing, sleeping, dressing, reach over head, and hygiene/grooming  PARTICIPATION LIMITATIONS: meal prep, cleaning, driving, and occupation  PERSONAL FACTORS: Asthma, borderline DM are also affecting patient's functional outcome.   REHAB POTENTIAL: Excellent  CLINICAL DECISION MAKING: Stable/uncomplicated  EVALUATION COMPLEXITY: Low   GOALS: Goals reviewed with patient? Yes  SHORT TERM GOALS: Target date: 09-16-23  Independent with initial HEP Baseline:no knowledge Goal status: MET  2.  Demonstrate understanding of proper sitting posture, body mechanics, work ergonomics, and be more conscious of position and posture throughout the day Baseline:  Goal status: INITIAL  3.  Pt pain in Left shoulder decrease from 5/10 to 2/10 Baseline: at rest 0/10 and at worst 5/10 08-30-23  0/10 pain with  exercise Goal status: MET    LONG TERM GOALS: Target date: 10-14-23  Pt will be  I with advanced weight resistance exercise Baseline: no knowledge Goal status: INITIAL  2.  Pt will be able to don bra and clothes without exacerbating pain Baseline: unable to don bra at eval Goal status: INITIAL  3.  Pt will be able to sit at desk for work for 2 hours without exacerbating pain Baseline: Pt only able to sit for 30 to 40 minutes Goal status: INITIAL  4.  Pt will be able to sleep on left shoulder for 5 or more hours of uninterrupted sleep a night Baseline: unable to sleep on left shoulder Goal status: INITIAL  5.  FOTO will improve from 56%   to  68%   indicating improved functional mobility Baseline: eval 56% Goal status: INITIAL  6.  Pt will be able to lift 25 # above head to show improved functional use of UE in over to utilize equipment of outdoor activities Baseline: limiting lifting of left UE Goal status: INITIAL  PLAN:  PT FREQUENCY: 1x/week  PT DURATION: 8 weeks  PLANNED INTERVENTIONS: 97164- PT Re-evaluation, 97110-Therapeutic exercises, 97530- Therapeutic activity, [id number]- Neuromuscular re-education, 97535- Self Care, 16109- Manual therapy, [id number]- Electrical stimulation (manual), [id number]- Ionotophoresis 4mg /ml Dexamethasone, Patient/Family education, Dry Needling, Joint mobilization, Spinal mobilization, Cryotherapy, and Moist heat  PLAN FOR NEXT SESSION: Possible TPDN and education on progressive resistance and exercise.  Garen Lah, PT, ATR[facility] Certified Exercise Expert for the Aging Adult  08/30/23 10:22 AM Phone: [phone] Fax: [phone]   Garen Lah, PT, ATRIC Certified Exercise Expert for the Aging Adult  11/03/23 3:04 PM Phone: [phone] Fax: [phone]

## 2023-08-30 ENCOUNTER — Ambulatory Visit: Payer: 59 | Admitting: Physical Therapy

## 2023-08-30 ENCOUNTER — Encounter: Payer: Self-pay | Admitting: Physical Therapy

## 2023-08-30 DIAGNOSIS — R293 Abnormal posture: Secondary | ICD-10-CM

## 2023-08-30 DIAGNOSIS — M6281 Muscle weakness (generalized): Secondary | ICD-10-CM

## 2023-08-30 DIAGNOSIS — G8929 Other chronic pain: Secondary | ICD-10-CM | POA: Diagnosis not present

## 2023-08-30 DIAGNOSIS — M25512 Pain in left shoulder: Secondary | ICD-10-CM | POA: Diagnosis not present

## 2023-09-10 ENCOUNTER — Ambulatory Visit: Payer: 59 | Admitting: Physical Therapy

## 2023-09-17 ENCOUNTER — Ambulatory Visit: Payer: 59 | Admitting: Physical Therapy

## 2023-09-19 ENCOUNTER — Other Ambulatory Visit: Payer: Self-pay

## 2023-09-27 ENCOUNTER — Other Ambulatory Visit (HOSPITAL_BASED_OUTPATIENT_CLINIC_OR_DEPARTMENT_OTHER): Payer: Self-pay

## 2023-09-27 ENCOUNTER — Other Ambulatory Visit (HOSPITAL_COMMUNITY): Payer: Self-pay

## 2023-10-28 ENCOUNTER — Ambulatory Visit (HOSPITAL_BASED_OUTPATIENT_CLINIC_OR_DEPARTMENT_OTHER): Payer: 59 | Admitting: Orthopaedic Surgery

## 2023-11-15 ENCOUNTER — Other Ambulatory Visit (HOSPITAL_BASED_OUTPATIENT_CLINIC_OR_DEPARTMENT_OTHER): Payer: Self-pay | Admitting: Nurse Practitioner

## 2023-11-15 ENCOUNTER — Other Ambulatory Visit (HOSPITAL_COMMUNITY): Payer: Self-pay

## 2023-11-15 MED ORDER — FLUTICASONE PROPIONATE 50 MCG/ACT NA SUSP
2.0000 | Freq: Every day | NASAL | 1 refills | Status: DC
  Filled 2023-11-15: qty 16, 30d supply, fill #0
  Filled 2023-12-18: qty 16, 30d supply, fill #1

## 2023-11-24 ENCOUNTER — Other Ambulatory Visit (HOSPITAL_COMMUNITY): Payer: Self-pay

## 2023-12-27 ENCOUNTER — Other Ambulatory Visit (HOSPITAL_COMMUNITY): Payer: Self-pay

## 2024-01-17 ENCOUNTER — Other Ambulatory Visit: Payer: Self-pay | Admitting: Nurse Practitioner

## 2024-01-17 ENCOUNTER — Other Ambulatory Visit (HOSPITAL_BASED_OUTPATIENT_CLINIC_OR_DEPARTMENT_OTHER): Payer: Self-pay | Admitting: Nurse Practitioner

## 2024-01-17 ENCOUNTER — Other Ambulatory Visit (HOSPITAL_COMMUNITY): Payer: Self-pay

## 2024-01-17 DIAGNOSIS — G8929 Other chronic pain: Secondary | ICD-10-CM

## 2024-01-18 ENCOUNTER — Other Ambulatory Visit: Payer: Self-pay

## 2024-01-18 ENCOUNTER — Other Ambulatory Visit (HOSPITAL_COMMUNITY): Payer: Self-pay

## 2024-01-18 MED ORDER — MELOXICAM 15 MG PO TABS
15.0000 mg | ORAL_TABLET | Freq: Every day | ORAL | 6 refills | Status: DC
Start: 2024-01-18 — End: 2024-06-21
  Filled 2024-01-18: qty 30, 30d supply, fill #0
  Filled 2024-04-27: qty 30, 30d supply, fill #1

## 2024-01-18 MED ORDER — FLUTICASONE PROPIONATE 50 MCG/ACT NA SUSP
2.0000 | Freq: Every day | NASAL | 1 refills | Status: DC
  Filled 2024-01-18: qty 16, 30d supply, fill #0
  Filled 2024-04-27: qty 16, 30d supply, fill #1

## 2024-01-18 NOTE — Telephone Encounter (Signed)
 Last apt. 02/12/23 next apt 04/03/24.

## 2024-01-28 DIAGNOSIS — F419 Anxiety disorder, unspecified: Secondary | ICD-10-CM | POA: Diagnosis not present

## 2024-02-21 ENCOUNTER — Other Ambulatory Visit: Payer: Self-pay | Admitting: Nurse Practitioner

## 2024-02-21 ENCOUNTER — Other Ambulatory Visit (HOSPITAL_COMMUNITY): Payer: Self-pay

## 2024-02-21 DIAGNOSIS — R7303 Prediabetes: Secondary | ICD-10-CM

## 2024-02-21 DIAGNOSIS — E66811 Obesity, class 1: Secondary | ICD-10-CM

## 2024-02-21 DIAGNOSIS — Z8241 Family history of sudden cardiac death: Secondary | ICD-10-CM

## 2024-02-21 DIAGNOSIS — Z8249 Family history of ischemic heart disease and other diseases of the circulatory system: Secondary | ICD-10-CM

## 2024-02-21 DIAGNOSIS — I10 Essential (primary) hypertension: Secondary | ICD-10-CM

## 2024-02-21 MED ORDER — ZEPBOUND 7.5 MG/0.5ML ~~LOC~~ SOAJ
7.5000 mg | SUBCUTANEOUS | 2 refills | Status: DC
Start: 2024-02-21 — End: 2024-04-03
  Filled 2024-02-21: qty 2, 28d supply, fill #0
  Filled 2024-03-21: qty 2, 28d supply, fill #1

## 2024-02-25 DIAGNOSIS — H5712 Ocular pain, left eye: Secondary | ICD-10-CM | POA: Diagnosis not present

## 2024-02-29 ENCOUNTER — Telehealth: Payer: Self-pay | Admitting: *Deleted

## 2024-02-29 DIAGNOSIS — E7800 Pure hypercholesterolemia, unspecified: Secondary | ICD-10-CM

## 2024-02-29 DIAGNOSIS — Z79890 Hormone replacement therapy (postmenopausal): Secondary | ICD-10-CM

## 2024-02-29 DIAGNOSIS — R7303 Prediabetes: Secondary | ICD-10-CM

## 2024-02-29 DIAGNOSIS — I10 Essential (primary) hypertension: Secondary | ICD-10-CM

## 2024-02-29 DIAGNOSIS — E6609 Other obesity due to excess calories: Secondary | ICD-10-CM

## 2024-02-29 NOTE — Telephone Encounter (Signed)
 Lab orders in. OK to schedule at patients convenience.

## 2024-02-29 NOTE — Telephone Encounter (Signed)
 Copied from CRM [id number]. Topic: Clinical - Request for Lab/Test Order >> Feb 29, 2024 11:58 AM Graeme ORN wrote: Reason for CRM: Patient called> Returned missed call for appt. Appt for Physical; 8/4. Patient wanted to know if she could come in a different day in the morning to get here labs since she has to fast for labs. No order in yet for new labs. Thank You  Needs future orders placed and I can schedule NV.

## 2024-03-01 NOTE — Telephone Encounter (Signed)
 Appt scheduled

## 2024-03-01 NOTE — Telephone Encounter (Signed)
 Please schedule patient for lab visit.

## 2024-03-21 ENCOUNTER — Other Ambulatory Visit (HOSPITAL_COMMUNITY): Payer: Self-pay

## 2024-03-21 ENCOUNTER — Other Ambulatory Visit: Payer: Self-pay | Admitting: Nurse Practitioner

## 2024-03-21 DIAGNOSIS — R7303 Prediabetes: Secondary | ICD-10-CM

## 2024-03-21 DIAGNOSIS — Z0000 Encounter for general adult medical examination without abnormal findings: Secondary | ICD-10-CM

## 2024-03-21 DIAGNOSIS — E66811 Obesity, class 1: Secondary | ICD-10-CM

## 2024-03-21 DIAGNOSIS — I10 Essential (primary) hypertension: Secondary | ICD-10-CM

## 2024-03-21 MED ORDER — VALSARTAN 40 MG PO TABS
20.0000 mg | ORAL_TABLET | Freq: Every day | ORAL | 1 refills | Status: DC
Start: 2024-03-21 — End: 2024-04-03
  Filled 2024-03-21: qty 45, 90d supply, fill #0

## 2024-03-21 MED ORDER — HYDROCHLOROTHIAZIDE 12.5 MG PO TABS
12.5000 mg | ORAL_TABLET | Freq: Every day | ORAL | 1 refills | Status: AC
Start: 2024-03-21 — End: ?
  Filled 2024-03-21: qty 90, 90d supply, fill #0
  Filled 2024-06-26: qty 90, 90d supply, fill #1

## 2024-03-31 ENCOUNTER — Other Ambulatory Visit

## 2024-03-31 DIAGNOSIS — E6609 Other obesity due to excess calories: Secondary | ICD-10-CM | POA: Diagnosis not present

## 2024-03-31 DIAGNOSIS — Z79890 Hormone replacement therapy (postmenopausal): Secondary | ICD-10-CM

## 2024-03-31 DIAGNOSIS — R7303 Prediabetes: Secondary | ICD-10-CM | POA: Diagnosis not present

## 2024-03-31 DIAGNOSIS — E66811 Obesity, class 1: Secondary | ICD-10-CM | POA: Diagnosis not present

## 2024-03-31 DIAGNOSIS — I10 Essential (primary) hypertension: Secondary | ICD-10-CM

## 2024-03-31 DIAGNOSIS — Z6834 Body mass index (BMI) 34.0-34.9, adult: Secondary | ICD-10-CM | POA: Diagnosis not present

## 2024-03-31 DIAGNOSIS — E7800 Pure hypercholesterolemia, unspecified: Secondary | ICD-10-CM

## 2024-03-31 LAB — LIPID PANEL

## 2024-03-31 NOTE — Progress Notes (Signed)
 T-dap: Pneumonia: Shingrix: Colonoscopy:

## 2024-04-01 LAB — CBC WITH DIFFERENTIAL/PLATELET
Basophils Absolute: 0 x10E3/uL (ref 0.0–0.2)
Basos: 0 %
EOS (ABSOLUTE): 0.1 x10E3/uL (ref 0.0–0.4)
Eos: 3 %
Hematocrit: 41.5 % (ref 34.0–46.6)
Hemoglobin: 13.9 g/dL (ref 11.1–15.9)
Immature Grans (Abs): 0 x10E3/uL (ref 0.0–0.1)
Immature Granulocytes: 0 %
Lymphocytes Absolute: 1.9 x10E3/uL (ref 0.7–3.1)
Lymphs: 41 %
MCH: 32.4 pg (ref 26.6–33.0)
MCHC: 33.5 g/dL (ref 31.5–35.7)
MCV: 97 fL (ref 79–97)
Monocytes Absolute: 0.5 x10E3/uL (ref 0.1–0.9)
Monocytes: 10 %
Neutrophils Absolute: 2.2 x10E3/uL (ref 1.4–7.0)
Neutrophils: 46 %
Platelets: 235 x10E3/uL (ref 150–450)
RBC: 4.29 x10E6/uL (ref 3.77–5.28)
RDW: 13 % (ref 11.7–15.4)
WBC: 4.7 x10E3/uL (ref 3.4–10.8)

## 2024-04-01 LAB — COMPREHENSIVE METABOLIC PANEL WITH GFR
ALT: 13 IU/L (ref 0–32)
AST: 19 IU/L (ref 0–40)
Albumin: 4.5 g/dL (ref 3.8–4.9)
Alkaline Phosphatase: 99 IU/L (ref 44–121)
BUN/Creatinine Ratio: 18 (ref 12–28)
BUN: 14 mg/dL (ref 8–27)
Bilirubin Total: 0.8 mg/dL (ref 0.0–1.2)
CO2: 20 mmol/L (ref 20–29)
Calcium: 9.6 mg/dL (ref 8.7–10.3)
Chloride: 100 mmol/L (ref 96–106)
Creatinine, Ser: 0.78 mg/dL (ref 0.57–1.00)
Globulin, Total: 2.3 g/dL (ref 1.5–4.5)
Glucose: 90 mg/dL (ref 70–99)
Potassium: 4.2 mmol/L (ref 3.5–5.2)
Sodium: 136 mmol/L (ref 134–144)
Total Protein: 6.8 g/dL (ref 6.0–8.5)
eGFR: 87 mL/min/1.73 (ref >59)

## 2024-04-01 LAB — HEMOGLOBIN A1C
Est. average glucose Bld gHb Est-mCnc: 105 mg/dL
Hgb A1c MFr Bld: 5.3 % (ref 4.8–5.6)

## 2024-04-01 LAB — ESTRADIOL: Estradiol: <5 pg/mL (ref 0.0–54.7)

## 2024-04-01 LAB — LIPID PANEL
Chol/HDL Ratio: 2.7 ratio (ref 0.0–4.4)
Cholesterol, Total: 226 mg/dL — ABNORMAL HIGH (ref 100–199)
HDL: 85 mg/dL (ref >39)
LDL Chol Calc (NIH): 121 mg/dL — ABNORMAL HIGH (ref 0–99)
Triglycerides: 115 mg/dL (ref 0–149)
VLDL Cholesterol Cal: 20 mg/dL (ref 5–40)

## 2024-04-03 ENCOUNTER — Other Ambulatory Visit (HOSPITAL_COMMUNITY): Payer: Self-pay

## 2024-04-03 ENCOUNTER — Encounter: Payer: Self-pay | Admitting: Nurse Practitioner

## 2024-04-03 ENCOUNTER — Ambulatory Visit: Admitting: Nurse Practitioner

## 2024-04-03 VITALS — BP 118/76 | HR 76 | Ht 64.0 in | Wt 174.8 lb

## 2024-04-03 DIAGNOSIS — L299 Pruritus, unspecified: Secondary | ICD-10-CM

## 2024-04-03 DIAGNOSIS — Z0000 Encounter for general adult medical examination without abnormal findings: Secondary | ICD-10-CM

## 2024-04-03 DIAGNOSIS — E66811 Obesity, class 1: Secondary | ICD-10-CM | POA: Diagnosis not present

## 2024-04-03 DIAGNOSIS — E782 Mixed hyperlipidemia: Secondary | ICD-10-CM

## 2024-04-03 DIAGNOSIS — R7303 Prediabetes: Secondary | ICD-10-CM

## 2024-04-03 DIAGNOSIS — Z780 Asymptomatic menopausal state: Secondary | ICD-10-CM

## 2024-04-03 DIAGNOSIS — H9193 Unspecified hearing loss, bilateral: Secondary | ICD-10-CM | POA: Diagnosis not present

## 2024-04-03 DIAGNOSIS — N941 Unspecified dyspareunia: Secondary | ICD-10-CM | POA: Insufficient documentation

## 2024-04-03 DIAGNOSIS — E6609 Other obesity due to excess calories: Secondary | ICD-10-CM

## 2024-04-03 DIAGNOSIS — I10 Essential (primary) hypertension: Secondary | ICD-10-CM

## 2024-04-03 DIAGNOSIS — Z6831 Body mass index (BMI) 31.0-31.9, adult: Secondary | ICD-10-CM

## 2024-04-03 DIAGNOSIS — Z1231 Encounter for screening mammogram for malignant neoplasm of breast: Secondary | ICD-10-CM

## 2024-04-03 MED ORDER — TIRZEPATIDE-WEIGHT MANAGEMENT 10 MG/0.5ML ~~LOC~~ SOLN: 10.0000 mg | SUBCUTANEOUS | 2 refills | Status: DC

## 2024-04-03 MED ORDER — ESTRADIOL-PROGESTERONE 0.5-100 MG PO CAPS
1.0000 | ORAL_CAPSULE | Freq: Every day | ORAL | 2 refills | Status: DC
Start: 2024-04-03 — End: 2024-07-04
  Filled 2024-04-03: qty 30, 30d supply, fill #0
  Filled 2024-04-27: qty 30, 30d supply, fill #1
  Filled 2024-05-31: qty 30, 30d supply, fill #2

## 2024-04-03 NOTE — Assessment & Plan Note (Signed)
 Blood pressure readings at home are low, suggesting possible overmedication. Considering discontinuation of valsartan  due to low blood pressure and prediabetes status. - Discontinue valsartan . - Monitor blood pressure regularly and report any significant changes.

## 2024-04-03 NOTE — Assessment & Plan Note (Signed)
 Experiencing menopausal symptoms including joint pain, decreased libido, and dyspareunia. Reports deep itching, not typically associated with menopause or hormone changes. Considering HRT to improve quality of life. Discussed HRT risks, including cardiovascular and breast cancer risks. Prefers oral HRT. - Prescribe low-dose oral estrogen and progesterone  therapy. - Advise to report any vaginal bleeding immediately. - Monitor for any signs of heart disease, stroke, high blood pressure, or blood clots.

## 2024-04-03 NOTE — Patient Instructions (Addendum)
 We will start the progesterone  and estrogen replacement in pill form. I will send these into the pharmacy for you.   I have sent the 10mg  Tirzepatide  to [facility]. They will contact you to get this set up.   Stop the Valsartan  and keep an eye on your blood pressures.   I have sent the referral for the hearing evaluation for you. That office will call you to schedule.   Your labs look fabulous!! I am so proud of you!

## 2024-04-03 NOTE — Assessment & Plan Note (Signed)
 Reports deep, generalized itching without rash, possibly related to hormonal changes or environmental factors. Continuing cetirizine for symptom management. Monitoring response to hormone therapy for impact on itching. - Continue cetirizine. - Monitor response to hormone therapy for potential improvement in itching.

## 2024-04-03 NOTE — Progress Notes (Signed)
 Catheline Doing, DNP, AGNP-c [facility] [address] [place], [place] [address] Main Office [phone]  BP 118/76   Pulse 76   Ht 5' 4 (1.626 m)   Wt 174 lb 12.8 oz (79.3 kg)   LMP 09/19/2011   BMI 30.00 kg/m    Subjective:    Patient ID: Sheila Nelson, female    DOB: 11-16-1960, 63 y.o.   MRN: [id number]  HPI: History of Present Illness Sheila Nelson is a 63 year old female with hypertension and prediabetes who presents for her annual exam. She also has concerns about orthostatic hypertension and menopausal symptoms.  She experiences orthostatic hypotension with systolic blood pressure readings at home between 105 and 110 mmHg. She questions whether she could discontinue her blood pressure medication, specifically valsartan , which she currently takes at a dose of 20 mg. She attributes some of her blood pressure control to weight loss achieved with the medication Zepbound , which she takes at a dose of 7.5 mg.  She has experienced significant weight loss over the summer, with her weight reaching as low as 168 pounds. However, she notes fluctuations due to vacations and dietary changes. Her goal weight is 155 pounds. She has been on the current dose of Zepbound  for at least six months and is tolerating it well, despite occasional constipation and sensitivity to high-fat meals.  She reports menopausal symptoms, including joint pain, decreased libido, and painful intercourse despite using lubricants. She experiences deep, all-over body itching, which she describes as 'coming from the depths,' and wonders if it is related to estrogen deficiency. She uses estrogen suppositories but finds them inconvenient. No recent UTIs. She also reports fluctuations in body temperature, particularly at night, and occasional swelling in her calves, which she attributes to dietary sodium intake.  She has a history of elevated cholesterol, which has increased slightly since her last  check but remains lower than before starting Zepbound . Her A1c is 5.3, which is an improvement from previous levels. She is concerned about her estrogen levels, which have decreased.  She has a history of herpetic eye infections, particularly in her left eye, causing significant discomfort and photosensitivity. She manages this with Valtrex , initially reducing her dose to 500 mg but increasing it back to 1 gram daily due to flare-ups. An ophthalmologist advised that she could increase the dose to 3 grams per day during severe flare-ups.  She reports some hearing changes, particularly difficulty understanding speech in noisy environments, and experiences intermittent tinnitus. She also mentions vision changes, including difficulty driving at night and the presence of 'baby cataracts' as noted by her optometrist.  Her family history includes a brother with an aortic aneurysm, which is being monitored.  Pertinent items are noted in HPI.   Most Recent Depression Screen:     04/03/2024    1:27 PM 05/05/2022    8:26 AM 01/29/2022    8:15 AM 04/18/2018    8:37 AM 01/08/2018    8:56 AM  Depression screen PHQ 2/9  Decreased Interest 0 0 1 0 0  Down, Depressed, Hopeless 0 0 0 0 0  PHQ - 2 Score 0 0 1 0 0  Altered sleeping  0 0    Tired, decreased energy  0 1    Change in appetite  0 1    Feeling bad or failure about yourself   0 0    Trouble concentrating  0 1    Moving slowly or fidgety/restless  0 0  Suicidal thoughts  0 0    PHQ-9 Score  0 4    Difficult doing work/chores  Not difficult at all Somewhat difficult     Most Recent Anxiety Screen:      No data to display         Most Recent Fall Screen:    04/03/2024    1:27 PM 02/16/2023    9:29 AM 10/06/2022    8:23 AM 05/05/2022    8:26 AM 02/27/2022    8:41 AM  Fall Risk   Falls in the past year? 0 0 0 0 0  Number falls in past yr: 0 0 0 0 0  Injury with Fall? 0 0 0 0 0  Risk for fall due to : No Fall Risks No Fall Risks No Fall Risks  No Fall Risks No Fall Risks  Follow up Falls evaluation completed Falls evaluation completed Falls evaluation completed Falls evaluation completed  Falls evaluation completed;Education provided      Data saved with a previous flowsheet row definition    Past medical history, surgical history, medications, allergies, family history and social history reviewed with patient today and changes made to appropriate areas of the chart.  Past Medical History:  Past Medical History:  Diagnosis Date   Allergy    Arthritis    feet and ankles   ASTHMA UNSPECIFIED WITH EXACERBATION    Borderline diabetes    Chest pain    a. 09/2015 felt to be non cardiac after LHC with no CAD    Fasciculation 08/04/2017   Female dyspareunia 04/03/2024   Gallstones    GERD (gastroesophageal reflux disease)    H/O varicella    Herpes    HTN (hypertension)    Hx of viral meningitis    Left eye pain 04/02/2020   Obesity    Pancreatitis    Viral   PLANTAR FASCIITIS, RIGHT 09/30/2010   PONV (postoperative nausea and vomiting)    PVC's (premature ventricular contractions)    REACTIVE AIRWAY DISEASE 10/09/2009   Seasonal allergies    TINNITUS 10/09/2009   Medications:  Current Outpatient Medications on File Prior to Visit  Medication Sig   acetaminophen  (TYLENOL ) 500 MG tablet Take 500 mg by mouth every 6 (six) hours as needed.   fluticasone  (FLONASE ) 50 MCG/ACT nasal spray Place 2 sprays into both nostrils daily.   hydrochlorothiazide  (HYDRODIURIL ) 12.5 MG tablet Take 1 tablet (12.5 mg total) by mouth daily.   meloxicam  (MOBIC ) 15 MG tablet Take 1 tablet (15 mg total) by mouth daily.   MULTIPLE VITAMIN PO Take 1 tablet by mouth daily.   valACYclovir  (VALTREX ) 1000 MG tablet Take 1 tablet (1,000 mg total) by mouth daily.   [DISCONTINUED] beclomethasone (QVAR ) 80 MCG/ACT inhaler Inhale 2 puffs into the lungs 2 (two) times daily.   No current facility-administered medications on file prior to visit.    Surgical History:  Past Surgical History:  Procedure Laterality Date   APPENDECTOMY  1992   BREAST BIOPSY  12/29/2022   MM RT RADIOACTIVE SEED LOC MAMMO GUIDE 12/29/2022 [facility] MAMMOGRAPHY   BREAST LUMPECTOMY WITH RADIOACTIVE SEED LOCALIZATION Right 12/30/2022   Procedure: RIGHT BREAST LUMPECTOMY WITH RADIOACTIVE SEED LOCALIZATION;  Surgeon: Vernetta Berg, MD;  Location: [facility];  Service: General;  Laterality: Right;   CARDIAC CATHETERIZATION N/A 09/17/2015   Procedure: Left Heart Cath and Coronary Angiography;  Surgeon: Lonni JONETTA Cash, MD;  Location: [facility] INVASIVE CV LAB;  Service: Cardiovascular;  Laterality: N/A;  CESAREAN SECTION  06/23/2012   Procedure: CESAREAN SECTION;  Surgeon: Charlie JINNY Flowers, MD;  Location: [facility];  Service: Obstetrics;  Laterality: N/A;   CHOLECYSTECTOMY N/A 10/05/2016   Procedure: LAPAROSCOPIC CHOLECYSTECTOMY;  Surgeon: Elon Pacini, MD;  Location: [facility];  Service: General;  Laterality: N/A;   DILATION AND CURETTAGE OF UTERUS  2001   INGUINAL HERNIA REPAIR     Infant, bilateral   MOUTH SURGERY     Right thigh surgery  1973   Trauma   Allergies:  Allergies  Allergen Reactions   Fentanyl  Nausea Only   Family History:  Family History  Problem Relation Age of Onset   Prostate cancer Father    Stroke Father 47   Heart failure Father    Hypertension Father    Cancer Father        prostate   Valvular heart disease Mother        MVR   Thrombocytopenia Mother    Heart disease Mother    Birth defects Sister        tetralogy of falot   Hypertension Brother    Prostate cancer Brother    Stroke Maternal Grandmother    Hypertension Brother    Hypertension Brother    Colon cancer Neg Hx    Esophageal cancer Neg Hx    Stomach cancer Neg Hx    Rectal cancer Neg Hx        Objective:    BP 118/76   Pulse 76   Ht 5' 4 (1.626 m)   Wt 174 lb 12.8 oz (79.3 kg)   LMP 09/19/2011   BMI 30.00 kg/m   Wt Readings from Last  3 Encounters:  04/03/24 174 lb 12.8 oz (79.3 kg)  02/16/23 186 lb 9.6 oz (84.6 kg)  12/30/22 187 lb 6.3 oz (85 kg)    Physical Exam Vitals and nursing note reviewed.  Constitutional:      General: She is not in acute distress.    Appearance: Normal appearance.  HENT:     Head: Normocephalic and atraumatic.     Right Ear: Hearing, tympanic membrane, ear canal and external ear normal.     Left Ear: Hearing, tympanic membrane, ear canal and external ear normal.     Nose: Nose normal.     Right Sinus: No maxillary sinus tenderness or frontal sinus tenderness.     Left Sinus: No maxillary sinus tenderness or frontal sinus tenderness.     Mouth/Throat:     Lips: Pink.     Mouth: Mucous membranes are moist.     Pharynx: Oropharynx is clear.  Eyes:     General: Lids are normal. Vision grossly intact.     Extraocular Movements: Extraocular movements intact.     Conjunctiva/sclera: Conjunctivae normal.     Pupils: Pupils are equal, round, and reactive to light.     Funduscopic exam:    Right eye: Red reflex present.        Left eye: Red reflex present.    Visual Fields: Right eye visual fields normal and left eye visual fields normal.  Neck:     Thyroid : No thyromegaly.     Vascular: No carotid bruit.  Cardiovascular:     Rate and Rhythm: Normal rate and regular rhythm.     Chest Wall: PMI is not displaced.     Pulses: Normal pulses.          Dorsalis pedis pulses are 2+ on the right side and 2+  on the left side.       Posterior tibial pulses are 2+ on the right side and 2+ on the left side.     Heart sounds: Normal heart sounds. No murmur heard. Pulmonary:     Effort: Pulmonary effort is normal. No respiratory distress.     Breath sounds: Normal breath sounds.  Abdominal:     General: Abdomen is flat. Bowel sounds are normal. There is no distension.     Palpations: Abdomen is soft. There is no hepatomegaly, splenomegaly or mass.     Tenderness: There is no abdominal tenderness.  There is no right CVA tenderness, left CVA tenderness, guarding or rebound.  Musculoskeletal:        General: Normal range of motion.     Cervical back: Full passive range of motion without pain, normal range of motion and neck supple. No tenderness.     Right lower leg: No edema.     Left lower leg: No edema.  Feet:     Left foot:     Toenail Condition: Left toenails are normal.  Lymphadenopathy:     Cervical: No cervical adenopathy.     Upper Body:     Right upper body: No supraclavicular adenopathy.     Left upper body: No supraclavicular adenopathy.  Skin:    General: Skin is warm and dry.     Capillary Refill: Capillary refill takes less than 2 seconds.     Nails: There is no clubbing.  Neurological:     General: No focal deficit present.     Mental Status: She is alert and oriented to person, place, and time.     GCS: GCS eye subscore is 4. GCS verbal subscore is 5. GCS motor subscore is 6.     Sensory: Sensation is intact.     Motor: Motor function is intact.     Coordination: Coordination is intact.     Gait: Gait is intact.     Deep Tendon Reflexes: Reflexes are normal and symmetric.  Psychiatric:        Attention and Perception: Attention normal.        Mood and Affect: Mood normal.        Speech: Speech normal.        Behavior: Behavior normal. Behavior is cooperative.        Thought Content: Thought content normal.        Cognition and Memory: Cognition and memory normal.        Judgment: Judgment normal.      Results for orders placed or performed in visit on 03/31/24  Estradiol    Collection Time: 03/31/24  8:32 AM  Result Value Ref Range   Estradiol  <5.0 0.0 - 54.7 pg/mL  Lipid panel   Collection Time: 03/31/24  8:32 AM  Result Value Ref Range   Cholesterol, Total 226 (H) 100 - 199 mg/dL   Triglycerides 884 0 - 149 mg/dL   HDL 85 >60 mg/dL   VLDL Cholesterol Cal 20 5 - 40 mg/dL   LDL Chol Calc (NIH) 878 (H) 0 - 99 mg/dL   Chol/HDL Ratio 2.7 0.0 - 4.4  ratio  Comprehensive metabolic panel with GFR   Collection Time: 03/31/24  8:32 AM  Result Value Ref Range   Glucose 90 70 - 99 mg/dL   BUN 14 8 - 27 mg/dL   Creatinine, Ser 9.21 0.57 - 1.00 mg/dL   eGFR 87 >40 fO/fpw/8.26   BUN/Creatinine Ratio 18 12 -  28   Sodium 136 134 - 144 mmol/L   Potassium 4.2 3.5 - 5.2 mmol/L   Chloride 100 96 - 106 mmol/L   CO2 20 20 - 29 mmol/L   Calcium  9.6 8.7 - 10.3 mg/dL   Total Protein 6.8 6.0 - 8.5 g/dL   Albumin 4.5 3.8 - 4.9 g/dL   Globulin, Total 2.3 1.5 - 4.5 g/dL   Bilirubin Total 0.8 0.0 - 1.2 mg/dL   Alkaline Phosphatase 99 44 - 121 IU/L   AST 19 0 - 40 IU/L   ALT 13 0 - 32 IU/L  CBC with Differential/Platelet   Collection Time: 03/31/24  8:32 AM  Result Value Ref Range   WBC 4.7 3.4 - 10.8 x10E3/uL   RBC 4.29 3.77 - 5.28 x10E6/uL   Hemoglobin 13.9 11.1 - 15.9 g/dL   Hematocrit 58.4 65.9 - 46.6 %   MCV 97 79 - 97 fL   MCH 32.4 26.6 - 33.0 pg   MCHC 33.5 31.5 - 35.7 g/dL   RDW 86.9 88.2 - 84.5 %   Platelets 235 150 - 450 x10E3/uL   Neutrophils 46 Not Estab. %   Lymphs 41 Not Estab. %   Monocytes 10 Not Estab. %   Eos 3 Not Estab. %   Basos 0 Not Estab. %   Neutrophils Absolute 2.2 1.4 - 7.0 x10E3/uL   Lymphocytes Absolute 1.9 0.7 - 3.1 x10E3/uL   Monocytes Absolute 0.5 0.1 - 0.9 x10E3/uL   EOS (ABSOLUTE) 0.1 0.0 - 0.4 x10E3/uL   Basophils Absolute 0.0 0.0 - 0.2 x10E3/uL   Immature Granulocytes 0 Not Estab. %   Immature Grans (Abs) 0.0 0.0 - 0.1 x10E3/uL  Hemoglobin A1c   Collection Time: 03/31/24  8:32 AM  Result Value Ref Range   Hgb A1c MFr Bld 5.3 4.8 - 5.6 %   Est. average glucose Bld gHb Est-mCnc 105 mg/dL       Assessment & Plan:   Problem List Items Addressed This Visit     Essential hypertension   Blood pressure readings at home are low, suggesting possible overmedication. Considering discontinuation of valsartan  due to low blood pressure and prediabetes status. - Discontinue valsartan . - Monitor blood  pressure regularly and report any significant changes.      Relevant Medications   tirzepatide  10 MG/0.5ML injection vial   Obesity   Weight decreased to 168 lbs, with a goal of 155 lbs. Current weight management supported by Zepbound , which is well-tolerated. Considering increasing Zepbound  dose to support weight loss goals. - Increase Zepbound  dose to 10 mg monthly. - Coordinate with [employer] for Zepbound  supply and cost management.      Relevant Medications   tirzepatide  10 MG/0.5ML injection vial   Menopause   Experiencing menopausal symptoms including joint pain, decreased libido, and dyspareunia. Reports deep itching, not typically associated with menopause or hormone changes. Considering HRT to improve quality of life. Discussed HRT risks, including cardiovascular and breast cancer risks. Prefers oral HRT. - Prescribe low-dose oral estrogen and progesterone  therapy. - Advise to report any vaginal bleeding immediately. - Monitor for any signs of heart disease, stroke, high blood pressure, or blood clots.       Relevant Medications   Estradiol -Progesterone  0.5-100 MG CAPS   Generalized pruritus   Reports deep, generalized itching without rash, possibly related to hormonal changes or environmental factors. Continuing cetirizine for symptom management. Monitoring response to hormone therapy for impact on itching. - Continue cetirizine. - Monitor response to hormone therapy  for potential improvement in itching.      Mixed hyperlipidemia   Cholesterol levels have increased slightly but remain lower than pre-Zepbound  levels. Summer dietary habits may have contributed to this increase. Overall cholesterol management is satisfactory.      Hearing difficulty of both ears   Experiencing difficulty hearing in noisy environments and intermittent tinnitus. Considering hearing evaluation as insurance deductible is nearly met. - Refer for hearing evaluation.      Relevant Orders    Ambulatory referral to Audiology   Pre-diabetes   Relevant Medications   tirzepatide  10 MG/0.5ML injection vial   Encounter for annual physical exam - Primary   Other Visit Diagnoses       Screening mammogram for breast cancer       Relevant Orders   MM 3D SCREENING MAMMOGRAM BILATERAL BREAST         Follow up plan: Return in about 1 year (around 04/03/2025) for CPE.  NEXT PREVENTATIVE PHYSICAL DUE IN 1 YEAR.  PATIENT COUNSELING PROVIDED FOR ALL ADULT PATIENTS: A well balanced diet low in saturated fats, cholesterol, and moderation in carbohydrates.  This can be as simple as monitoring portion sizes and cutting back on sugary beverages such as soda and juice to start with.    Daily water consumption of at least 64 ounces.  Physical activity at least 180 minutes per week.  If just starting out, start 10 minutes a day and work your way up.   This can be as simple as taking the stairs instead of the elevator and walking 2-3 laps around the office  purposefully every day.   STD protection, partner selection, and regular testing if high risk.  Limited consumption of alcoholic beverages if alcohol is consumed. For men, I recommend no more than 14 alcoholic beverages per week, spread out throughout the week (max 2 per day). Avoid binge drinking or consuming large quantities of alcohol in one setting.  Please let me know if you feel you may need help with reduction or quitting alcohol consumption.   Avoidance of nicotine, if used. Please let me know if you feel you may need help with reduction or quitting nicotine use.   Daily mental health attention. This can be in the form of 5 minute daily meditation, prayer, journaling, yoga, reflection, etc.  Purposeful attention to your emotions and mental state can significantly improve your overall wellbeing  and  Health.  Please know that I am here to help you with all of your health care goals and am happy to work with you to find a  solution that works best for you.  The greatest advice I have received with any changes in life are to take it one step at a time, that even means if all you can focus on is the next 60 seconds, then do that and celebrate your victories.  With any changes in life, you will have set backs, and that is OK. The important thing to remember is, if you have a set back, it is not a failure, it is an opportunity to try again! Screening Testing Mammogram Every 1 -2 years based on history and risk factors Starting at age 61 Pap Smear Ages 21-39 every 3 years Ages 39-65 every 5 years with HPV testing More frequent testing may be required based on results and history Colon Cancer Screening Every 1-10 years based on test performed, risk factors, and history Starting at age 47 Bone Density Screening Every 2-10 years based on history  Starting at age 15 for women Recommendations for men differ based on medication usage, history, and risk factors AAA Screening One time ultrasound Men 74-79 years old who have every smoked Lung Cancer Screening Low Dose Lung CT every 12 months Age 55-80 years with a 30 pack-year smoking history who still smoke or who have quit within the last 15 years   Screening Labs Routine  Labs: Complete Blood Count (CBC), Complete Metabolic Panel (CMP), Cholesterol (Lipid Panel) Every 6-12 months based on history and medications May be recommended more frequently based on current conditions or previous results Hemoglobin A1c Lab Every 3-12 months based on history and previous results Starting at age 5 or earlier with diagnosis of diabetes, high cholesterol, BMI >26, and/or risk factors Frequent monitoring for patients with diabetes to ensure blood sugar control Thyroid  Panel (TSH) Every 6 months based on history, symptoms, and risk factors May be repeated more often if on medication HIV One time testing for all patients 3 and older May be repeated more frequently for  patients with increased risk factors or exposure Hepatitis C One time testing for all patients 57 and older May be repeated more frequently for patients with increased risk factors or exposure Gonorrhea, Chlamydia Every 12 months for all sexually active persons 13-24 years Additional monitoring may be recommended for those who are considered high risk or who have symptoms Every 12 months for any woman on birth control, regardless of sexual activity PSA Men 34-82 years old with risk factors Additional screening may be recommended from age 63-69 based on risk factors, symptoms, and history  Vaccine Recommendations Tetanus Booster All adults every 10 years Flu Vaccine All patients 6 months and older every year COVID Vaccine All patients 12 years and older Initial dosing with booster May recommend additional booster based on age and health history HPV Vaccine 2 doses all patients age 67-26 Dosing may be considered for patients over 26 Shingles Vaccine (Shingrix) 2 doses all adults 55 years and older Pneumonia (Pneumovax 56) All adults 65 years and older May recommend earlier dosing based on health history One year apart from Prevnar 47 Pneumonia (Prevnar 8) All adults 65 years and older Dosed 1 year after Pneumovax 23 Pneumonia (Prevnar 20) One time alternative to the two dosing of 13 and 23 For all adults with initial dose of 23, 20 is recommended 1 year later For all adults with initial dose of 13, 23 is still recommended as second option 1 year later

## 2024-04-03 NOTE — Assessment & Plan Note (Signed)
 Cholesterol levels have increased slightly but remain lower than pre-Zepbound  levels. Summer dietary habits may have contributed to this increase. Overall cholesterol management is satisfactory.

## 2024-04-03 NOTE — Assessment & Plan Note (Signed)
 Experiencing difficulty hearing in noisy environments and intermittent tinnitus. Considering hearing evaluation as insurance deductible is nearly met. - Refer for hearing evaluation.

## 2024-04-03 NOTE — Assessment & Plan Note (Signed)
 Weight decreased to 168 lbs, with a goal of 155 lbs. Current weight management supported by Zepbound , which is well-tolerated. Considering increasing Zepbound  dose to support weight loss goals. - Increase Zepbound  dose to 10 mg monthly. - Coordinate with [employer] for Zepbound  supply and cost management.

## 2024-04-05 ENCOUNTER — Other Ambulatory Visit (HOSPITAL_COMMUNITY): Payer: Self-pay

## 2024-04-10 ENCOUNTER — Encounter: Payer: Self-pay | Admitting: Nurse Practitioner

## 2024-04-12 ENCOUNTER — Other Ambulatory Visit: Payer: Self-pay | Admitting: Nurse Practitioner

## 2024-04-27 ENCOUNTER — Other Ambulatory Visit (HOSPITAL_COMMUNITY): Payer: Self-pay

## 2024-04-27 ENCOUNTER — Other Ambulatory Visit: Payer: Self-pay

## 2024-04-27 ENCOUNTER — Other Ambulatory Visit: Payer: Self-pay | Admitting: Nurse Practitioner

## 2024-04-27 DIAGNOSIS — Z0000 Encounter for general adult medical examination without abnormal findings: Secondary | ICD-10-CM

## 2024-04-27 MED ORDER — VALACYCLOVIR HCL 1 G PO TABS
1000.0000 mg | ORAL_TABLET | Freq: Every day | ORAL | 3 refills | Status: AC
  Filled 2024-04-27 – 2024-09-15 (×2): qty 90, 90d supply, fill #0

## 2024-05-04 ENCOUNTER — Ambulatory Visit

## 2024-05-16 ENCOUNTER — Ambulatory Visit
Admission: RE | Admit: 2024-05-16 | Discharge: 2024-05-16 | Disposition: A | Discharge status: Home / Self Care | Source: Ambulatory Visit | Attending: Nurse Practitioner | Admitting: Nurse Practitioner

## 2024-05-16 DIAGNOSIS — Z1231 Encounter for screening mammogram for malignant neoplasm of breast: Secondary | ICD-10-CM

## 2024-05-17 ENCOUNTER — Ambulatory Visit: Payer: Self-pay | Admitting: Nurse Practitioner

## 2024-05-18 ENCOUNTER — Other Ambulatory Visit (HOSPITAL_COMMUNITY): Payer: Self-pay

## 2024-05-18 MED ORDER — COVID-19 MRNA VAC-TRIS(PFIZER) 30 MCG/0.3ML IM SUSY
0.3000 mL | PREFILLED_SYRINGE | INTRAMUSCULAR | 0 refills | Status: AC
  Filled 2024-05-18: qty 0.3, 1d supply, fill #0

## 2024-05-19 ENCOUNTER — Other Ambulatory Visit (HOSPITAL_BASED_OUTPATIENT_CLINIC_OR_DEPARTMENT_OTHER): Payer: Self-pay

## 2024-05-26 DIAGNOSIS — F419 Anxiety disorder, unspecified: Secondary | ICD-10-CM | POA: Diagnosis not present

## 2024-05-29 ENCOUNTER — Ambulatory Visit: Attending: Nurse Practitioner | Admitting: Audiologist

## 2024-05-29 DIAGNOSIS — H903 Sensorineural hearing loss, bilateral: Secondary | ICD-10-CM | POA: Diagnosis not present

## 2024-05-29 NOTE — Procedures (Signed)
  Outpatient Audiology and [facility] [address] [place], [place]  [address] [phone]  AUDIOLOGICAL  EVALUATION  NAME: Sheila Nelson     DOB:   [birth year]      MRN: [id number]                                                                                     DATE: 05/29/2024     REFERENT: Oris Camie BRAVO, NP STATUS: Outpatient DIAGNOSIS: Sensorineural Hearing Loss    History: Melanny was seen for an audiological evaluation due to difficulty hearing in noise and her husband mumbling. [place] feels she can her but not always understand people. 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 She uses Loop earplugs. She hears well with those in.   [place] denies pain, pressure, or tinnitus.  [place] no other significant history of hazardous noise exposure.  Medical history shows no additional risk for hearing loss.    Evaluation:  Otoscopy showed a clear view of the tympanic membranes, bilaterally Tympanometry results were consistent with normal middle ear function, bilaterally   Audiometric testing was completed using Conventional Audiometry techniques with insert earphones and supraural headphones. Test results are consistent with normal hearing 250-3kHz sloping to mild to moderate high pitched sensorineural hearing loss. Speech Recognition Thresholds were obtained at 10 dB HL in the right ear and at 15 dB HL in the left ear. Word Recognition Testing was completed at  40dB SL and [place] scored 100% in both ears.    Results:  The test results were reviewed with [place]. She has a sloping mild high frequency sensorineural hearing loss in both ears. She will miss some high pitched consonants. She can try OTC aids like airpods. She declined. She has adjusted and tries not to work in loud environments anymore.  Audiogram printed and provided to [place].   Recommendations: Annual audiometric testing recommended to monitor hearing loss for progression.   28  minutes spent testing and counseling on results.   If you have any questions please feel free to contact me at [phone].  Lauraine Ka Stalnaker Au.D.  Audiologist   05/29/2024  3:27 PM  Cc: Early, Sara E, NP

## 2024-05-31 ENCOUNTER — Other Ambulatory Visit: Payer: Self-pay

## 2024-05-31 ENCOUNTER — Other Ambulatory Visit (HOSPITAL_COMMUNITY): Payer: Self-pay

## 2024-06-06 ENCOUNTER — Encounter: Payer: Self-pay | Admitting: Nurse Practitioner

## 2024-06-07 ENCOUNTER — Ambulatory Visit
Admission: RE | Admit: 2024-06-07 | Discharge: 2024-06-07 | Disposition: A | Discharge status: Home / Self Care | Source: Ambulatory Visit | Attending: Family Medicine | Admitting: Family Medicine

## 2024-06-07 ENCOUNTER — Encounter: Payer: Self-pay | Admitting: Family Medicine

## 2024-06-07 ENCOUNTER — Ambulatory Visit: Admitting: Family Medicine

## 2024-06-07 ENCOUNTER — Other Ambulatory Visit (HOSPITAL_COMMUNITY): Payer: Self-pay

## 2024-06-07 VITALS — BP 120/78 | HR 76 | Wt 170.6 lb

## 2024-06-07 DIAGNOSIS — M898X6 Other specified disorders of bone, lower leg: Secondary | ICD-10-CM

## 2024-06-07 DIAGNOSIS — M65969 Unspecified synovitis and tenosynovitis, unspecified lower leg: Secondary | ICD-10-CM | POA: Diagnosis not present

## 2024-06-07 DIAGNOSIS — M79661 Pain in right lower leg: Secondary | ICD-10-CM | POA: Diagnosis not present

## 2024-06-07 DIAGNOSIS — M25561 Pain in right knee: Secondary | ICD-10-CM | POA: Diagnosis not present

## 2024-06-07 DIAGNOSIS — M1711 Unilateral primary osteoarthritis, right knee: Secondary | ICD-10-CM | POA: Diagnosis not present

## 2024-06-07 DIAGNOSIS — S8991XA Unspecified injury of right lower leg, initial encounter: Secondary | ICD-10-CM | POA: Diagnosis not present

## 2024-06-07 MED ORDER — METHYLPREDNISOLONE 4 MG PO TBPK
ORAL_TABLET | ORAL | 0 refills | Status: AC
  Filled 2024-06-07: qty 21, 6d supply, fill #0

## 2024-06-07 MED ORDER — TRAMADOL HCL 50 MG PO TABS
50.0000 mg | ORAL_TABLET | Freq: Three times a day (TID) | ORAL | 0 refills | Status: AC | PRN
  Filled 2024-06-07: qty 15, 5d supply, fill #0

## 2024-06-07 NOTE — Progress Notes (Unsigned)
 Name: Richerd GORMAN Plana   Date of Visit: 06/07/24   Date of last visit with me: Visit date not found   CHIEF COMPLAINT:  Chief Complaint  Patient presents with  . other    Rt. Lower leg pain, started with shin splints two weeks ago that went away, Friday did a big hike, feels like it is on fire now, light bit of swelling lower leg and just below the knee,        HPI:  Discussed the use of AI scribe software for clinical note transcription with the patient, who gave verbal consent to proceed.  History of Present Illness   GWENDLOYN FORSEE is a 63 year old female who presents with worsening right leg pain after a hiking trip.  She has been experiencing worsening pain in her right leg, initially presenting as shin splints approximately two to three weeks ago. Rest initially resolved the symptoms, but following a seven to seven and a half mile hike during a camping and fishing trip last Friday, the pain intensified and spread from the shin to the knee area.  The pain is described as burning and aching, with 'hot spots' along the shin and at the base of the knee. It is persistent, occurring even at rest. Icing the area provides some relief. She has been taking meloxicam  as needed, but it has not been effective in alleviating the pain. Additionally, she takes 500 mg of Tylenol  at night, which also provides minimal relief.  She notes a sensation of tightness and swelling in the affected area, although it does not appear significantly swollen compared to her other leg. No recent falls or injuries during the hike were noted, and this was the first trip in a long time without such incidents.  Her daily routine includes walking, and she has been trying to increase her activity level. Despite this, she has not engaged in weight training. She typically walks daily and can handle five to six mile hikes without significant issues, although she might experience mild soreness the following day.  The  pain feels deep along the bone and is aggravated by having her pants touch the skin. She also experiences occasional electrical shock-like sensations in the area. She has been icing the leg frequently and has significantly reduced her walking activities, limiting them to essential movements such as taking the dog out.  She works in [employer], which involves sitting at a desk, but she needs to drive to work. She has been considering working remotely due to the pain.         OBJECTIVE:       04/03/2024    1:27 PM  Depression screen PHQ 2/9  Decreased Interest 0  Down, Depressed, Hopeless 0  PHQ - 2 Score 0     BP Readings from Last 3 Encounters:  06/07/24 120/78  04/03/24 118/76  02/16/23 112/76    BP 120/78   Pulse 76   Wt 170 lb 9.6 oz (77.4 kg)   LMP 09/19/2011   BMI 29.28 kg/m         Physical Exam  Inspection reveals no gross abnormality of the right skin.  There is tenderness to palpation throughout the midportion of the shin all the way superiorly to the tibialis anterior on the lateral aspect of the tibial condyle. Pain noted with dorsiflexion.  ASSESSMENT/PLAN:   Assessment & Plan Tibial pain  Tibialis anterior tenosynovitis    Assessment and Plan    Right tibialis  anterior muscle tear with inflammation Confirmed multiple tears in the tibialis anterior muscle via ultrasound. Likely due to overuse. Concern for possible tibial stress fracture.  - Order x-rays at [facility] to rule out stress fracture. - Prescribe Medrol  Dosepak for inflammation, tapering dose over several days. - Advise continuation of meloxicam  after steroid course, with a three-day gap. - Recommend icing four times daily for twenty minutes each session. - Advise minimal weight-bearing activities, consider crutches for mobility. - Discuss tramadol  for pain management, prescribe 50 mg tablets as needed. - Schedule follow-up in two weeks to reassess and consider further  imaging. - Advise working remotely until Monday to minimize driving and weight-bearing.         Garnett Rekowski A. Vita MD [facility]

## 2024-06-08 ENCOUNTER — Ambulatory Visit: Payer: Self-pay | Admitting: Family Medicine

## 2024-06-21 ENCOUNTER — Encounter: Payer: Self-pay | Admitting: Family Medicine

## 2024-06-21 ENCOUNTER — Other Ambulatory Visit (HOSPITAL_COMMUNITY): Payer: Self-pay

## 2024-06-21 ENCOUNTER — Ambulatory Visit: Admitting: Family Medicine

## 2024-06-21 VITALS — BP 114/72 | HR 63 | Wt 167.0 lb

## 2024-06-21 DIAGNOSIS — M65969 Unspecified synovitis and tenosynovitis, unspecified lower leg: Secondary | ICD-10-CM | POA: Diagnosis not present

## 2024-06-21 DIAGNOSIS — G8929 Other chronic pain: Secondary | ICD-10-CM

## 2024-06-21 DIAGNOSIS — M25571 Pain in right ankle and joints of right foot: Secondary | ICD-10-CM

## 2024-06-21 DIAGNOSIS — M170 Bilateral primary osteoarthritis of knee: Secondary | ICD-10-CM | POA: Diagnosis not present

## 2024-06-21 MED ORDER — MELOXICAM 15 MG PO TABS
15.0000 mg | ORAL_TABLET | Freq: Every day | ORAL | 0 refills | Status: AC
  Filled 2024-06-21: qty 30, 30d supply, fill #0

## 2024-06-21 NOTE — Progress Notes (Signed)
   Name: Sheila Nelson   Date of Visit: 06/21/24   Date of last visit with me: 06/07/2024   CHIEF COMPLAINT:  Chief Complaint  Patient presents with   Follow-up    2 week follow up for leg pain. Doing much better still a little bit sore.        HPI:  Discussed the use of AI scribe software for clinical note transcription with the patient, who gave verbal consent to proceed.  History of Present Illness   Sheila Nelson is a 63 year old female who presents with knee discomfort and clicking sounds.  She experiences discomfort primarily at the front of her knee, where she believes fluid is collecting. There is a 'thunking and clicking' sensation, especially when crossing her leg or moving it side to side. No locking or feeling of the knee getting stuck is reported.  She has a history of anterior shin pain due to tears in the tibialis anterior muscle from overuse, likely from a long hike. She recalls that previous x-rays were performed on her knee.  Pain is also present on the outside of her knee. She uses a knee compression sleeve but finds it uncomfortable as it rolls down and pinches behind her knee. She last took meloxicam  three days ago and found the steroids she was prescribed to be 'awful'.  She dislikes going to the gym. She mentioned that since taking Zepbound , she feels she has lost muscle mass, attributing this to the medication and her age. She is close to her goal weight of 140-150 pounds, having lost weight recently, which has affected the fit of her knee compression sleeve.         OBJECTIVE:       04/03/2024    1:27 PM  Depression screen PHQ 2/9  Decreased Interest 0  Down, Depressed, Hopeless 0  PHQ - 2 Score 0     BP Readings from Last 3 Encounters:  06/21/24 114/72  06/07/24 120/78  04/03/24 118/76    BP 114/72   Pulse 63   Wt 167 lb (75.8 kg)   LMP 09/19/2011   BMI 28.67 kg/m    Physical Exam          Physical Exam  Inspection reveals  some mild swelling of the right knee.  Mild tenderness to palpation over the tibialis anterior origin.  Range of motion is full. ASSESSMENT/PLAN:   Assessment & Plan Tibialis anterior tenosynovitis  Primary osteoarthritis of both knees  Chronic pain of right ankle    Assessment and Plan    Degenerative joint disease of right knee Mild degenerative changes with possible meniscus tear. Pain not severe enough for surgery. - Refer to outpatient physical therapy for strength training. - Advise use of knee compression sleeve. - Recommend progressive overload strength exercises. - Continue meloxicam  as needed for inflammation, short course if pain is inflammatory. - Use ice for pain management. - Encourage protein intake of 110-120 grams per day.  Tibialis anterior muscle strain, right leg Strain due to overuse with anterior pain related to muscle insertion inflammation. - Advise against activities causing more than 4/10 pain.  General Health Maintenance Strength training essential for aging and muscle mass maintenance. - Encourage strength training at least 120 minutes per week. - Advise on protein supplementation to meet daily protein goals.         Preet Perrier A. Vita MD [facility]

## 2024-07-03 ENCOUNTER — Encounter: Payer: Self-pay | Admitting: Radiology

## 2024-07-04 ENCOUNTER — Other Ambulatory Visit: Payer: Self-pay

## 2024-07-04 ENCOUNTER — Other Ambulatory Visit (HOSPITAL_BASED_OUTPATIENT_CLINIC_OR_DEPARTMENT_OTHER): Payer: Self-pay | Admitting: Nurse Practitioner

## 2024-07-04 ENCOUNTER — Other Ambulatory Visit: Payer: Self-pay | Admitting: Nurse Practitioner

## 2024-07-04 ENCOUNTER — Other Ambulatory Visit (HOSPITAL_COMMUNITY): Payer: Self-pay

## 2024-07-04 DIAGNOSIS — Z780 Asymptomatic menopausal state: Secondary | ICD-10-CM

## 2024-07-04 MED ORDER — FLUTICASONE PROPIONATE 50 MCG/ACT NA SUSP
2.0000 | Freq: Every day | NASAL | 1 refills | Status: AC
  Filled 2024-07-04: qty 16, 30d supply, fill #0

## 2024-07-04 NOTE — Telephone Encounter (Signed)
 Is this okay to refill?

## 2024-07-06 ENCOUNTER — Other Ambulatory Visit (HOSPITAL_COMMUNITY): Payer: Self-pay

## 2024-07-06 ENCOUNTER — Other Ambulatory Visit: Payer: Self-pay | Admitting: Nurse Practitioner

## 2024-07-06 DIAGNOSIS — Z780 Asymptomatic menopausal state: Secondary | ICD-10-CM

## 2024-07-06 MED ORDER — BIJUVA 0.5-100 MG PO CAPS
1.0000 | ORAL_CAPSULE | Freq: Every day | ORAL | 2 refills | Status: DC
  Filled 2024-07-06: qty 30, 30d supply, fill #0
  Filled 2024-07-29 – 2024-07-31 (×2): qty 30, 30d supply, fill #1
  Filled 2024-09-11: qty 30, 30d supply, fill #2

## 2024-07-07 ENCOUNTER — Other Ambulatory Visit (HOSPITAL_COMMUNITY): Payer: Self-pay

## 2024-07-29 ENCOUNTER — Other Ambulatory Visit (HOSPITAL_COMMUNITY): Payer: Self-pay

## 2024-07-31 ENCOUNTER — Other Ambulatory Visit: Payer: Self-pay

## 2024-08-01 ENCOUNTER — Other Ambulatory Visit (HOSPITAL_COMMUNITY): Payer: Self-pay

## 2024-08-03 ENCOUNTER — Encounter: Payer: Self-pay | Admitting: Nurse Practitioner

## 2024-08-07 ENCOUNTER — Encounter: Payer: Self-pay | Admitting: Nurse Practitioner

## 2024-08-07 ENCOUNTER — Other Ambulatory Visit: Payer: Self-pay

## 2024-08-07 DIAGNOSIS — R7303 Prediabetes: Secondary | ICD-10-CM

## 2024-08-07 DIAGNOSIS — I10 Essential (primary) hypertension: Secondary | ICD-10-CM

## 2024-08-07 DIAGNOSIS — E6609 Other obesity due to excess calories: Secondary | ICD-10-CM

## 2024-08-07 MED ORDER — TIRZEPATIDE-WEIGHT MANAGEMENT 10 MG/0.5ML ~~LOC~~ SOLN: 10.0000 mg | SUBCUTANEOUS | 2 refills | Status: AC

## 2024-08-09 ENCOUNTER — Other Ambulatory Visit: Payer: Self-pay

## 2024-08-09 DIAGNOSIS — Z8249 Family history of ischemic heart disease and other diseases of the circulatory system: Secondary | ICD-10-CM

## 2024-09-11 ENCOUNTER — Other Ambulatory Visit (HOSPITAL_COMMUNITY): Payer: Self-pay

## 2024-09-12 ENCOUNTER — Encounter (HOSPITAL_COMMUNITY): Payer: Self-pay

## 2024-09-12 ENCOUNTER — Other Ambulatory Visit (HOSPITAL_COMMUNITY): Payer: Self-pay

## 2024-09-15 ENCOUNTER — Other Ambulatory Visit (HOSPITAL_COMMUNITY): Payer: Self-pay

## 2024-09-15 ENCOUNTER — Other Ambulatory Visit: Payer: Self-pay

## 2024-09-18 ENCOUNTER — Encounter: Payer: Self-pay | Admitting: Nurse Practitioner

## 2024-09-18 DIAGNOSIS — Z79890 Hormone replacement therapy (postmenopausal): Secondary | ICD-10-CM

## 2024-09-18 DIAGNOSIS — Z780 Asymptomatic menopausal state: Secondary | ICD-10-CM

## 2024-09-22 ENCOUNTER — Encounter (HOSPITAL_COMMUNITY): Payer: Self-pay

## 2024-09-22 ENCOUNTER — Other Ambulatory Visit: Payer: Self-pay

## 2024-09-22 ENCOUNTER — Other Ambulatory Visit (HOSPITAL_COMMUNITY): Payer: Self-pay

## 2024-09-22 MED ORDER — PROGESTERONE MICRONIZED 100 MG PO CAPS
100.0000 mg | ORAL_CAPSULE | Freq: Every day | ORAL | 3 refills | Status: AC
  Filled 2024-09-22 (×2): qty 90, 90d supply, fill #0

## 2024-09-22 MED ORDER — ESTRADIOL 0.5 MG PO TABS
0.5000 mg | ORAL_TABLET | Freq: Every day | ORAL | 3 refills | Status: AC
  Filled 2024-09-22 (×2): qty 90, 90d supply, fill #0

## 2024-10-25 ENCOUNTER — Ambulatory Visit: Admitting: Cardiology

## 2025-04-17 ENCOUNTER — Encounter: Payer: Self-pay | Admitting: Nurse Practitioner
